# Patient Record
Sex: Female | Born: 1935 | Race: White | Hispanic: No | Smoking: Never smoker
Health system: Southern US, Community
[De-identification: ages and names within clinical notes are randomized; demographics above are authoritative.]

## PROBLEM LIST (undated history)

## (undated) DIAGNOSIS — R627 Adult failure to thrive: Secondary | ICD-10-CM

## (undated) DIAGNOSIS — I10 Essential (primary) hypertension: Secondary | ICD-10-CM

## (undated) DIAGNOSIS — R0602 Shortness of breath: Secondary | ICD-10-CM

## (undated) DIAGNOSIS — F32A Depression, unspecified: Secondary | ICD-10-CM

## (undated) DIAGNOSIS — W19XXXA Unspecified fall, initial encounter: Secondary | ICD-10-CM

## (undated) DIAGNOSIS — I639 Cerebral infarction, unspecified: Secondary | ICD-10-CM

## (undated) DIAGNOSIS — I4891 Unspecified atrial fibrillation: Secondary | ICD-10-CM

## (undated) DIAGNOSIS — J189 Pneumonia, unspecified organism: Secondary | ICD-10-CM

## (undated) DIAGNOSIS — I499 Cardiac arrhythmia, unspecified: Secondary | ICD-10-CM

## (undated) DIAGNOSIS — M1990 Unspecified osteoarthritis, unspecified site: Secondary | ICD-10-CM

## (undated) DIAGNOSIS — F0391 Unspecified dementia with behavioral disturbance: Secondary | ICD-10-CM

## (undated) DIAGNOSIS — K219 Gastro-esophageal reflux disease without esophagitis: Secondary | ICD-10-CM

## (undated) DIAGNOSIS — N179 Acute kidney failure, unspecified: Secondary | ICD-10-CM

## (undated) DIAGNOSIS — F329 Major depressive disorder, single episode, unspecified: Secondary | ICD-10-CM

## (undated) DIAGNOSIS — D649 Anemia, unspecified: Secondary | ICD-10-CM

## (undated) DIAGNOSIS — I951 Orthostatic hypotension: Secondary | ICD-10-CM

## (undated) HISTORY — PX: BACK SURGERY: SHX140

## (undated) HISTORY — PX: OTHER SURGICAL HISTORY: SHX169

---

## 2009-09-22 ENCOUNTER — Emergency Department (HOSPITAL_COMMUNITY): Admission: EM | Admit: 2009-09-22 | Discharge: 2009-09-22 | Payer: Self-pay | Admitting: Emergency Medicine

## 2009-10-01 ENCOUNTER — Ambulatory Visit (HOSPITAL_COMMUNITY): Admission: RE | Admit: 2009-10-01 | Discharge: 2009-10-01 | Payer: Self-pay | Admitting: Family Medicine

## 2009-11-04 ENCOUNTER — Inpatient Hospital Stay (HOSPITAL_COMMUNITY): Admission: EM | Admit: 2009-11-04 | Discharge: 2009-11-09 | Payer: Self-pay | Admitting: Emergency Medicine

## 2009-11-06 ENCOUNTER — Ambulatory Visit: Payer: Self-pay | Admitting: Gastroenterology

## 2009-11-08 ENCOUNTER — Ambulatory Visit: Payer: Self-pay | Admitting: Gastroenterology

## 2009-11-08 ENCOUNTER — Encounter (INDEPENDENT_AMBULATORY_CARE_PROVIDER_SITE_OTHER): Payer: Self-pay | Admitting: Family Medicine

## 2009-11-17 ENCOUNTER — Encounter (INDEPENDENT_AMBULATORY_CARE_PROVIDER_SITE_OTHER): Payer: Self-pay

## 2009-12-01 ENCOUNTER — Encounter (INDEPENDENT_AMBULATORY_CARE_PROVIDER_SITE_OTHER): Payer: Self-pay

## 2009-12-30 ENCOUNTER — Encounter: Payer: Self-pay | Admitting: Gastroenterology

## 2010-01-19 ENCOUNTER — Ambulatory Visit: Payer: Self-pay | Admitting: Gastroenterology

## 2010-01-19 DIAGNOSIS — D649 Anemia, unspecified: Secondary | ICD-10-CM | POA: Insufficient documentation

## 2010-01-19 DIAGNOSIS — K279 Peptic ulcer, site unspecified, unspecified as acute or chronic, without hemorrhage or perforation: Secondary | ICD-10-CM | POA: Insufficient documentation

## 2010-01-19 DIAGNOSIS — R197 Diarrhea, unspecified: Secondary | ICD-10-CM | POA: Insufficient documentation

## 2010-01-28 ENCOUNTER — Ambulatory Visit: Payer: Self-pay | Admitting: Gastroenterology

## 2010-01-28 ENCOUNTER — Ambulatory Visit (HOSPITAL_COMMUNITY): Admission: RE | Admit: 2010-01-28 | Discharge: 2010-01-28 | Payer: Self-pay | Admitting: Gastroenterology

## 2010-05-12 ENCOUNTER — Ambulatory Visit (HOSPITAL_COMMUNITY)
Admission: RE | Admit: 2010-05-12 | Discharge: 2010-05-12 | Payer: Self-pay | Source: Home / Self Care | Attending: Family Medicine | Admitting: Family Medicine

## 2010-06-14 NOTE — Letter (Signed)
Summary: Plan of Care, Need to Discuss  [facility]  [address]   [place], [place] [address]   Phone: [phone]  Fax: [phone]    December 01, 2009  Sheri Matthews [address] [place], [place]  [address] 11/16/1923   Dear Ms. Sheri Matthews,   We are writing this letter to inform you of treatment plans and/or discuss your plan of care.  We have tried several times to contact you; however, we have yet to reach you.  We ask that you please contact our office for follow-up on your gastrointestinal issues.  We can  be reached at [phone] to schedule an appointment, or to speak with someone regarding your health care needs.  Please do not neglect your health.   Sincerely,    Cloria Spring LPN  [facility] Ph: [phone]    Fax: [phone]

## 2010-06-14 NOTE — Letter (Signed)
Summary: Plan of Care, Need to Discuss  [facility]  [address]   [place], [place] [address]   Phone: [phone]  Fax: [phone]    November 17, 2009  Sheri Matthews [address] [place], [place]  [address] 12/25/1923   Dear Ms. Sheri Matthews,   We are writing this letter to inform you of treatment plans and/or discuss your plan of care.  We have tried several times to contact you; however, we have yet to reach you.  We ask that you please contact our office for follow-up on your gastrointestinal issues.  We can  be reached at [phone] to schedule an appointment, or to speak with someone regarding your health care needs.  Please do not neglect your health.   Sincerely,    Cloria Spring LPN  [facility] Ph: [phone]    Fax: [phone]

## 2010-06-14 NOTE — Letter (Signed)
Summary: CONSULTATION  CONSULTATION   Imported By: Rexene Alberts 12/30/2009 10:28:26  _____________________________________________________________________  External Attachment:    Type:   Image     Comment:   External Document

## 2010-06-14 NOTE — Letter (Signed)
Summary: EGD ORDER  EGD ORDER   Imported By: Ave Filter 01/19/2010 11:53:13  _____________________________________________________________________  External Attachment:    Type:   Image     Comment:   External Document

## 2010-06-14 NOTE — Assessment & Plan Note (Signed)
Summary: PUD, DIARRHEA   Visit Type:  Follow-up Visit Primary Care Provider:  Janna Arch, M.D.   Chief Complaint:  hosp follow up-doing good.  History of Present Illness: Last labs AUG. No BRBPR or melena. No nausea, vomiting, or pain. Eating okay. No problems swallowing.  Was down to 125 lbs but put some back on. 2 bouts of diarrhea and required anti-diarrheal meds.  Preventive Screening-Counseling & Management  Alcohol-Tobacco     Smoking Status: never  Current Medications (verified): 1)  Pantoprazole Sodium 40 Mg Tbec (Pantoprazole Sodium) .... Once Daily 2)  Lorazepam 1 Mg Tabs (Lorazepam) .... At Bedtime 3)  Donepezil Hcl 10 Mg Tabs (Donepezil Hcl) .... At Bedtime 4)  Simvastatin 40 Mg Tabs (Simvastatin) .... Once Daily 5)  Benazepril Hcl 20 Mg Tabs (Benazepril Hcl) .... Two Times A Day 6)  Amlodipine Besylate 5 Mg Tabs (Amlodipine Besylate) .... Once Daily 7)  Metoprolol Tartrate 50 Mg Tabs (Metoprolol Tartrate) .... Two Times A Day 8)  Fluoxetine Hcl 20 Mg Caps (Fluoxetine Hcl) .... Once Daily  Allergies (verified): No Known Drug Allergies  Past History:  Past Medical History: ANEMIA on ASA and Naproxen **JUNE 2011 TCS/EGD-PUD, HH, DISTAL PEPTIC STRICTURE, [place] TICS, IH, TORTUOUS COLON, NSAID COLOPATHY Hypertension HA TIA VERTIGO BELL'S PALSY  Past Surgical History: Cataract Extraction ONE OVARY REMOVED  Family History: No FH of Colon Cancer OR POLYPS  Social History: Patient has never smoked.  Alcohol Use - no LIVES WITH DAUGHTER Smoking Status:  never  Review of Systems       AUG 2011-WBC 7.6 HB 12.2 PLT 356 HFP NL  Vital Signs:  Patient profile:   77 year old female Height:      61 inches Weight:      132 pounds Temp:     98.2 degrees F oral Pulse rate:   76 / minute BP sitting:   112 / 68  (left arm) Cuff size:   regular  Vitals Entered By: Hendricks Limes LPN (January 19, 2010 11:21 AM)  Physical Exam  General:  Well developed, well  nourished, no acute distress. Head:  Normocephalic and atraumatic. Lungs:  Clear throughout to auscultation. Heart:  Regular rate and rhythm; no murmurs. Abdomen:  Soft, nontender and nondistended. Normal bowel sounds. Extremities:  Noedema noted.  Impression & Recommendations:  Problem # 1:  ANEMIA (ICD-285.9) 2o to PUD. EGD in SEP 2011. Pt may use ASA 81 mg daily if continues using pantoprazole. Upper endoscopy in SEP 2011. Follow up in 6 mos.  Problem # 2:  DIARRHEA (ICD-787.91) Assessment: Improved Intermittent likely 2o to lactose intoleramce or dietary choices. Minimize dairy.  CC: PCP  Patient Instructions: 1)  Pt may use ASA 81 mg daily if continues using pantoprazole. 2)  Upper endoscopy in SEP 2011. 3)  Minimize dairy. 4)  Follow up in 6 mos. 5)  The medication list was reviewed and reconciled.  All changed / newly prescribed medications were explained.  A complete medication list was provided to the patient / caregiver.  Appended Document: PUD, DIARRHEA 6 MONTH F/U OPV IS IN THE COMPUTER  Appended Document: Orders Update    Clinical Lists Changes  Problems: Added new problem of PUD (ICD-533.90) Orders: Added new Service order of Est. Patient Level III ([address]) - Signed

## 2010-06-30 ENCOUNTER — Other Ambulatory Visit (HOSPITAL_COMMUNITY): Payer: Self-pay | Admitting: Neurosurgery

## 2010-06-30 DIAGNOSIS — M546 Pain in thoracic spine: Secondary | ICD-10-CM

## 2010-07-01 ENCOUNTER — Ambulatory Visit (HOSPITAL_COMMUNITY)
Admission: RE | Admit: 2010-07-01 | Discharge: 2010-07-01 | Disposition: A | Discharge status: Home / Self Care | Payer: Medicare Other | Source: Ambulatory Visit | Attending: Neurosurgery | Admitting: Neurosurgery

## 2010-07-01 ENCOUNTER — Other Ambulatory Visit (HOSPITAL_COMMUNITY): Payer: Self-pay | Admitting: Neurosurgery

## 2010-07-01 DIAGNOSIS — M546 Pain in thoracic spine: Secondary | ICD-10-CM

## 2010-07-01 DIAGNOSIS — M47814 Spondylosis without myelopathy or radiculopathy, thoracic region: Secondary | ICD-10-CM | POA: Insufficient documentation

## 2010-07-05 ENCOUNTER — Ambulatory Visit (HOSPITAL_COMMUNITY)
Admission: RE | Admit: 2010-07-05 | Discharge: 2010-07-05 | Disposition: A | Discharge status: Home / Self Care | Payer: Medicare Other | Source: Ambulatory Visit | Attending: Neurosurgery | Admitting: Neurosurgery

## 2010-07-05 DIAGNOSIS — M818 Other osteoporosis without current pathological fracture: Secondary | ICD-10-CM | POA: Insufficient documentation

## 2010-07-21 ENCOUNTER — Encounter (INDEPENDENT_AMBULATORY_CARE_PROVIDER_SITE_OTHER): Payer: Self-pay | Admitting: *Deleted

## 2010-07-26 NOTE — Letter (Signed)
Summary: Recall Office Visit  [facility]  [address]   [place], [place] [address]   Phone: [phone]  Fax: [phone]      July 21, 2010   RENELDA KILIAN [address] [place], [place]  [address] [birth year]   Dear Ms. TIGGES,   According to our records, it is time for you to schedule a follow-up office visit with [place].   At your convenience, please call [phone] to schedule an office visit. If you have any questions, concerns, or feel that this letter is in error, we would appreciate your call.   Sincerely,    Diana Eves  [facility] Ph: [phone]   Fax: [phone]

## 2010-07-31 LAB — COMPREHENSIVE METABOLIC PANEL
ALT: 8 U/L (ref 0–35)
AST: 14 U/L (ref 0–37)
Alkaline Phosphatase: 58 U/L (ref 39–117)
CO2: 24 mEq/L (ref 19–32)
Calcium: 8.1 mg/dL — ABNORMAL LOW (ref 8.4–10.5)
Chloride: 110 mEq/L (ref 96–112)
GFR calc non Af Amer: 24 mL/min — ABNORMAL LOW (ref >60)
Glucose, Bld: 94 mg/dL (ref 70–99)
Potassium: 4.5 mEq/L (ref 3.5–5.1)
Sodium: 139 mEq/L (ref 135–145)
Total Bilirubin: 0.6 mg/dL (ref 0.3–1.2)

## 2010-07-31 LAB — TYPE AND SCREEN

## 2010-07-31 LAB — BASIC METABOLIC PANEL
BUN: 10 mg/dL (ref 6–23)
Calcium: 8.4 mg/dL (ref 8.4–10.5)
Chloride: 114 mEq/L — ABNORMAL HIGH (ref 96–112)
GFR calc non Af Amer: 46 mL/min — ABNORMAL LOW (ref >60)
GFR calc non Af Amer: >60 mL/min (ref >60)
Potassium: 3.7 mEq/L (ref 3.5–5.1)
Potassium: 3.9 mEq/L (ref 3.5–5.1)
Sodium: 143 mEq/L (ref 135–145)
Sodium: 143 mEq/L (ref 135–145)

## 2010-07-31 LAB — DIFFERENTIAL
Basophils Absolute: 0 10*3/uL (ref 0.0–0.1)
Basophils Absolute: 0 10*3/uL (ref 0.0–0.1)
Basophils Relative: 0 % (ref 0–1)
Eosinophils Absolute: 0.2 10*3/uL (ref 0.0–0.7)
Eosinophils Relative: 4 % (ref 0–5)
Eosinophils Relative: 5 % (ref 0–5)
Lymphocytes Relative: 22 % (ref 12–46)
Lymphocytes Relative: 31 % (ref 12–46)
Lymphs Abs: 2.2 10*3/uL (ref 0.7–4.0)
Monocytes Absolute: 0.6 10*3/uL (ref 0.1–1.0)
Monocytes Absolute: 0.7 10*3/uL (ref 0.1–1.0)
Monocytes Relative: 10 % (ref 3–12)
Monocytes Relative: 10 % (ref 3–12)
Neutro Abs: 3.7 10*3/uL (ref 1.7–7.7)
Neutro Abs: 4 10*3/uL (ref 1.7–7.7)
Neutrophils Relative %: 63 % (ref 43–77)
Neutrophils Relative %: 69 % (ref 43–77)

## 2010-07-31 LAB — URINALYSIS, ROUTINE W REFLEX MICROSCOPIC
Glucose, UA: NEGATIVE mg/dL
Ketones, ur: NEGATIVE mg/dL
Specific Gravity, Urine: 1.02 (ref 1.005–1.030)
pH: 5 (ref 5.0–8.0)

## 2010-07-31 LAB — FOLATE: Folate: 15.3 ng/mL

## 2010-07-31 LAB — RETICULOCYTES: Retic Count, Absolute: 43.2 10*3/uL (ref 19.0–186.0)

## 2010-07-31 LAB — IRON AND TIBC
Iron: 11 ug/dL — ABNORMAL LOW (ref 42–135)
Saturation Ratios: 4 % — ABNORMAL LOW (ref 20–55)
TIBC: 249 ug/dL — ABNORMAL LOW (ref 250–470)

## 2010-07-31 LAB — POCT CARDIAC MARKERS: Myoglobin, poc: 175 ng/mL (ref 12–200)

## 2010-07-31 LAB — CBC
HCT: 23.9 % — ABNORMAL LOW (ref 36.0–46.0)
HCT: 24.2 % — ABNORMAL LOW (ref 36.0–46.0)
HCT: 31.6 % — ABNORMAL LOW (ref 36.0–46.0)
Hemoglobin: 10.7 g/dL — ABNORMAL LOW (ref 12.0–15.0)
Hemoglobin: 8.1 g/dL — ABNORMAL LOW (ref 12.0–15.0)
Hemoglobin: 8.2 g/dL — ABNORMAL LOW (ref 12.0–15.0)
MCHC: 33.7 g/dL (ref 30.0–36.0)
MCV: 91.3 fL (ref 78.0–100.0)
RBC: 2.56 MIL/uL — ABNORMAL LOW (ref 3.87–5.11)
RDW: 13.1 % (ref 11.5–15.5)
RDW: 14.8 % (ref 11.5–15.5)
WBC: 5.9 10*3/uL (ref 4.0–10.5)
WBC: 7.3 10*3/uL (ref 4.0–10.5)

## 2010-07-31 LAB — ABO/RH: ABO/RH(D): O POS

## 2010-07-31 LAB — PREPARE RBC (CROSSMATCH)

## 2010-08-02 LAB — COMPREHENSIVE METABOLIC PANEL
Albumin: 3.6 g/dL (ref 3.5–5.2)
Alkaline Phosphatase: 74 U/L (ref 39–117)
BUN: 15 mg/dL (ref 6–23)
Creatinine, Ser: 1.1 mg/dL (ref 0.4–1.2)
Glucose, Bld: 94 mg/dL (ref 70–99)
Potassium: 3.9 mEq/L (ref 3.5–5.1)
Total Bilirubin: 0.7 mg/dL (ref 0.3–1.2)
Total Protein: 6.5 g/dL (ref 6.0–8.3)

## 2010-08-02 LAB — CBC
HCT: 34.3 % — ABNORMAL LOW (ref 36.0–46.0)
Hemoglobin: 11.7 g/dL — ABNORMAL LOW (ref 12.0–15.0)
MCHC: 34 g/dL (ref 30.0–36.0)
MCV: 91.5 fL (ref 78.0–100.0)
Platelets: 270 10*3/uL (ref 150–400)
RDW: 12.3 % (ref 11.5–15.5)

## 2010-09-15 ENCOUNTER — Emergency Department (HOSPITAL_COMMUNITY): Payer: Medicare Other

## 2010-09-15 ENCOUNTER — Encounter (HOSPITAL_COMMUNITY): Payer: Self-pay | Admitting: Radiology

## 2010-09-15 ENCOUNTER — Inpatient Hospital Stay (HOSPITAL_COMMUNITY)
Admission: EM | Admit: 2010-09-15 | Discharge: 2010-09-17 | DRG: 202 | Disposition: A | Discharge status: Home / Self Care | Payer: Medicare Other | Attending: Family Medicine | Admitting: Family Medicine

## 2010-09-15 DIAGNOSIS — F411 Generalized anxiety disorder: Secondary | ICD-10-CM | POA: Diagnosis present

## 2010-09-15 DIAGNOSIS — E785 Hyperlipidemia, unspecified: Secondary | ICD-10-CM | POA: Diagnosis present

## 2010-09-15 DIAGNOSIS — D638 Anemia in other chronic diseases classified elsewhere: Secondary | ICD-10-CM | POA: Diagnosis present

## 2010-09-15 DIAGNOSIS — J45901 Unspecified asthma with (acute) exacerbation: Principal | ICD-10-CM | POA: Diagnosis present

## 2010-09-15 DIAGNOSIS — R0989 Other specified symptoms and signs involving the circulatory and respiratory systems: Secondary | ICD-10-CM | POA: Diagnosis present

## 2010-09-15 DIAGNOSIS — F0390 Unspecified dementia without behavioral disturbance: Secondary | ICD-10-CM | POA: Diagnosis present

## 2010-09-15 DIAGNOSIS — F329 Major depressive disorder, single episode, unspecified: Secondary | ICD-10-CM | POA: Diagnosis present

## 2010-09-15 DIAGNOSIS — F3289 Other specified depressive episodes: Secondary | ICD-10-CM | POA: Diagnosis present

## 2010-09-15 DIAGNOSIS — N390 Urinary tract infection, site not specified: Secondary | ICD-10-CM | POA: Diagnosis present

## 2010-09-15 HISTORY — DX: Essential (primary) hypertension: I10

## 2010-09-15 LAB — DIFFERENTIAL
Basophils Absolute: 0 10*3/uL (ref 0.0–0.1)
Lymphocytes Relative: 23 % (ref 12–46)
Lymphs Abs: 1.9 10*3/uL (ref 0.7–4.0)
Neutro Abs: 4.8 10*3/uL (ref 1.7–7.7)

## 2010-09-15 LAB — URINALYSIS, ROUTINE W REFLEX MICROSCOPIC
Glucose, UA: NEGATIVE mg/dL
Hgb urine dipstick: NEGATIVE
Protein, ur: NEGATIVE mg/dL
Specific Gravity, Urine: 1.02 (ref 1.005–1.030)
Urobilinogen, UA: 0.2 mg/dL (ref 0.0–1.0)

## 2010-09-15 LAB — COMPREHENSIVE METABOLIC PANEL
CO2: 20 mEq/L (ref 19–32)
Calcium: 9 mg/dL (ref 8.4–10.5)
Creatinine, Ser: 1.58 mg/dL — ABNORMAL HIGH (ref 0.4–1.2)
GFR calc Af Amer: 38 mL/min — ABNORMAL LOW (ref >60)
GFR calc non Af Amer: 31 mL/min — ABNORMAL LOW (ref >60)
Glucose, Bld: 102 mg/dL — ABNORMAL HIGH (ref 70–99)
Total Protein: 5.9 g/dL — ABNORMAL LOW (ref 6.0–8.3)

## 2010-09-15 LAB — CBC
HCT: 29.4 % — ABNORMAL LOW (ref 36.0–46.0)
Hemoglobin: 9.5 g/dL — ABNORMAL LOW (ref 12.0–15.0)
MCV: 95.8 fL (ref 78.0–100.0)
RBC: 3.07 MIL/uL — ABNORMAL LOW (ref 3.87–5.11)
WBC: 8.4 10*3/uL (ref 4.0–10.5)

## 2010-09-15 LAB — URINE MICROSCOPIC-ADD ON

## 2010-09-15 LAB — PRO B NATRIURETIC PEPTIDE: Pro B Natriuretic peptide (BNP): 3571 pg/mL — ABNORMAL HIGH (ref 0–450)

## 2010-09-16 ENCOUNTER — Inpatient Hospital Stay (HOSPITAL_COMMUNITY): Payer: Medicare Other

## 2010-09-16 DIAGNOSIS — I369 Nonrheumatic tricuspid valve disorder, unspecified: Secondary | ICD-10-CM

## 2010-09-16 LAB — BASIC METABOLIC PANEL
CO2: 19 mEq/L (ref 19–32)
Chloride: 111 mEq/L (ref 96–112)
GFR calc non Af Amer: 43 mL/min — ABNORMAL LOW (ref >60)
Glucose, Bld: 151 mg/dL — ABNORMAL HIGH (ref 70–99)
Potassium: 4.6 mEq/L (ref 3.5–5.1)
Sodium: 141 mEq/L (ref 135–145)

## 2010-09-16 LAB — URINE CULTURE
Colony Count: NO GROWTH
Culture  Setup Time: 201205040221

## 2010-09-16 LAB — TSH: TSH: 1.126 u[IU]/mL (ref 0.350–4.500)

## 2010-09-16 LAB — IRON AND TIBC
Saturation Ratios: 6 % — ABNORMAL LOW (ref 20–55)
TIBC: 276 ug/dL (ref 250–470)
UIBC: 259 ug/dL

## 2010-09-17 LAB — CBC
Hemoglobin: 9 g/dL — ABNORMAL LOW (ref 12.0–15.0)
MCH: 29.9 pg (ref 26.0–34.0)
MCHC: 31.9 g/dL (ref 30.0–36.0)

## 2010-09-17 LAB — DIFFERENTIAL
Basophils Relative: 0 % (ref 0–1)
Eosinophils Absolute: 0 10*3/uL (ref 0.0–0.7)
Monocytes Absolute: 0.7 10*3/uL (ref 0.1–1.0)
Monocytes Relative: 5 % (ref 3–12)
Neutro Abs: 12.6 10*3/uL — ABNORMAL HIGH (ref 1.7–7.7)

## 2010-09-22 NOTE — Discharge Summary (Signed)
  NAME:  Sheri Matthews, Sheri Matthews NO.:  [id number]  MEDICAL RECORD NO.:  [id number]           PATIENT TYPE:  I  LOCATION:  A331                          FACILITY:  [facility]  PHYSICIAN:  Melvyn Novas, MDDATE OF BIRTH:  1923-07-25  DATE OF ADMISSION:  09/15/2010 DATE OF DISCHARGE:  05/05/2012LH                              DISCHARGE SUMMARY   The patient is an 86-year white female who lives in an assisted living. The patient had some increasing cough and some chronic vertigo sensation.  She was summoned to the ER and found to have an elevated BNP of 2000-3000.  There has been recent lab error in regards to this. Chest x-ray shows no evidence of fluid extravasation or volume overload. She was hemodynamically stable.  Blood pressure systolic between 161 and 120s well controlled.  The patient was subsequently admitted, treated for asthmatic bronchitis exacerbation presumably due to pollen and put on Solu-Medrol 125 IV q.8 h. for 48 hours.  She had DuoNeb nebulizers and continued to well.  She had a workup by 2-D echo revealing normal systolic function, mildly enlarged left atrial chamber dimension, no significant stenotic or regurgitant valvular lesions.  Carotid ultrasound was done as done previously in the office 8 or 10 months ago revealing a 50 to 70% right carotid artery stenosis.  Left side apparently has no hemodynamically significant lesions.  This was discussed in office, will be discussed as an outpatient.  While in hospital, she was also found to have a UTI treated with IV Cipro and then subsequently Cipro 500 p.o. b.i.d. for 3 additional days as an outpatient.  She likewise had an anemia with hemoglobin of 9 and stools for occult blood were essentially negative and anemia profile revealed low iron saturations and percent saturations and normal TIBC, normal folate and B12 consistent with anemia of chronic disease.  We will ascertain whether she has had a  recent colonoscopy.  The patient does not know the answer to this. We will check medical records in the office.  Basically, the patient is subsequently discharged today. Hemodynamically stable to be discharged on the following medicines: 1. Aspirin 81 mg p.o. daily. 2. Cipro 500 p.o. b.i.d. for 3 additional days. 3. Crestor 10 mg p.o. daily. 4. Prednisone 20 mg p.o. daily for 6 days and discontinue. 5. Amlodipine 5 mg p.o. daily. 6. Aricept 10 mg p.o. daily. 7. Benazepril 20 mg p.o. b.i.d. 8. Fluoxetine 20 mg p.o. daily. 9. Meclizine 25 mg p.o. at bedtime for vertigo. 10.Metoprolol 50 mg p.o. b.i.d. 11.Oxycodone 5/325 p.o. t.i.d. p.r.n. for arthritic pain and     headaches.  The patient will follow up in the office in 3 days' time.     Melvyn Novas, MD     RMD/MEDQ  D:  09/17/2010  T:  09/17/2010  Job:  [id number]  Electronically Signed by Oval Linsey MD on 09/22/2010 03:10:02 PM

## 2010-09-22 NOTE — H&P (Signed)
NAME:  Sheri Matthews, Sheri Matthews                ACCOUNT NO.:  [id number]  MEDICAL RECORD NO.:  [id number]           PATIENT TYPE:  I  LOCATION:  A331                          FACILITY:  [facility]  PHYSICIAN:  Melvyn Novas, MDDATE OF BIRTH:  09-28-1923  DATE OF ADMISSION:  09/15/2010 DATE OF DISCHARGE:  LH                             HISTORY & PHYSICAL   HISTORY OF PRESENT ILLNESS:  The patient is a 77 year old white female who has multiple somatic complaints, complaining of some increasing coughing, some mild wheezing after coughing for 24 hour period, was seen in ER, found to have a UTI, found to have a hemoglobin of 9.8 which has changed from hemoglobin in December 2011 of 12 and has chronic pedal edema for which she was prescribed compression hose which she never did obtain.  She denies any anginal chest pain, however, BNP is in excess of 4000 which may be lab error.  Chest x-ray is consistent with chronic bronchitic changes without any evidence of fluid or volume overload. Her LV function is unknown at present.  She denies angina, angina equivalent, orthopnea, or PND.  She states she is bothered by pollen allergies every spring.  The patient is admitted to obtain 2-D echocardiogram, to assess LV function, repeat BMP to verify validity, assess renal function, and to treat UTI and as well as to workup anemia with anemia profile and stools for occult blood.  She will be treated with IV Cipro.  PAST MEDICAL HISTORY:  Significant for hypertension, hyperlipidemia, mild dementia, anxiety, depression, insomnia, and peripheral arterial disease with carotid bruit on the right which will be worked up.  PAST SURGICAL HISTORY:  Unremarkable.  SOCIAL HISTORY:  She lives with daughter, smoked a pack a day for 30 years, quit 15 years ago.  Does ambulate.  Has chronic vertigo.  CURRENT MEDICINES: 1. Benazepril 20 mg p.o. b.i.d. 2. Norvasc 5 mg p.o. daily. 3. Protonix 40 mg p.o. daily. 4. Azor  10 mg p.o. at bedtime. 5. Zocor 40 mg p.o. daily. 6. Prozac 20 mg p.o. daily. 7. Lorazepam 1 mg p.o. at bedtime p.r.n. for sleep. 8. Meclizine 25 mg p.o. t.i.d. p.r.n. for vertigo.  LABORATORY DATA:  She has 11-20 white cells in the urine with rare bacteria.  Chest x-ray shows bronchitic changes with borderline heart size, creatinine is 1.58, and hemoglobin was 9.5 which is diminished from December of 11, BNP is 3571 significantly elevated.  IMPRESSION: 1. Urinary tract infection. 2. Some cough and bronchitic changes possibly asthmatic bronchitis. 3. Elevated BNP, unknown left ventricular function. 4. Chronic renal failure, creatinine of 1.58. 5. Hypertension. 6. Chronic pedal edema.  PLAN:  At present is to obtain 2-D echocardiogram, carotid ultrasound, continue all blood pressure medicines, TED hose for chronic pedal edema, possibly induced by Norvasc and continue all the current medicines.  Add Cipro 500 p.o. b.i.d., Lovenox, and I will make further recommendations as the database expands.     Melvyn Novas, MD     RMD/MEDQ  D:  09/15/2010  T:  09/16/2010  Job:  [id number]  Electronically Signed by Oval Linsey MD on 09/22/2010  03:09:59 PM

## 2011-03-16 ENCOUNTER — Emergency Department (HOSPITAL_COMMUNITY)
Admission: EM | Admit: 2011-03-16 | Discharge: 2011-03-16 | Disposition: A | Discharge status: Home / Self Care | Payer: Medicare Other | Attending: Emergency Medicine | Admitting: Emergency Medicine

## 2011-03-16 ENCOUNTER — Encounter (HOSPITAL_COMMUNITY): Payer: Self-pay | Admitting: Emergency Medicine

## 2011-03-16 DIAGNOSIS — I10 Essential (primary) hypertension: Secondary | ICD-10-CM | POA: Insufficient documentation

## 2011-03-16 DIAGNOSIS — M159 Polyosteoarthritis, unspecified: Secondary | ICD-10-CM | POA: Insufficient documentation

## 2011-03-16 DIAGNOSIS — M1990 Unspecified osteoarthritis, unspecified site: Secondary | ICD-10-CM

## 2011-03-16 HISTORY — DX: Unspecified osteoarthritis, unspecified site: M19.90

## 2011-03-16 MED ORDER — PREDNISONE 50 MG PO TABS: 50.0000 mg | ORAL_TABLET | Freq: Every day | ORAL | Status: AC

## 2011-03-16 MED ORDER — KETOROLAC TROMETHAMINE 60 MG/2ML IM SOLN
60.0000 mg | Freq: Once | INTRAMUSCULAR | Status: AC
  Administered 2011-03-16: 60 mg via INTRAMUSCULAR
  Filled 2011-03-16: qty 2

## 2011-03-16 MED ORDER — NAPROXEN 500 MG PO TABS: 500.0000 mg | ORAL_TABLET | Freq: Two times a day (BID) | ORAL | Status: DC

## 2011-03-16 MED ORDER — OXYCODONE-ACETAMINOPHEN 5-325 MG PO TABS: 2.0000 | ORAL_TABLET | ORAL | Status: AC | PRN

## 2011-03-16 NOTE — ED Notes (Signed)
Pt up walking around ed. Pt has steady gate. Pt states she needed to walk because she hurt more to sit.

## 2011-03-16 NOTE — ED Notes (Signed)
Pt states that she has generalized body aches.  Pt has chronic pain.

## 2011-03-16 NOTE — ED Notes (Signed)
Pt c/o pain to much of body d/t arthritic pain.

## 2011-03-16 NOTE — ED Provider Notes (Signed)
History   This chart was scribed for Donnetta Hutching, MD by Clarita Crane. The patient was seen in room APA03/APA03 and the patient's care was started at 12:29PM.   CSN: [id number] Arrival date & time: 03/16/2011 11:17 AM   First MD Initiated Contact with Patient 03/16/11 1222      Chief Complaint  Patient presents with  . Generalized Body Aches    Pt with chronic generalized pain.    (Consider location/radiation/quality/duration/timing/severity/associated sxs/prior treatment) HPI Sheri Matthews is a 77 y.o. female who presents to the Emergency Department complaining of constant moderate non-radiating pain to bilateral upper and lower extremities onset several days ago and persistent since with no associated symptoms. Patient notes experiencing similar symptoms previously which she attributes to worsening arthritis. Patient has a h/o hypertension and arthritis.   Past Medical History  Diagnosis Date  . Hypertension   . Arthritis     Past Surgical History  Procedure Date  . Back surgery   . Ovaries removed    No family history on file.  History  Substance Use Topics  . Smoking status: Never Smoker   . Smokeless tobacco: Not on file  . Alcohol Use: No    OB History    Grav Para Term Preterm Abortions TAB SAB Ect Mult Living                  Review of Systems 10 Systems reviewed and are negative for acute change except as noted in the HPI.  Allergies  Review of patient's allergies indicates no known allergies.  Home Medications  No current outpatient prescriptions on file.  BP 176/70  Temp(Src) 98.4 F (36.9 C) (Oral)  Resp 20  Ht 5\' 1"  (1.549 m)  Wt 133 lb (60.328 kg)  BMI 25.13 kg/m2  SpO2 98%  Physical Exam  Nursing note and vitals reviewed. Constitutional: She is oriented to person, place, and time. She appears well-developed and well-nourished. No distress.  HENT:  Head: Normocephalic and atraumatic.  Eyes: EOM are normal. Pupils are equal, round, and  reactive to light.  Neck: Neck supple. No tracheal deviation present.  Cardiovascular: Normal rate.   Pulmonary/Chest: Effort normal. No respiratory distress.  Abdominal: She exhibits no distension.  Musculoskeletal: Normal range of motion. She exhibits no edema.       Mild pain with ROM of bilateral upper and lower extremities.   Neurological: She is alert and oriented to person, place, and time. No sensory deficit.  Skin: Skin is warm and dry.  Psychiatric: She has a normal mood and affect. Her behavior is normal.    ED Course  Procedures (including critical care time)  DIAGNOSTIC STUDIES: Oxygen Saturation is 98% on room air, normal by my interpretation.    COORDINATION OF CARE: 12:32PM- Patient informed of intent to d/c following administering Toradol-IM and will prescribe Prednisone and additional anti-inflammatory medications   Labs Reviewed - No data to display No results found.   No diagnosis found.    MDM     Toradol IM- Percocet, Prednisone and Naprosyn.    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 c/o of generalized joint pain.  Symptoms are worse in cold weather. No diagnostic testing necessary. Rx Norco, Naprosyn, prednisone.      Donnetta Hutching, MD 03/16/11 1341

## 2011-03-28 ENCOUNTER — Emergency Department (HOSPITAL_COMMUNITY): Payer: Medicare Other

## 2011-03-28 ENCOUNTER — Inpatient Hospital Stay (HOSPITAL_COMMUNITY)
Admission: EM | Admit: 2011-03-28 | Discharge: 2011-04-11 | DRG: 308 | Disposition: A | Discharge status: Skilled Nursing Facility | Payer: Medicare Other | Attending: Family Medicine | Admitting: Family Medicine

## 2011-03-28 ENCOUNTER — Encounter (HOSPITAL_COMMUNITY): Payer: Self-pay | Admitting: Emergency Medicine

## 2011-03-28 ENCOUNTER — Other Ambulatory Visit: Payer: Self-pay

## 2011-03-28 DIAGNOSIS — D649 Anemia, unspecified: Secondary | ICD-10-CM | POA: Diagnosis not present

## 2011-03-28 DIAGNOSIS — I4891 Unspecified atrial fibrillation: Principal | ICD-10-CM | POA: Diagnosis present

## 2011-03-28 DIAGNOSIS — M810 Age-related osteoporosis without current pathological fracture: Secondary | ICD-10-CM | POA: Diagnosis present

## 2011-03-28 DIAGNOSIS — J449 Chronic obstructive pulmonary disease, unspecified: Secondary | ICD-10-CM | POA: Diagnosis present

## 2011-03-28 DIAGNOSIS — I10 Essential (primary) hypertension: Secondary | ICD-10-CM | POA: Diagnosis present

## 2011-03-28 DIAGNOSIS — E876 Hypokalemia: Secondary | ICD-10-CM | POA: Diagnosis not present

## 2011-03-28 DIAGNOSIS — J4489 Other specified chronic obstructive pulmonary disease: Secondary | ICD-10-CM | POA: Diagnosis present

## 2011-03-28 DIAGNOSIS — R55 Syncope and collapse: Secondary | ICD-10-CM

## 2011-03-28 DIAGNOSIS — M1990 Unspecified osteoarthritis, unspecified site: Secondary | ICD-10-CM | POA: Diagnosis present

## 2011-03-28 DIAGNOSIS — Z9181 History of falling: Secondary | ICD-10-CM

## 2011-03-28 DIAGNOSIS — J189 Pneumonia, unspecified organism: Secondary | ICD-10-CM | POA: Diagnosis not present

## 2011-03-28 DIAGNOSIS — E785 Hyperlipidemia, unspecified: Secondary | ICD-10-CM | POA: Diagnosis present

## 2011-03-28 HISTORY — DX: Major depressive disorder, single episode, unspecified: F32.9

## 2011-03-28 HISTORY — DX: Depression, unspecified: F32.A

## 2011-03-28 HISTORY — DX: Cerebral infarction, unspecified: I63.9

## 2011-03-28 HISTORY — DX: Shortness of breath: R06.02

## 2011-03-28 HISTORY — DX: Anemia, unspecified: D64.9

## 2011-03-28 HISTORY — DX: Pneumonia, unspecified organism: J18.9

## 2011-03-28 HISTORY — DX: Cardiac arrhythmia, unspecified: I49.9

## 2011-03-28 HISTORY — DX: Gastro-esophageal reflux disease without esophagitis: K21.9

## 2011-03-28 LAB — CARDIAC PANEL(CRET KIN+CKTOT+MB+TROPI)
Relative Index: INVALID (ref 0.0–2.5)
Total CK: 28 U/L (ref 7–177)
Troponin I: <0.3 ng/mL (ref <0.30)

## 2011-03-28 LAB — DIFFERENTIAL
Basophils Absolute: 0 10*3/uL (ref 0.0–0.1)
Basophils Relative: 0 % (ref 0–1)
Eosinophils Absolute: 0 10*3/uL (ref 0.0–0.7)
Eosinophils Relative: 0 % (ref 0–5)
Lymphocytes Relative: 9 % — ABNORMAL LOW (ref 12–46)

## 2011-03-28 LAB — FOLATE: Folate: >20 ng/mL

## 2011-03-28 LAB — URINALYSIS, ROUTINE W REFLEX MICROSCOPIC
Leukocytes, UA: NEGATIVE
Protein, ur: NEGATIVE mg/dL
Urobilinogen, UA: 0.2 mg/dL (ref 0.0–1.0)

## 2011-03-28 LAB — BASIC METABOLIC PANEL
CO2: 29 mEq/L (ref 19–32)
Calcium: 10 mg/dL (ref 8.4–10.5)
GFR calc Af Amer: 47 mL/min — ABNORMAL LOW (ref >90)
GFR calc non Af Amer: 40 mL/min — ABNORMAL LOW (ref >90)
Sodium: 140 mEq/L (ref 135–145)

## 2011-03-28 LAB — CBC
MCV: 90 fL (ref 78.0–100.0)
Platelets: 296 10*3/uL (ref 150–400)
RDW: 14.4 % (ref 11.5–15.5)
WBC: 15.8 10*3/uL — ABNORMAL HIGH (ref 4.0–10.5)

## 2011-03-28 LAB — MAGNESIUM: Magnesium: 2 mg/dL (ref 1.5–2.5)

## 2011-03-28 MED ORDER — LORAZEPAM 0.5 MG PO TABS: 0.5000 mg | ORAL_TABLET | Freq: Four times a day (QID) | ORAL | Status: DC | PRN

## 2011-03-28 MED ORDER — CALCIUM CARBONATE-VITAMIN D 500-200 MG-UNIT PO TABS
1.0000 | ORAL_TABLET | Freq: Every day | ORAL | Status: DC
  Administered 2011-03-28 – 2011-04-11 (×16): 1 via ORAL
  Filled 2011-03-28 (×16): qty 1

## 2011-03-28 MED ORDER — ENOXAPARIN SODIUM 60 MG/0.6ML ~~LOC~~ SOLN
1.0000 mg/kg | SUBCUTANEOUS | Status: DC
  Administered 2011-03-28 – 2011-03-30 (×3): 55 mg via SUBCUTANEOUS
  Filled 2011-03-28 (×3): qty 0.6

## 2011-03-28 MED ORDER — DILTIAZEM HCL ER COATED BEADS 180 MG PO CP24
180.0000 mg | ORAL_CAPSULE | Freq: Every day | ORAL | Status: DC
  Administered 2011-03-28 – 2011-04-11 (×15): 180 mg via ORAL
  Filled 2011-03-28 (×15): qty 1

## 2011-03-28 MED ORDER — TRAMADOL HCL 50 MG PO TABS
50.0000 mg | ORAL_TABLET | Freq: Four times a day (QID) | ORAL | Status: DC | PRN
  Administered 2011-03-28 – 2011-04-11 (×25): 50 mg via ORAL
  Filled 2011-03-28 (×26): qty 1

## 2011-03-28 MED ORDER — METOPROLOL TARTRATE 50 MG PO TABS
50.0000 mg | ORAL_TABLET | Freq: Two times a day (BID) | ORAL | Status: DC
  Administered 2011-03-28 – 2011-04-01 (×8): 50 mg via ORAL
  Filled 2011-03-28 (×7): qty 1

## 2011-03-28 MED ORDER — DILTIAZEM HCL ER COATED BEADS 180 MG PO CP24
180.0000 mg | ORAL_CAPSULE | ORAL | Status: AC
  Administered 2011-03-28: 180 mg via ORAL
  Filled 2011-03-28: qty 1

## 2011-03-28 MED ORDER — SODIUM CHLORIDE 0.9 % IV SOLN
Freq: Once | INTRAVENOUS | Status: AC
  Administered 2011-03-28: 15:00:00 via INTRAVENOUS

## 2011-03-28 MED ORDER — MORPHINE SULFATE 2 MG/ML IJ SOLN
2.0000 mg | Freq: Once | INTRAMUSCULAR | Status: AC
  Administered 2011-03-28: 2 mg via INTRAVENOUS
  Filled 2011-03-28: qty 1

## 2011-03-28 MED ORDER — SODIUM CHLORIDE 0.9 % IV SOLN
INTRAVENOUS | Status: DC
  Administered 2011-03-29 – 2011-04-10 (×14): via INTRAVENOUS

## 2011-03-28 MED ORDER — DILTIAZEM HCL ER COATED BEADS 180 MG PO CP24: 180.0000 mg | ORAL_CAPSULE | Freq: Every day | ORAL | Status: DC

## 2011-03-28 MED ORDER — PNEUMOCOCCAL VAC POLYVALENT 25 MCG/0.5ML IJ INJ
0.5000 mL | INJECTION | Freq: Once | INTRAMUSCULAR | Status: AC
  Administered 2011-03-29: 0.5 mL via INTRAMUSCULAR
  Filled 2011-03-28: qty 0.5

## 2011-03-28 MED ORDER — METOPROLOL TARTRATE 25 MG PO TABS: 25.0000 mg | ORAL_TABLET | Freq: Two times a day (BID) | ORAL | Status: DC

## 2011-03-28 MED ORDER — SODIUM CHLORIDE 0.9 % IV BOLUS (SEPSIS)
500.0000 mL | Freq: Once | INTRAVENOUS | Status: AC
  Administered 2011-03-28: 500 mL via INTRAVENOUS

## 2011-03-28 MED ORDER — ONDANSETRON HCL 4 MG/2ML IJ SOLN
4.0000 mg | Freq: Once | INTRAMUSCULAR | Status: AC
  Administered 2011-03-28: 4 mg via INTRAVENOUS
  Filled 2011-03-28: qty 2

## 2011-03-28 MED ORDER — DILTIAZEM HCL 25 MG/5ML IV SOLN
20.0000 mg | Freq: Once | INTRAVENOUS | Status: AC
  Administered 2011-03-28: 20 mg via INTRAVENOUS
  Filled 2011-03-28: qty 5

## 2011-03-28 MED ORDER — BENAZEPRIL HCL 10 MG PO TABS
20.0000 mg | ORAL_TABLET | Freq: Two times a day (BID) | ORAL | Status: DC
  Administered 2011-03-28: 20 mg via ORAL
  Filled 2011-03-28: qty 2

## 2011-03-28 MED ORDER — ROSUVASTATIN CALCIUM 20 MG PO TABS
10.0000 mg | ORAL_TABLET | Freq: Every day | ORAL | Status: DC
  Administered 2011-03-28: 18:00:00 via ORAL
  Administered 2011-03-29 – 2011-03-30 (×2): 10 mg via ORAL
  Administered 2011-03-31: 18:00:00 via ORAL
  Administered 2011-04-01 – 2011-04-10 (×10): 10 mg via ORAL
  Filled 2011-03-28 (×11): qty 1
  Filled 2011-03-28: qty 2
  Filled 2011-03-28: qty 1

## 2011-03-28 MED ORDER — OXYCODONE-ACETAMINOPHEN 5-325 MG PO TABS
2.0000 | ORAL_TABLET | Freq: Once | ORAL | Status: AC
  Administered 2011-03-28: 2 via ORAL
  Filled 2011-03-28: qty 2

## 2011-03-28 MED ORDER — LORAZEPAM 1 MG PO TABS
1.0000 mg | ORAL_TABLET | Freq: Every day | ORAL | Status: DC
  Administered 2011-03-28 – 2011-03-29 (×2): 1 mg via ORAL
  Filled 2011-03-28 (×2): qty 1

## 2011-03-28 MED ORDER — HYDROCODONE-ACETAMINOPHEN 5-325 MG PO TABS
1.0000 | ORAL_TABLET | Freq: Three times a day (TID) | ORAL | Status: DC | PRN
  Administered 2011-03-28 – 2011-04-03 (×15): 1 via ORAL
  Filled 2011-03-28 (×17): qty 1

## 2011-03-28 MED ORDER — BENAZEPRIL HCL 10 MG PO TABS: 20.0000 mg | ORAL_TABLET | Freq: Every day | ORAL | Status: DC

## 2011-03-28 MED ORDER — INFLUENZA VIRUS VACC SPLIT PF IM SUSP
0.5000 mL | INTRAMUSCULAR | Status: AC
  Administered 2011-03-29: 0.5 mL via INTRAMUSCULAR
  Filled 2011-03-28: qty 0.5

## 2011-03-28 NOTE — Progress Notes (Signed)
ANTICOAGULATION CONSULT NOTE - Initial Consult  Pharmacy Consult for Lovenox Indication: atrial fibrillation  No Known Allergies  Patient Measurements: Height: 5' (152.4 cm) Weight: 125 lb 12.8 oz (57.063 kg) IBW/kg (Calculated) : 45.5   Vital Signs: Temp: 97.8 F (36.6 C) (11/13 1657) Temp src: Oral (11/13 1657) BP: 77/44 mmHg (11/13 1657) Pulse Rate: 109  (11/13 1657)  Labs:  Basename 03/28/11 1332  HGB 12.1  HCT 38.0  PLT 296  APTT --  LABPROT --  INR --  HEPARINUNFRC --  CREATININE 1.20*  CKTOTAL --  CKMB --  TROPONINI --   Estimated Creatinine Clearance: 27.6 ml/min (by C-G formula based on Cr of 1.2).  Medical History: Past Medical History  Diagnosis Date  . Hypertension   . Arthritis   . Shortness of breath   . Stroke   . GERD (gastroesophageal reflux disease)   . Dysrhythmia   . Pneumonia   . Depression   . Anemia     Medications:  Scheduled:    . sodium chloride   Intravenous Once  . benazepril  20 mg Oral BID  . calcium-vitamin D  1 tablet Oral Daily  . diltiazem  180 mg Oral NOW  . diltiazem  180 mg Oral Daily  . diltiazem  20 mg Intravenous Once  . enoxaparin (LOVENOX) injection  1 mg/kg Subcutaneous Q24H  . LORazepam  1 mg Oral QHS  . metoprolol tartrate  50 mg Oral BID  . morphine  2 mg Intravenous Once  . ondansetron  4 mg Intravenous Once  . oxyCODONE-acetaminophen  2 tablet Oral Once  . rosuvastatin  10 mg Oral q1800  . sodium chloride  500 mL Intravenous Once  . DISCONTD: benazepril  20 mg Oral Daily  . DISCONTD: diltiazem  180 mg Oral Daily  . DISCONTD: diltiazem  180 mg Oral Daily  . DISCONTD: metoprolol tartrate  25 mg Oral BID    Assessment: Ok for protocol Renal function CrCl less than 30 ml/min  Goal of Therapy:  Full anticoagulation   Plan:  Lovenox 1 mg/kg SQ every 24 hours Monitor renal function  Monitor CBC   Quante Sheri Matthews 03/28/2011,5:20 PM

## 2011-03-28 NOTE — H&P (Signed)
[id number] 

## 2011-03-28 NOTE — ED Notes (Signed)
Patient brought in via EMS. Alert and oriented. Patient brought in for syncopal episode and fall. Per EMS patient brushing hair in bathroom when she had syncopal episode. Patient on spinal board with c-collar. Patient c/o back pain. Patient had irregular pulse per EMS. Placed on monitor. Patient's rhythm a-fib. Per patient no prior hx of Afib.

## 2011-03-28 NOTE — H&P (Signed)
NAME:  Sheri Matthews, Sheri Matthews NO.:  [id number]  MEDICAL RECORD NO.:  [id number]  LOCATION:  A301                          FACILITY:  [facility]  PHYSICIAN:  Melvyn Novas, MDDATE OF BIRTH:  Feb 28, 1924  DATE OF ADMISSION:  03/28/2011 DATE OF DISCHARGE:  LH                             HISTORY & PHYSICAL   The patient is an 77 year old white female who lives with her daughter, who has history of osteoporosis, hypertension, chronic vertigo, hypertension, hyperlipidemia, osteoporosis, degenerative joint disease, who also has been taking too many of her Percocet which were prescribed as 5 mg t.i.d. p.r.n.  The daughter states she has taken 16 in the last 3-4 days, and doubling up on her Ativan which she uses for sleep. Apparently, the patient was in bathroom, combing her hair and had a fall, and she had no antecedent palpitations, dizziness, or syncope. There was no seizure activity reported.  The daughter ran in right away. She had no antecedent angina.  The patient recalls the event and was brought to the hospital ER where upon it was found that she was in AFib with rapid ventricular response which is presumably new to this patient. She was treated with IV Cardizem and oral Cardizem for ventricular rate control and currently, her ventricular response is about 100.  She denies any angina, angina equivalents, nausea, vomiting, melena, hematemesis, or hematochezia.  PAST MEDICAL HISTORY:  Significant for the aforementioned hypertension, hyperlipidemia, anxiety, depression, vertigo, osteoporosis, degenerative joint disease.  PAST SURGICAL HISTORY:  Remarkable for appendectomy.  ALLERGIES:  She has no known allergies.  CURRENT MEDICINES: 1. Lopressor 50 p.o. b.i.d. 2. Benazepril 20 mg p.o. b.i.d. 3. Caltrate 600/400 p.o. daily. 4. Aricept 10 mg p.o. at bedtime. 5. Zocor 40 mg p.o. daily. 6. Norvasc 5 mg p.o. daily. 7. Ativan 1 mg p.o. at bedtime p.r.n. 8.  Meclizine 1 p.o. t.i.d. p.r.n. 9. Percocet 5/325 p.o. t.i.d. p.r.n. for pain. 10.Aspirin 81 mg p.o. daily.  PHYSICAL EXAMINATION:  VITAL SIGNS:  Blood pressure at present is 95/65, temperature 97.9, pulse is 95 and irregularly irregular, respiratory rate is 16, O2 sat is 90% on room air. HEENT:  Eyes, PERRLA.  Extraocular movements intact.  Sclerae clear. Conjunctivae pink.  Head shows normocephalic, atraumatic. NECK:  No carotid bruits.  No thyromegaly.  No thyroid bruits. LUNGS:  Clear to A and P.  No diminished breath sounds at bases.  No rales, wheeze, or rhonchi appreciable. HEART:  Irregularly irregular.  No S3 gallop.  No heaves, thrills, rubs were appreciable. ABDOMEN:  Soft, nontender.  Bowel sounds normoactive.  No guarding, no rebound, no hepatosplenomegaly. EXTREMITIES:  Trace to 1+ pedal edema.  Peripheral pulses 1+ and intact bilaterally. NEUROLOGIC:  Cranial nerves II-XII grossly intact.  The patient moves all 4 extremities.  Plantars are downgoing.  IMPRESSION: 1. Falls today. 2. Over utilization of opioids and benzodiazepines. 3. New-onset atrial fibrillation, presumably with rapid ventricle     response, currently rate controlled. 4. Degenerative joint disease. 5. Osteoporosis. 6. Hypertension. 7. Hyperlipidemia.  PLAN:  To admit, get 2D echocardiogram, thyroid function test, Lovenox for anticoagulation, and consider Coumadin therapy.  We will assess risk- benefit ratio  in light of her how many falls she has had in the recent weeks.  Continue diltiazem 180 p.o. daily and Lopressor 50 p.o. b.i.d., as well as Lotensin 20 mg p.o. daily.     Melvyn Novas, MD     RMD/MEDQ  D:  03/28/2011  T:  03/28/2011  Job:  [id number]

## 2011-03-28 NOTE — ED Notes (Signed)
Patient requesting something for pain. Dr Colon Branch made aware. Awaiting orders.

## 2011-03-28 NOTE — ED Notes (Signed)
Report given to Clydie Braun, RN on unit 300. Ready to receive patient.

## 2011-03-28 NOTE — ED Provider Notes (Signed)
History  Scribed for [employer]. Colon Branch, MD, the patient was seen in [facility]. The chart was scribed by Gilman Schmidt. The patients care was started at 11:15am.   CSN: [id number] Arrival date & time: 03/28/2011 10:56 AM   First MD Initiated Contact with Patient 03/28/11 1111      Chief Complaint  Patient presents with  . Loss of Consciousness  . Fall    HPI Sheri Matthews is a 77 y.o. female who presents to the Emergency Department complaining of syncopal episode and fall. Patient brought in via EMS. Alert and oriented. Per EMS patient brushing hair in bathroom when she had syncopal episode. Patient on spinal board with c-collar. Patient c/o back pain. Patient had irregular pulse per EMS. Placed on monitor. Patient's rhythm a-fib. Per patient no prior hx of Afib. Pt has had prior syncopal episodes. Per daughter, pt has also had intermittent cough.   PCP: Dr. Delbert Harness   Past Medical History  Diagnosis Date  . Hypertension   . Arthritis     Past Surgical History  Procedure Date  . Back surgery   . Ovaries removed    No family history on file.  History  Substance Use Topics  . Smoking status: Never Smoker   . Smokeless tobacco: Not on file  . Alcohol Use: No    OB History    Grav Para Term Preterm Abortions TAB SAB Ect Mult Living                  Review of Systems  HENT: Positive for neck pain.   Musculoskeletal: Positive for back pain.  Skin: Negative for wound.  Neurological: Positive for syncope and headaches.  All other systems reviewed and are negative.    Allergies  Review of patient's allergies indicates no known allergies.  Home Medications   Current Outpatient Rx  Name Route Sig Dispense Refill  . AMLODIPINE BESYLATE 5 MG PO TABS Oral Take 5 mg by mouth daily.      . ASPIRIN EC 81 MG PO TBEC Oral Take 81 mg by mouth daily.      Marland Kitchen BENAZEPRIL HCL 20 MG PO TABS Oral Take 20 mg by mouth 2 (two) times daily.      . DONEPEZIL HCL 5 MG PO TABS Oral  Take 5 mg by mouth at bedtime.     . FLUOXETINE HCL 20 MG PO CAPS Oral Take 20 mg by mouth daily.      Marland Kitchen LORAZEPAM 1 MG PO TABS Oral Take 1 mg by mouth at bedtime.     Marland Kitchen METOPROLOL TARTRATE 50 MG PO TABS Oral Take 50 mg by mouth 2 (two) times daily.      . OXYCODONE-ACETAMINOPHEN 5-325 MG PO TABS Oral Take 1 tablet by mouth every 4 (four) hours as needed. Pain      . PANTOPRAZOLE SODIUM 40 MG PO TBEC Oral Take 40 mg by mouth daily.      Marland Kitchen SIMVASTATIN 40 MG PO TABS Oral Take 40 mg by mouth at bedtime.      Marland Kitchen NAPROXEN 500 MG PO TABS Oral Take 1 tablet (500 mg total) by mouth 2 (two) times daily. 30 tablet 0    BP 97/58  Pulse 135  Temp(Src) 97.9 F (36.6 C) (Oral)  Resp 18  SpO2 99%  Physical Exam  Nursing note and vitals reviewed. Constitutional: She is oriented to person, place, and time. She appears well-developed and well-nourished.  Non-toxic appearance. She does not  have a sickly appearance.  HENT:  Head: Normocephalic and atraumatic.  Right Ear: External ear normal.  Left Ear: External ear normal.  Nose: Nose normal.  Mouth/Throat: Oropharynx is clear and moist.  Eyes: Conjunctivae, EOM and lids are normal. Pupils are equal, round, and reactive to light. No scleral icterus.  Neck: Trachea normal and normal range of motion. Neck supple. Carotid bruit is not present.  Cardiovascular: An irregular rhythm present. Exam reveals no gallop and no friction rub.   No murmur heard.      Rapid   Pulmonary/Chest:       Crackles at bases Coarse upper airway breath sounds cleared with cough  Abdominal: Soft. Normal appearance. There is no tenderness. There is no rebound, no guarding and no CVA tenderness.  Musculoskeletal: Normal range of motion. She exhibits no edema.       Full ROM in shoulder, elbow, wrist, hips, knees and ankles  Neurological: She is alert and oriented to person, place, and time. She has normal strength.       Mild right facial droop  Skin: Skin is warm, dry and  intact. No rash noted.    ED Course  Procedures  DIAGNOSTIC STUDIES: Oxygen Saturation is 97% on room air, normal by my interpretation.    COORDINATION OF CARE: 11:15am:  - Patient evaluated by ED physician, Percocet, Cardizem, Zofran, DG Chest, CT Cervical Spine, CT Head, labs ordered 1:31pm: Recheck by EDP. C-collar taken off. 2:16pm: Consult with  PCP Dr. Janna Arch. [place] will do admission orders.    LABS: Results for orders placed during the hospital encounter of 03/28/11  CBC      Component Value Range   WBC 15.8 (*) 4.0 - 10.5 (K/uL)   RBC 4.22  3.87 - 5.11 (MIL/uL)   Hemoglobin 12.1  12.0 - 15.0 (g/dL)   HCT 40.9  81.1 - 91.4 (%)   MCV 90.0  78.0 - 100.0 (fL)   MCH 28.7  26.0 - 34.0 (pg)   MCHC 31.8  30.0 - 36.0 (g/dL)   RDW 78.2  95.6 - 21.3 (%)   Platelets 296  150 - 400 (K/uL)  DIFFERENTIAL      Component Value Range   Neutrophils Relative 82 (*) 43 - 77 (%)   Neutro Abs 12.9 (*) 1.7 - 7.7 (K/uL)   Lymphocytes Relative 9 (*) 12 - 46 (%)   Lymphs Abs 1.5  0.7 - 4.0 (K/uL)   Monocytes Relative 9  3 - 12 (%)   Monocytes Absolute 1.3 (*) 0.1 - 1.0 (K/uL)   Eosinophils Relative 0  0 - 5 (%)   Eosinophils Absolute 0.0  0.0 - 0.7 (K/uL)   Basophils Relative 0  0 - 1 (%)   Basophils Absolute 0.0  0.0 - 0.1 (K/uL)  BASIC METABOLIC PANEL      Component Value Range   Sodium 140  135 - 145 (mEq/L)   Potassium 4.6  3.5 - 5.1 (mEq/L)   Chloride 102  96 - 112 (mEq/L)   CO2 29  19 - 32 (mEq/L)   Glucose, Bld 110 (*) 70 - 99 (mg/dL)   BUN 16  6 - 23 (mg/dL)   Creatinine, Ser 0.86 (*) 0.50 - 1.10 (mg/dL)   Calcium 57.8  8.4 - 10.5 (mg/dL)   GFR calc non Af Amer 40 (*) >90 (mL/min)   GFR calc Af Amer 47 (*) >90 (mL/min)      RADIOLOGY: CT Head. Reviewed by me. IMPRESSION: Stable atrophy and  small vessel disease. No acute intracranial abnormality. Original Report Authenticated By: Juline Patch, M.D.  CT Cervical Spine. Reviewed by me. IMPRESSION: Straightened  alignment with degenerative disc disease. No acute fracture. Original Report Authenticated By: Juline Patch, M.D.  DG Chest 1 View. Reviewed by me. IMPRESSION: No evidence of acute cardiopulmonary disease. Chronic interstitial markings. Original Report Authenticated By: Charline Bills, M.D.   Date: 03/30/2011  1056  Rate: 124  Rhythm: atrial fibrillation  QRS Axis: normal  Intervals: atrial fib  ST/T Wave abnormalities: nonspecific ST/T changes  Conduction Disutrbances:atrial fib  Narrative Interpretation:   Old EKG Reviewed: changes notednew atrial fibrillation since EKG of 09/15/2010    MDM  Patient presents with syncopal episode and fall, in new onset atrial fibrillation with a rapid response. Has had several falls and syncopal episodes over the past few months which may have been due to converting to atrial fibrillation/NSR.  There were no injuries related to the fall associated with the syncopal episode. Cardizem x 1 given with decreased ventricular response. Spoke with Dr. Janna Arch, PCP for admission to the hospital.  CRITICAL CARE Performed by: Annamarie Dawley   Total critical care time: 40 Critical care time was exclusive of separately billable procedures and treating other patients.  Critical care was necessary to treat or prevent imminent or life-threatening deterioration.  Critical care was time spent personally by me on the following activities: development of treatment plan with patient and/or surrogate as well as nursing, discussions with consultants, evaluation of patient's response to treatment, examination of patient, obtaining history from patient or surrogate, ordering and performing treatments and interventions, ordering and review of laboratory studies, ordering and review of radiographic studies, pulse oximetry and re-evaluation of patient's condition.  I personally performed the services described in this documentation, which was scribed in my presence. The  recorded information has been reviewed and considered.      Nicoletta Dress. Colon Branch, MD 03/30/11 1119

## 2011-03-28 NOTE — ED Notes (Signed)
Patient requesting pain medication for shoulders and head. Dr. Colon Branch made aware. Orders received.

## 2011-03-29 DIAGNOSIS — I517 Cardiomegaly: Secondary | ICD-10-CM

## 2011-03-29 LAB — CARDIAC PANEL(CRET KIN+CKTOT+MB+TROPI)
Relative Index: INVALID (ref 0.0–2.5)
Relative Index: INVALID (ref 0.0–2.5)
Total CK: 27 U/L (ref 7–177)
Troponin I: <0.3 ng/mL (ref <0.30)

## 2011-03-29 LAB — CBC
MCHC: 31.1 g/dL (ref 30.0–36.0)
RDW: 14.9 % (ref 11.5–15.5)

## 2011-03-29 LAB — BASIC METABOLIC PANEL
BUN: 19 mg/dL (ref 6–23)
Calcium: 9.1 mg/dL (ref 8.4–10.5)
GFR calc non Af Amer: 28 mL/min — ABNORMAL LOW (ref >90)
Glucose, Bld: 100 mg/dL — ABNORMAL HIGH (ref 70–99)
Sodium: 139 mEq/L (ref 135–145)

## 2011-03-29 LAB — HEPATIC FUNCTION PANEL
Bilirubin, Direct: 0.1 mg/dL (ref 0.0–0.3)
Total Bilirubin: 0.4 mg/dL (ref 0.3–1.2)

## 2011-03-29 LAB — RPR: RPR Ser Ql: NONREACTIVE

## 2011-03-29 LAB — T4, FREE: Free T4: 1.56 ng/dL (ref 0.80–1.80)

## 2011-03-29 LAB — TSH: TSH: 0.472 u[IU]/mL (ref 0.350–4.500)

## 2011-03-29 MED ORDER — BENAZEPRIL HCL 10 MG PO TABS
5.0000 mg | ORAL_TABLET | Freq: Every day | ORAL | Status: DC
  Administered 2011-03-29 – 2011-03-30 (×2): 5 mg via ORAL
  Administered 2011-03-31: 10:00:00 via ORAL
  Administered 2011-04-01 – 2011-04-02 (×2): 5 mg via ORAL
  Administered 2011-04-03: 10:00:00 via ORAL
  Administered 2011-04-04 – 2011-04-11 (×8): 5 mg via ORAL
  Filled 2011-03-29 (×2): qty 1
  Filled 2011-03-29: qty 2
  Filled 2011-03-29 (×5): qty 1
  Filled 2011-03-29: qty 2
  Filled 2011-03-29 (×2): qty 1
  Filled 2011-03-29: qty 2
  Filled 2011-03-29: qty 1
  Filled 2011-03-29: qty 2

## 2011-03-29 MED ORDER — GUAIFENESIN 100 MG/5ML PO SOLN
5.0000 mL | ORAL | Status: DC | PRN
  Administered 2011-03-29 – 2011-04-03 (×10): 100 mg via ORAL
  Filled 2011-03-29 (×10): qty 5

## 2011-03-29 NOTE — Progress Notes (Signed)
Sheri Matthews, Sheri Matthews                ACCOUNT NO.:  [id number]  MEDICAL RECORD NO.:  [id number]  LOCATION:  A301                          FACILITY:  [facility]  PHYSICIAN:  Melvyn Novas, MDDATE OF BIRTH:  [birth year]  DATE OF PROCEDURE: DATE OF DISCHARGE:                                PROGRESS NOTE   Blood pressure was low this morning 90/48, pulse is 50 and regular, respiratory rate is 18, O2 sat is 91%.  Hemoglobin 12.1.  Chest x-ray shows no evidence of cardiopulmonary disease.  His creatinine climbed from 1.2 to 1.63 since yesterday.  She currently is AFib with ventricular response well controlled and blood pressure is somewhat low.  The plan currently is to obtain 2D echocardiogram today, awaiting thyroid function profile.  We will decrease benazepril from 20 b.i.d. to 5 mg p.o. daily.  Monitor renal function.  We will also decrease Lopressor 50 b.i.d. to 25 b.i.d. and optimize hemodynamics.  Cardiac enzymes were negative.     Melvyn Novas, MD     RMD/MEDQ  D:  03/29/2011  T:  03/29/2011  Job:  [id number]

## 2011-03-29 NOTE — Progress Notes (Signed)
Utilization review completed.  

## 2011-03-29 NOTE — Progress Notes (Signed)
[id number] 

## 2011-03-29 NOTE — Progress Notes (Signed)
ANTICOAGULATION CONSULT NOTE - Initial Consult  Pharmacy Consult for Lovenox Indication: atrial fibrillation  No Known Allergies  Patient Measurements: Height: 5' (152.4 cm) Weight: 125 lb 12.8 oz (57.063 kg) IBW/kg (Calculated) : 45.5   Vital Signs: Temp: 98.3 F (36.8 C) (11/14 0500) Temp src: Oral (11/14 0500) BP: 90/48 mmHg (11/14 0500) Pulse Rate: 48  (11/14 0500)  Labs:  Basename 03/29/11 0840 03/29/11 0438 03/28/11 2052 03/28/11 1332  HGB 10.4* -- -- 12.1  HCT 33.4* -- -- 38.0  PLT 280 -- -- 296  APTT -- -- -- --  LABPROT -- 14.8 -- --  INR -- 1.14 -- --  HEPARINUNFRC -- -- -- --  CREATININE -- 1.63* -- 1.20*  CKTOTAL -- 26 28 --  CKMB -- 2.6 2.8 --  TROPONINI -- <0.30 <0.30 --   Estimated Creatinine Clearance: 20.3 ml/min (by C-G formula based on Cr of 1.63).  Medical History: Past Medical History  Diagnosis Date  . Hypertension   . Arthritis   . Shortness of breath   . Stroke   . GERD (gastroesophageal reflux disease)   . Dysrhythmia   . Pneumonia   . Depression   . Anemia     Medications:  Scheduled:     . sodium chloride   Intravenous Once  . benazepril  5 mg Oral Daily  . calcium-vitamin D  1 tablet Oral Daily  . diltiazem  180 mg Oral NOW  . diltiazem  180 mg Oral Daily  . diltiazem  20 mg Intravenous Once  . enoxaparin (LOVENOX) injection  1 mg/kg Subcutaneous Q24H  . influenza  inactive virus vaccine  0.5 mL Intramuscular Tomorrow-1000  . LORazepam  1 mg Oral QHS  . metoprolol tartrate  50 mg Oral BID  . morphine  2 mg Intravenous Once  . ondansetron  4 mg Intravenous Once  . oxyCODONE-acetaminophen  2 tablet Oral Once  . pneumococcal 23 valent vaccine  0.5 mL Intramuscular Once  . rosuvastatin  10 mg Oral q1800  . sodium chloride  500 mL Intravenous Once  . DISCONTD: benazepril  20 mg Oral Daily  . DISCONTD: benazepril  20 mg Oral BID  . DISCONTD: diltiazem  180 mg Oral Daily  . DISCONTD: diltiazem  180 mg Oral Daily  .  DISCONTD: metoprolol tartrate  25 mg Oral BID    Assessment:  Follow up platelet count within normal limits.  Goal of Therapy:  Full anticoagulation   Plan:  Lovenox 1 mg/kg SQ every 24 hours Monitor renal function  Monitor CBC   Izeah Vossler J 03/29/2011,9:45 AM

## 2011-03-29 NOTE — Progress Notes (Signed)
*  PRELIMINARY RESULTS* Echocardiogram 2D Echocardiogram has been performed.  Sheri Matthews 03/29/2011, 2:34 PM

## 2011-03-30 LAB — CBC
Platelets: 232 10*3/uL (ref 150–400)
RBC: 3.38 MIL/uL — ABNORMAL LOW (ref 3.87–5.11)
WBC: 13.1 10*3/uL — ABNORMAL HIGH (ref 4.0–10.5)

## 2011-03-30 LAB — HEPATIC FUNCTION PANEL
ALT: 12 U/L (ref 0–35)
Albumin: 3 g/dL — ABNORMAL LOW (ref 3.5–5.2)
Alkaline Phosphatase: 92 U/L (ref 39–117)
Total Protein: 6 g/dL (ref 6.0–8.3)

## 2011-03-30 LAB — BASIC METABOLIC PANEL
CO2: 23 mEq/L (ref 19–32)
Chloride: 109 mEq/L (ref 96–112)
Sodium: 140 mEq/L (ref 135–145)

## 2011-03-30 LAB — PROTIME-INR: Prothrombin Time: 14.4 seconds (ref 11.6–15.2)

## 2011-03-30 MED ORDER — LORAZEPAM 0.5 MG PO TABS
0.5000 mg | ORAL_TABLET | Freq: Four times a day (QID) | ORAL | Status: DC | PRN
  Administered 2011-04-02 – 2011-04-06 (×3): 0.5 mg via ORAL
  Filled 2011-03-30 (×8): qty 1

## 2011-03-30 MED ORDER — LORAZEPAM 0.5 MG PO TABS
0.5000 mg | ORAL_TABLET | Freq: Two times a day (BID) | ORAL | Status: DC
  Administered 2011-03-30 – 2011-04-10 (×24): 0.5 mg via ORAL
  Filled 2011-03-30 (×17): qty 1

## 2011-03-30 MED ORDER — TUBERCULIN PPD 5 UNIT/0.1ML ID SOLN
5.0000 [IU] | Freq: Once | INTRADERMAL | Status: AC
  Administered 2011-03-30: 5 [IU] via INTRADERMAL
  Filled 2011-03-30: qty 0.1

## 2011-03-30 NOTE — Progress Notes (Signed)
[id number] 

## 2011-03-30 NOTE — Progress Notes (Signed)
CSW notified RN of need for TB skin test for pt to go to ALF.  RN to request order from MD.  [name]

## 2011-03-30 NOTE — Progress Notes (Signed)
Spoke to pts daughter glenda concerning d/c needs.Per glenda pt is increasing confused and has been staying with her other daughter. Family is now requesint placement referral to [facility].

## 2011-03-30 NOTE — Progress Notes (Signed)
NAME:  Sheri Matthews, Sheri Matthews NO.:  [id number]  MEDICAL RECORD NO.:  [id number]  LOCATION:  A301                          FACILITY:  [facility]  PHYSICIAN:  Melvyn Novas, MDDATE OF BIRTH:  1924/03/01  DATE OF PROCEDURE: DATE OF DISCHARGE:                                PROGRESS NOTE   The patient is little agitated, little disoriented this morning, refused blood work.  I went ahead and explained the need for blood work who now agrees to have it done.  It could be a little episode of withdrawal, she mentions her pain medicines and I switched her from Percocet to tramadol and from over utilizing Ativan to 1 mg p.o. at bedtime.  We will now reinstitute Ativan 0.5 q.6 h. p.r.n. for calming effect.  She continues to be in atrial fibrillation.  We did not see results of 2D echo.  Her renal function was within normal limits.  Cardiac enzymes are negative. Hemoglobin 10.4.  A 12-lead reveals atrial fibrillation with ventricular response of 50-70, much better rate control.  We will start Coumadin 5 mg p.o. today as per pharmacy protocol and see if she is able to tolerate this and is compliant.  Compliance has been an issue.  Neck shows no JVD.  Lungs are clear to A and P. No rales, wheezes, or rhonchi.  The patient is sitting in chair.  Heart irregularly irregular. No S3 gallop.     Melvyn Novas, MD     RMD/MEDQ  D:  03/30/2011  T:  03/30/2011  Job:  [id number]

## 2011-03-30 NOTE — Progress Notes (Signed)
Gave TB skin test on 03/30/11 in right forearm. Circled with pen. Tolerated well.

## 2011-03-30 NOTE — Clinical Documentation Improvement (Signed)
CHANGE MENTAL STATUS DOCUMENTATION CLARIFICATION   Dear Dr. Janna Arch Marton [place]  In an effort to better capture your patient's severity of illness, reflect appropriate length of stay and utilization of resources, a review of the patient medical record has revealed the following indicators.    Based on your clinical judgment, please clarify and document in a progress note and/or discharge summary the clinical condition associated with the following supporting information:  In responding to this query please exercise your independent judgment.  The fact that a query is asked, does not imply that any particular answer is desired or expected.  Possible Clinical Conditions?  _______Encephalopathy (describe type if known)                       Metabolic                       Toxic _______Drug induced confusion/delirium _______Acute confusion _______Acute delirium _______Acute exacerbation of known dementia (indicate type) _______New diagnosis of Dementia, Alzheimer's, cerebral atherosclerosis _______Poisoning / Overdose _______Other Condition__________________ _______Cannot Clinically Determine   Clinical Information:  Risk Factors: taking too many of her Percocet which were prescribed as 5 mg t.i.d. p.r.n. The daughter states she has taken 16 in the last 3-4 days, and doubling up on her Ativan which she uses for sleep. Possible withdrawal symptoms Noncompliance with medications  Signs & Symptoms: Agitated Confused Disoriented  Treatment: Changed Percocet to Tramadol Reinstituted Ativan 0.5mg  every 6 hours prn for calming effect.    Reviewed: No response from MD   Thank You,  Sincerely, Harless Litten RN, MSN Clinical Documentation Specialist: Office# [phone]  Health Information Management [place]  TO RESPOND TO THE THIS QUERY, FOLLOW THE INSTRUCTIONS BELOW:  1. If needed, update documentation for the patient's encounter via the notes activity.   2. Access this query again and click edit on the [employer].  3. After updating, or not, click F2 to complete all highlighted (required) fields concerning your review. Select "additional documentation in the medical record" OR "no additional documentation provided". 4. Click Sign note button.  5. The deficiency will fall out of your [facility]

## 2011-03-31 LAB — BASIC METABOLIC PANEL
CO2: 23 mEq/L (ref 19–32)
Calcium: 9.1 mg/dL (ref 8.4–10.5)
Chloride: 108 mEq/L (ref 96–112)
GFR calc Af Amer: 60 mL/min — ABNORMAL LOW (ref >90)
Sodium: 140 mEq/L (ref 135–145)

## 2011-03-31 LAB — HEPATIC FUNCTION PANEL
Albumin: 2.7 g/dL — ABNORMAL LOW (ref 3.5–5.2)
Alkaline Phosphatase: 88 U/L (ref 39–117)
Total Protein: 5.7 g/dL — ABNORMAL LOW (ref 6.0–8.3)

## 2011-03-31 LAB — CBC
HCT: 28.5 % — ABNORMAL LOW (ref 36.0–46.0)
Hemoglobin: 9 g/dL — ABNORMAL LOW (ref 12.0–15.0)
MCV: 90.5 fL (ref 78.0–100.0)
RDW: 14.6 % (ref 11.5–15.5)
WBC: 11.8 10*3/uL — ABNORMAL HIGH (ref 4.0–10.5)

## 2011-03-31 LAB — PROTIME-INR: INR: 1.22 (ref 0.00–1.49)

## 2011-03-31 MED ORDER — SODIUM CHLORIDE 0.9 % IJ SOLN
INTRAMUSCULAR | Status: AC
  Administered 2011-03-31: 18:00:00
  Filled 2011-03-31: qty 3

## 2011-03-31 MED ORDER — ENOXAPARIN SODIUM 80 MG/0.8ML ~~LOC~~ SOLN
80.0000 mg | SUBCUTANEOUS | Status: DC
  Administered 2011-03-31 – 2011-04-04 (×5): 80 mg via SUBCUTANEOUS
  Filled 2011-03-31 (×6): qty 0.8

## 2011-03-31 NOTE — Progress Notes (Signed)
NAME:  Sheri Matthews, Sheri Matthews NO.:  [id number]  MEDICAL RECORD NO.:  [id number]  LOCATION:  A301                          FACILITY:  [facility]  PHYSICIAN:  Melvyn Novas, MDDATE OF BIRTH:  [birth year]  DATE OF PROCEDURE: DATE OF DISCHARGE:                                PROGRESS NOTE   The patient has chronic AFib, currently on Lovenox transitioning to Coumadin.  The patient has been somewhat noncompliant, refused blood work yesterday.  She also has concomitant mild anemia, hemoglobin down to 9.0 with WBC 11.8.  Electrolytes and renal function within normal limits.  Creatinine was 1.63.  The patient was stressed the need to take blood thinners to prevent embolism.  She seems to understand.  We will do dementia panel today.  Neck shows no JVD.  Lungs show clear to A and P.  No rales, wheezes, or rhonchi appreciable.  Heart is irregularly regular.  No S3.  Rate is controlled.  Plan right now is to get dementia panel, BMET, and CBC.  Stools for occult blood x3.  Continue Coumadin as per pharmacy.     Melvyn Novas, MD     RMD/MEDQ  D:  03/31/2011  T:  03/31/2011  Job:  [id number]

## 2011-03-31 NOTE — Progress Notes (Signed)
[id number] 

## 2011-03-31 NOTE — Progress Notes (Signed)
ANTICOAGULATION CONSULT NOTE - follow up  Pharmacy Consult for Lovenox Indication: atrial fibrillation  No Known Allergies  Patient Measurements: Height: 5' (152.4 cm) Weight: 125 lb 12.8 oz (57.063 kg) IBW/kg (Calculated) : 45.5   Vital Signs: Temp: 99.8 F (37.7 C) (11/16 0630) Temp src: Oral (11/16 0630) BP: 134/73 mmHg (11/16 0630) Pulse Rate: 110  (11/16 0630)  Labs:  Basename 03/31/11 0441 03/30/11 0711 03/29/11 1249 03/29/11 0840 03/29/11 0438 03/28/11 2052  HGB 9.0* 9.6* -- -- -- --  HCT 28.5* 30.8* -- 33.4* -- --  PLT 220 232 -- 280 -- --  APTT -- -- -- -- -- --  LABPROT 15.7* 14.4 -- -- 14.8 --  INR 1.22 1.10 -- -- 1.14 --  HEPARINUNFRC -- -- -- -- -- --  CREATININE 0.97 1.21* -- -- 1.63* --  CKTOTAL -- -- 27 -- 26 28  CKMB -- -- 2.2 -- 2.6 2.8  TROPONINI -- -- <0.30 -- <0.30 <0.30   Estimated Creatinine Clearance: 33.5 ml/min (by C-G formula based on Cr of 0.97).  Medical History: Past Medical History  Diagnosis Date  . Hypertension   . Arthritis   . Shortness of breath   . Stroke   . GERD (gastroesophageal reflux disease)   . Dysrhythmia   . Pneumonia   . Depression   . Anemia    Medications:  Scheduled:     . benazepril  5 mg Oral Daily  . calcium-vitamin D  1 tablet Oral Daily  . diltiazem  180 mg Oral Daily  . enoxaparin (LOVENOX) injection  80 mg Subcutaneous Q24H  . LORazepam  0.5 mg Oral BID  . metoprolol tartrate  50 mg Oral BID  . rosuvastatin  10 mg Oral q1800  . tuberculin  5 Units Intradermal Once  . DISCONTD: enoxaparin (LOVENOX) injection  1 mg/kg Subcutaneous Q24H   Assessment: platelet count within normal limits. SCr much improved, renal fxn improved  Goal of Therapy:  Full anticoagulation   Plan:  Lovenox 1.5 mg/kg SQ every 24 hours (80mg ) Monitor renal function  Monitor CBC   Karena Kinker A 03/31/2011,8:21 AM

## 2011-04-01 ENCOUNTER — Inpatient Hospital Stay (HOSPITAL_COMMUNITY): Payer: Medicare Other

## 2011-04-01 LAB — BASIC METABOLIC PANEL
BUN: 7 mg/dL (ref 6–23)
Chloride: 107 mEq/L (ref 96–112)
GFR calc Af Amer: 65 mL/min — ABNORMAL LOW (ref >90)
Glucose, Bld: 101 mg/dL — ABNORMAL HIGH (ref 70–99)
Potassium: 3.4 mEq/L — ABNORMAL LOW (ref 3.5–5.1)

## 2011-04-01 LAB — CBC
HCT: 32 % — ABNORMAL LOW (ref 36.0–46.0)
Hemoglobin: 10.4 g/dL — ABNORMAL LOW (ref 12.0–15.0)
RBC: 3.55 MIL/uL — ABNORMAL LOW (ref 3.87–5.11)
WBC: 19.7 10*3/uL — ABNORMAL HIGH (ref 4.0–10.5)

## 2011-04-01 LAB — PROTIME-INR: INR: 1.21 (ref 0.00–1.49)

## 2011-04-01 MED ORDER — AZITHROMYCIN 250 MG PO TABS
500.0000 mg | ORAL_TABLET | Freq: Every day | ORAL | Status: AC
  Administered 2011-04-01: 500 mg via ORAL
  Filled 2011-04-01: qty 1

## 2011-04-01 MED ORDER — POTASSIUM CHLORIDE CRYS ER 20 MEQ PO TBCR
20.0000 meq | EXTENDED_RELEASE_TABLET | Freq: Every day | ORAL | Status: AC
  Administered 2011-04-01 – 2011-04-02 (×2): 20 meq via ORAL
  Filled 2011-04-01 (×2): qty 1

## 2011-04-01 MED ORDER — AZITHROMYCIN 250 MG PO TABS
250.0000 mg | ORAL_TABLET | Freq: Every day | ORAL | Status: AC
  Administered 2011-04-02 – 2011-04-05 (×4): 250 mg via ORAL
  Filled 2011-04-01 (×5): qty 1

## 2011-04-01 MED ORDER — METOPROLOL TARTRATE 50 MG PO TABS
75.0000 mg | ORAL_TABLET | Freq: Once | ORAL | Status: AC
  Administered 2011-04-01: 75 mg via ORAL
  Filled 2011-04-01: qty 1

## 2011-04-01 MED ORDER — METOPROLOL TARTRATE 25 MG PO TABS
25.0000 mg | ORAL_TABLET | Freq: Two times a day (BID) | ORAL | Status: DC
  Administered 2011-04-01: 25 mg via ORAL
  Filled 2011-04-01: qty 1

## 2011-04-01 MED ORDER — LEVALBUTEROL HCL 0.63 MG/3ML IN NEBU
0.6300 mg | INHALATION_SOLUTION | Freq: Three times a day (TID) | RESPIRATORY_TRACT | Status: DC
  Administered 2011-04-01 – 2011-04-11 (×20): 0.63 mg via RESPIRATORY_TRACT
  Filled 2011-04-01 (×20): qty 3

## 2011-04-01 NOTE — Progress Notes (Signed)
[id number] 

## 2011-04-01 NOTE — Progress Notes (Signed)
Sheri Matthews, Sheri Matthews NO.:  [id number]  MEDICAL RECORD NO.:  [id number]  LOCATION:  A301                          FACILITY:  [facility]  PHYSICIAN:  Melvyn Novas, MDDATE OF BIRTH:  [birth year]  DATE OF PROCEDURE: DATE OF DISCHARGE:                                PROGRESS NOTE   PROBLEM LIST: 1. Recent onset atrial fibrillation. 2. Moderate ventricular response. 3. Some dizziness secondary to this, which is now resolved. 4. Some mild anemia, hemoglobin 10.4. 5. New onset bronchial congestion, perhaps atelectasis from lying in     bed. 6. Hypertension. 7. Hyperlipidemia.  OBJECTIVE:  VITAL SIGNS:  Blood pressure today is 134/77, temperature 99.2, pulse is 90 and regular, respiratory rate is 20, O2 sat is 88% on room air. NECK:  No JVD. LUNGS:  Scattered coarse rhonchi in the left lung field, greater than right.  No rales appreciable. HEART:  Irregularly irregular.  No S3 auscultated.  LABORATORY DATA:  Hemoglobin 10.4.  WBC is 19.7.  Creatinine is 0.91, potassium 3.4.  Our plan at present is to obtain chest x-ray PA and lateral, add potassium chloride 20 mEq p.o. daily.  We will add Lopressor 25 p.o. b.i.d., diltiazem 180 p.o. daily, add Zithromax 500 today and 250 p.o. daily empirically, and encouraged incentive spirometry.  Continue Coumadin as per pharmacy consult and increase ambulation.     Melvyn Novas, MD     RMD/MEDQ  D:  04/01/2011  T:  04/01/2011  Job:  [id number]

## 2011-04-01 NOTE — Progress Notes (Signed)
Patient's TB test is negative

## 2011-04-02 ENCOUNTER — Inpatient Hospital Stay (HOSPITAL_COMMUNITY): Payer: Medicare Other

## 2011-04-02 LAB — BASIC METABOLIC PANEL
BUN: 8 mg/dL (ref 6–23)
GFR calc non Af Amer: 58 mL/min — ABNORMAL LOW (ref >90)
Glucose, Bld: 116 mg/dL — ABNORMAL HIGH (ref 70–99)
Potassium: 3.3 mEq/L — ABNORMAL LOW (ref 3.5–5.1)

## 2011-04-02 LAB — CBC
HCT: 28.4 % — ABNORMAL LOW (ref 36.0–46.0)
Hemoglobin: 9.1 g/dL — ABNORMAL LOW (ref 12.0–15.0)
MCH: 28.9 pg (ref 26.0–34.0)
MCHC: 32 g/dL (ref 30.0–36.0)

## 2011-04-02 LAB — MAGNESIUM: Magnesium: 1.5 mg/dL (ref 1.5–2.5)

## 2011-04-02 MED ORDER — TUBERCULIN PPD 5 UNIT/0.1ML ID SOLN
5.0000 [IU] | Freq: Once | INTRADERMAL | Status: DC
  Filled 2011-04-02: qty 0.1

## 2011-04-02 MED ORDER — WARFARIN SODIUM 5 MG PO TABS
5.0000 mg | ORAL_TABLET | Freq: Once | ORAL | Status: AC
  Administered 2011-04-02: 5 mg via ORAL
  Filled 2011-04-02: qty 1

## 2011-04-02 MED ORDER — METOPROLOL TARTRATE 50 MG PO TABS
100.0000 mg | ORAL_TABLET | Freq: Two times a day (BID) | ORAL | Status: DC
  Administered 2011-04-02 – 2011-04-11 (×19): 100 mg via ORAL
  Filled 2011-04-02 (×18): qty 2

## 2011-04-02 MED ORDER — HYDROCOD POLST-CHLORPHEN POLST 10-8 MG/5ML PO LQCR
5.0000 mL | Freq: Two times a day (BID) | ORAL | Status: DC
  Administered 2011-04-02 – 2011-04-11 (×19): 5 mL via ORAL
  Filled 2011-04-02 (×19): qty 5

## 2011-04-02 MED ORDER — SODIUM CHLORIDE 0.9 % IJ SOLN
INTRAMUSCULAR | Status: AC
  Administered 2011-04-02: 13:00:00
  Filled 2011-04-02: qty 3

## 2011-04-02 MED ORDER — SODIUM CHLORIDE 0.9 % IJ SOLN
INTRAMUSCULAR | Status: AC
  Filled 2011-04-02: qty 3

## 2011-04-02 NOTE — Progress Notes (Signed)
Subjective: She says she still having problems with shortness of breath and cough. She is not coughing up much. Her heart rate has still been up somewhat. She's now complaining of left leg pain which is a new problem.  Objective: Vital signs in last 24 hours: Temp:  [97.5 F (36.4 C)-100 F (37.8 C)] 97.5 F (36.4 C) (11/18 0553) Pulse Rate:  [77-126] 118  (11/18 0553) Resp:  [18-24] 24  (11/18 0553) BP: (114-145)/(71-89) 145/89 mmHg (11/18 0553) SpO2:  [89 %-100 %] 92 % (11/18 0921) Weight change:  Last BM Date: 04/01/11  Intake/Output from previous day: 11/17 0701 - 11/18 0700 In: 430 [P.O.:430] Out: 700 [Urine:700]  PHYSICAL EXAM General appearance: alert, cooperative and mild distress Resp: rhonchi bilaterally Cardio: irregularly irregular rhythm GI: soft, non-tender; bowel sounds normal; no masses,  no organomegaly Extremities: extremities normal, atraumatic, no cyanosis or edema  Lab Results:    Basic Metabolic Panel:  Basename 04/02/11 0645 04/01/11 0640  NA 137 140  K 3.3* 3.4*  CL 103 107  CO2 27 24  GLUCOSE 116* 101*  BUN 8 7  CREATININE 0.88 0.91  CALCIUM 9.0 8.9  MG 1.5 --  PHOS -- --   Liver Function Tests:  Basename 03/31/11 0441  AST 12  ALT 12  ALKPHOS 88  BILITOT 0.3  PROT 5.7*  ALBUMIN 2.7*   No results found for this basename: LIPASE:2,AMYLASE:2 in the last 72 hours No results found for this basename: AMMONIA:2 in the last 72 hours CBC:  Basename 04/02/11 0645 04/01/11 0640  WBC 14.0* 19.7*  NEUTROABS -- --  HGB 9.1* 10.4*  HCT 28.4* 32.0*  MCV 90.2 90.1  PLT 257 269   Cardiac Enzymes: No results found for this basename: CKTOTAL:3,CKMB:3,CKMBINDEX:3,TROPONINI:3 in the last 72 hours BNP: No results found for this basename: POCBNP:3 in the last 72 hours D-Dimer: No results found for this basename: DDIMER:2 in the last 72 hours CBG: No results found for this basename: GLUCAP:6 in the last 72 hours Hemoglobin A1C: No results  found for this basename: HGBA1C in the last 72 hours Fasting Lipid Panel: No results found for this basename: CHOL,HDL,LDLCALC,TRIG,CHOLHDL,LDLDIRECT in the last 72 hours Thyroid Function Tests:  Basename 03/31/11 0441  TSH 0.300*  T4TOTAL --  FREET4 --  T3FREE --  THYROIDAB --   Anemia Panel:  Basename 03/31/11 0441  VITAMINB12 203*  FOLATE >20.0  FERRITIN --  TIBC --  IRON --  RETICCTPCT --   Coagulation:  Basename 04/02/11 0645 04/01/11 0640  LABPROT 15.7* 15.6*  INR 1.22 1.21   Urine Drug Screen:  Alcohol Level: No results found for this basename: ETH:2 in the last 72 hours Urinalysis:  Misc. Labs:  ABGS No results found for this basename: PHART,PCO2,PO2ART,TCO2,HCO3 in the last 72 hours CULTURES No results found for this or any previous visit (from the past 240 hour(s)). Studies/Results: Dg Chest 2 View  04/01/2011  *RADIOLOGY REPORT*  Clinical Data: Bronchitis  CHEST - 2 VIEW  Comparison: 03/28/2011  Findings: Interval   development of patchy airspace opacities in the superior and posterior basal segments left lower lobe.  Mild cardiomegaly stable.  Blunting of the left lateral costophrenic angle suggesting small effusion.  Atheromatous aortic arch.  Right lung is clear.  IMPRESSION:  1.  New left lower lobe airspace opacities suggesting pneumonia. 2.  Probable small left pleural effusion.  Original Report Authenticated By: Osa Craver, M.D.    Medications:  Scheduled:   . azithromycin  500 mg Oral Daily   Followed by  . azithromycin  250 mg Oral Daily  . benazepril  5 mg Oral Daily  . calcium-vitamin D  1 tablet Oral Daily  . chlorpheniramine-HYDROcodone  5 mL Oral Q12H  . diltiazem  180 mg Oral Daily  . enoxaparin (LOVENOX) injection  80 mg Subcutaneous Q24H  . levalbuterol  0.63 mg Nebulization Q8H  . LORazepam  0.5 mg Oral BID  . metoprolol tartrate  100 mg Oral BID  . metoprolol tartrate  75 mg Oral Once  . potassium chloride  20 mEq  Oral Daily  . rosuvastatin  10 mg Oral q1800  . tuberculin  5 Units Intradermal Once  . warfarin  5 mg Oral ONCE-1800  . DISCONTD: metoprolol tartrate  50 mg Oral BID  . DISCONTD: metoprolol tartrate  25 mg Oral BID   Continuous:   . sodium chloride 75 mL/hr at 04/01/11 1610   RUE:[id number], HYDROcodone-acetaminophen, LORazepam, traMADol  Assesment: She is admitted with atrial fibrillation with rapid ventricular response and pneumonia. She has a new complaint which is of leg pain. The examination of her leg does not reveal a cause of pain. Active Problems:  * No active hospital problems. *     Plan: She will have x-rays made of her leg. She will have an increase in her metoprolol to see if we can get control of her heart rate. I gave her something else for cough because she's complaining that she gets coughing episodes.    LOS: 5 days   Sheri Matthews L 04/02/2011, 10:24 AM

## 2011-04-02 NOTE — Progress Notes (Signed)
ANTICOAGULATION CONSULT NOTE - follow up  Pharmacy Consult for Lovenox and Coumadin Indication: atrial fibrillation  No Known Allergies  Patient Measurements: Height: 5' (152.4 cm) Weight: 125 lb 12.8 oz (57.063 kg) IBW/kg (Calculated) : 45.5   Vital Signs: Temp: 97.5 F (36.4 C) (11/18 0553) Temp src: Oral (11/18 0553) BP: 145/89 mmHg (11/18 0553) Pulse Rate: 118  (11/18 0553)  Labs:  Basename 04/02/11 0645 04/01/11 0640 03/31/11 0441  HGB 9.1* 10.4* --  HCT 28.4* 32.0* 28.5*  PLT 257 269 220  APTT -- -- --  LABPROT 15.7* 15.6* 15.7*  INR 1.22 1.21 1.22  HEPARINUNFRC -- -- --  CREATININE 0.88 0.91 0.97  CKTOTAL -- -- --  CKMB -- -- --  TROPONINI -- -- --   Estimated Creatinine Clearance: 37 ml/min (by C-G formula based on Cr of 0.88).  Medical History: Past Medical History  Diagnosis Date  . Hypertension   . Arthritis   . Shortness of breath   . Stroke   . GERD (gastroesophageal reflux disease)   . Dysrhythmia   . Pneumonia   . Depression   . Anemia    Medications:  Scheduled:     . azithromycin  500 mg Oral Daily   Followed by  . azithromycin  250 mg Oral Daily  . benazepril  5 mg Oral Daily  . calcium-vitamin D  1 tablet Oral Daily  . diltiazem  180 mg Oral Daily  . enoxaparin (LOVENOX) injection  80 mg Subcutaneous Q24H  . levalbuterol  0.63 mg Nebulization Q8H  . LORazepam  0.5 mg Oral BID  . metoprolol tartrate  50 mg Oral BID  . metoprolol tartrate  25 mg Oral BID  . metoprolol tartrate  75 mg Oral Once  . potassium chloride  20 mEq Oral Daily  . rosuvastatin  10 mg Oral q1800  . warfarin  5 mg Oral ONCE-1800   Assessment: platelet count within normal limits. SCr much improved, renal fxn improved  Goal of Therapy:  Full anticoagulation INR 2-3   Plan:  Lovenox 1.5 mg/kg SQ every 24 hours (80mg ) Coumadin 5mg  today INR daily Monitor renal function  Monitor CBC   Sheri Matthews A 04/02/2011,9:11 AM

## 2011-04-03 LAB — CBC
HCT: 27.2 % — ABNORMAL LOW (ref 36.0–46.0)
Hemoglobin: 8.6 g/dL — ABNORMAL LOW (ref 12.0–15.0)
MCHC: 31.6 g/dL (ref 30.0–36.0)
RBC: 3 MIL/uL — ABNORMAL LOW (ref 3.87–5.11)

## 2011-04-03 LAB — URINALYSIS, ROUTINE W REFLEX MICROSCOPIC
Hgb urine dipstick: NEGATIVE
Protein, ur: NEGATIVE mg/dL
Urobilinogen, UA: 0.2 mg/dL (ref 0.0–1.0)

## 2011-04-03 LAB — URINE MICROSCOPIC-ADD ON

## 2011-04-03 LAB — PROTIME-INR: INR: 1.31 (ref 0.00–1.49)

## 2011-04-03 MED ORDER — IPRATROPIUM BROMIDE 0.02 % IN SOLN
0.5000 mg | RESPIRATORY_TRACT | Status: DC
  Administered 2011-04-03 – 2011-04-04 (×5): 0.5 mg via RESPIRATORY_TRACT
  Filled 2011-04-03 (×7): qty 2.5

## 2011-04-03 MED ORDER — VANCOMYCIN HCL IN DEXTROSE 1-5 GM/200ML-% IV SOLN
1000.0000 mg | INTRAVENOUS | Status: DC
  Administered 2011-04-03 – 2011-04-09 (×7): 1000 mg via INTRAVENOUS
  Filled 2011-04-03 (×9): qty 200

## 2011-04-03 MED ORDER — SODIUM CHLORIDE 0.9 % IJ SOLN
INTRAMUSCULAR | Status: AC
  Filled 2011-04-03: qty 3

## 2011-04-03 MED ORDER — HYDROCODONE-ACETAMINOPHEN 5-325 MG PO TABS
1.0000 | ORAL_TABLET | Freq: Three times a day (TID) | ORAL | Status: DC | PRN
Start: 2011-04-03 — End: 2011-04-11
  Administered 2011-04-05 – 2011-04-10 (×12): 1 via ORAL
  Filled 2011-04-03 (×12): qty 1

## 2011-04-03 MED ORDER — PIPERACILLIN-TAZOBACTAM 3.375 G IVPB
3.3750 g | Freq: Three times a day (TID) | INTRAVENOUS | Status: DC
  Administered 2011-04-03 – 2011-04-05 (×5): 3.375 g via INTRAVENOUS
  Filled 2011-04-03 (×6): qty 50

## 2011-04-03 MED ORDER — IPRATROPIUM-ALBUTEROL 0.5-2.5 (3) MG/3ML IN SOLN: 3.0000 mL | RESPIRATORY_TRACT | Status: DC

## 2011-04-03 MED ORDER — ALBUTEROL SULFATE (5 MG/ML) 0.5% IN NEBU
2.5000 mg | INHALATION_SOLUTION | RESPIRATORY_TRACT | Status: DC
  Administered 2011-04-03 – 2011-04-04 (×5): 2.5 mg via RESPIRATORY_TRACT
  Filled 2011-04-03 (×7): qty 0.5

## 2011-04-03 MED ORDER — ACETAMINOPHEN 325 MG PO TABS
650.0000 mg | ORAL_TABLET | ORAL | Status: DC | PRN
  Administered 2011-04-03 – 2011-04-11 (×4): 650 mg via ORAL
  Filled 2011-04-03 (×3): qty 2

## 2011-04-03 MED ORDER — WARFARIN SODIUM 5 MG PO TABS
5.0000 mg | ORAL_TABLET | Freq: Once | ORAL | Status: AC
  Administered 2011-04-03: 5 mg via ORAL
  Filled 2011-04-03: qty 1

## 2011-04-03 NOTE — Plan of Care (Signed)
Problem: Phase III Progression Outcomes Goal: Pain controlled on oral analgesia Outcome: Not Applicable Date Met:  04/03/11 Patient has chronic back pain

## 2011-04-03 NOTE — Progress Notes (Signed)
[id number] 

## 2011-04-03 NOTE — Consult Note (Signed)
ANTIBIOTIC CONSULT NOTE - INITIAL  Pharmacy Consult for vancomycin and zosyn Indication: rule out pneumonia  No Known Allergies  Patient Measurements: Height: 5' (152.4 cm) Weight: 125 lb 12.8 oz (57.063 kg) IBW/kg (Calculated) : 45.5   Vital Signs: Temp: 98.9 F (37.2 C) (11/19 0600) Temp src: Oral (11/19 0600) BP: 120/69 mmHg (11/19 0600) Pulse Rate: 119  (11/19 0600) Intake/Output from previous day: 11/18 0701 - 11/19 0700 In: 960 [P.O.:960] Out: 804 [Urine:803; Stool:1] Intake/Output from this shift: Total I/O In: -  Out: 200 [Urine:200]  Labs:  [facility] 04/03/11 0452 04/02/11 0645 04/01/11 0640  WBC 12.6* 14.0* 19.7*  HGB 8.6* 9.1* 10.4*  PLT 269 257 269  LABCREA -- -- --  CREATININE -- 0.88 0.91   Estimated Creatinine Clearance: 37 ml/min (by C-G formula based on Cr of 0.88). No results found for this basename: VANCOTROUGH:2,VANCOPEAK:2,VANCORANDOM:2,GENTTROUGH:2,GENTPEAK:2,GENTRANDOM:2,TOBRATROUGH:2,TOBRAPEAK:2,TOBRARND:2,AMIKACINPEAK:2,AMIKACINTROU:2,AMIKACIN:2, in the last 72 hours   Microbiology: No results found for this or any previous visit (from the past 720 hour(s)).  Medical History: Past Medical History  Diagnosis Date  . Hypertension   . Arthritis   . Shortness of breath   . Stroke   . GERD (gastroesophageal reflux disease)   . Dysrhythmia   . Pneumonia   . Depression   . Anemia    Medications:  Scheduled:    . azithromycin  250 mg Oral Daily  . benazepril  5 mg Oral Daily  . calcium-vitamin D  1 tablet Oral Daily  . chlorpheniramine-HYDROcodone  5 mL Oral Q12H  . diltiazem  180 mg Oral Daily  . enoxaparin (LOVENOX) injection  80 mg Subcutaneous Q24H  . ipratropium-albuterol  3 mL Nebulization Q4H  . levalbuterol  0.63 mg Nebulization Q8H  . LORazepam  0.5 mg Oral BID  . metoprolol tartrate  100 mg Oral BID  . piperacillin-tazobactam (ZOSYN)  IV  3.375 g Intravenous Q8H  . rosuvastatin  10 mg Oral q1800  . sodium chloride      .  sodium chloride      . tuberculin  5 Units Intradermal Once  . vancomycin  1,000 mg Intravenous Q24H  . warfarin  5 mg Oral ONCE-1800  . warfarin  5 mg Oral ONCE-1800   Assessment: Decreased renal fxn (clcr ~37 ml/min)  Goal of Therapy:  Vancomycin trough level 15-20 mcg/ml  Plan: Zosyn 3.375gm iv q8hrs (when ClCr > 20) Vancomycin 1gm iv q24hrs Check trough at steady state Labs per protocol  Valrie Hart A 04/03/2011,3:30 PM

## 2011-04-03 NOTE — Progress Notes (Signed)
ANTICOAGULATION CONSULT NOTE - follow up  Pharmacy Consult for Lovenox and Coumadin Indication: atrial fibrillation  No Known Allergies  Patient Measurements: Height: 5' (152.4 cm) Weight: 125 lb 12.8 oz (57.063 kg) IBW/kg (Calculated) : 45.5   Vital Signs: Temp: 98.9 F (37.2 C) (11/19 0600) Temp src: Oral (11/19 0600) BP: 120/69 mmHg (11/19 0600) Pulse Rate: 119  (11/19 0600)  Labs:  Basename 04/03/11 0452 04/02/11 0645 04/01/11 0640  HGB 8.6* 9.1* --  HCT 27.2* 28.4* 32.0*  PLT 269 257 269  APTT -- -- --  LABPROT 16.5* 15.7* 15.6*  INR 1.31 1.22 1.21  HEPARINUNFRC -- -- --  CREATININE -- 0.88 0.91  CKTOTAL -- -- --  CKMB -- -- --  TROPONINI -- -- --   Estimated Creatinine Clearance: 37 ml/min (by C-G formula based on Cr of 0.88).  Medical History: Past Medical History  Diagnosis Date  . Hypertension   . Arthritis   . Shortness of breath   . Stroke   . GERD (gastroesophageal reflux disease)   . Dysrhythmia   . Pneumonia   . Depression   . Anemia    Medications:  Scheduled:     . azithromycin  250 mg Oral Daily  . benazepril  5 mg Oral Daily  . calcium-vitamin D  1 tablet Oral Daily  . chlorpheniramine-HYDROcodone  5 mL Oral Q12H  . diltiazem  180 mg Oral Daily  . enoxaparin (LOVENOX) injection  80 mg Subcutaneous Q24H  . levalbuterol  0.63 mg Nebulization Q8H  . LORazepam  0.5 mg Oral BID  . metoprolol tartrate  100 mg Oral BID  . rosuvastatin  10 mg Oral q1800  . sodium chloride      . sodium chloride      . sodium chloride      . tuberculin  5 Units Intradermal Once  . warfarin  5 mg Oral ONCE-1800  . warfarin  5 mg Oral ONCE-1800   Assessment: platelet count within normal limits. clcr > 30  Goal of Therapy:  Full anticoagulation with Lovenox until INR therapeutic INR 2-3   Plan:  Lovenox 1.5 mg/kg SQ every 24 hours (80mg ) Coumadin 5mg  today INR daily Monitor renal function  Monitor CBC   Turhan Chill A 04/03/2011,10:35  AM

## 2011-04-03 NOTE — Progress Notes (Signed)
NAME:  LEXIANA, SPINDEL NO.:  [id number]  MEDICAL RECORD NO.:  [id number]  LOCATION:  A301                          FACILITY:  [facility]  PHYSICIAN:  Melvyn Novas, MDDATE OF BIRTH:  January 24, 1924  DATE OF PROCEDURE: DATE OF DISCHARGE:                                PROGRESS NOTE   Problems are acute atrial fibrillation, normal systolic function. Ejection fraction 75%.  Moderately enlarged left atrium.  New left lower lobe infiltrate.  Coughing, placed on Zithromax empirically.  Chest x- ray reveals patchy infiltrate in left lower lobe, which is new since admission.  Lungs show scattered rhonchi, both bases, left greater than right, prolonged expiratory phase.  No rales audible.  No wheeze. Heart:  Irregularly irregular.  No S3.  Lopressor increased to 100 p.o. b.i.d. along with Cardizem IV for rate control.  Temperature 98.9, blood pressure 120/69, pulse is 104, and irregularly irregular, respiratory rate is 20, O2 sat is 92%.  The plan right now is to add vancomycin and Zosyn IV per pharmacy protocol for health care acquired pneumonia.  Add DuoNeb nebulizer q.4 h. p.r.n.  Monitor CBC and BMET in a.m. and make further recommendations as the database expands.  Continue Coumadin as per pharmacy protocol.     Melvyn Novas, MD     RMD/MEDQ  D:  04/03/2011  T:  04/03/2011  Job:  [id number]

## 2011-04-03 NOTE — Progress Notes (Signed)
CSW was consulted on this Pt for possible placement.  Pt's family has consulted with care management and have determined they wish to take Pt home with home health care and an aid.  CSW will sign off at this time.

## 2011-04-04 LAB — CBC
MCHC: 31.5 g/dL (ref 30.0–36.0)
Platelets: 303 10*3/uL (ref 150–400)
RDW: 14.5 % (ref 11.5–15.5)
WBC: 10 10*3/uL (ref 4.0–10.5)

## 2011-04-04 LAB — PROTIME-INR
INR: 2.54 — ABNORMAL HIGH (ref 0.00–1.49)
Prothrombin Time: 27.8 seconds — ABNORMAL HIGH (ref 11.6–15.2)

## 2011-04-04 LAB — DIFFERENTIAL
Basophils Absolute: 0 10*3/uL (ref 0.0–0.1)
Basophils Relative: 0 % (ref 0–1)
Lymphocytes Relative: 18 % (ref 12–46)
Neutro Abs: 6.6 10*3/uL (ref 1.7–7.7)

## 2011-04-04 LAB — BASIC METABOLIC PANEL
CO2: 25 mEq/L (ref 19–32)
Calcium: 9 mg/dL (ref 8.4–10.5)
Chloride: 104 mEq/L (ref 96–112)
GFR calc Af Amer: 53 mL/min — ABNORMAL LOW (ref >90)
Sodium: 139 mEq/L (ref 135–145)

## 2011-04-04 MED ORDER — ALBUTEROL SULFATE (5 MG/ML) 0.5% IN NEBU
2.5000 mg | INHALATION_SOLUTION | RESPIRATORY_TRACT | Status: DC
  Administered 2011-04-05 (×3): 2.5 mg via RESPIRATORY_TRACT
  Filled 2011-04-04 (×4): qty 0.5

## 2011-04-04 MED ORDER — IPRATROPIUM BROMIDE 0.02 % IN SOLN
0.5000 mg | RESPIRATORY_TRACT | Status: DC
  Administered 2011-04-05 (×3): 0.5 mg via RESPIRATORY_TRACT
  Filled 2011-04-04 (×4): qty 2.5

## 2011-04-04 MED ORDER — BOOST / RESOURCE BREEZE PO LIQD
1.0000 | Freq: Three times a day (TID) | ORAL | Status: DC
  Administered 2011-04-04 – 2011-04-08 (×10): 1 via ORAL
  Filled 2011-04-04 (×19): qty 1

## 2011-04-04 NOTE — Progress Notes (Signed)
Physical Therapy Evaluation Patient Details Name: Sheri Matthews MRN: [id number] DOB: Feb 25, 1924 Today's Date: 04/04/2011  Problem List:  Patient Active Problem List  Diagnoses  . ANEMIA  . PUD  . DIARRHEA    Past Medical History:  Past Medical History  Diagnosis Date  . Hypertension   . Arthritis   . Shortness of breath   . Stroke   . GERD (gastroesophageal reflux disease)   . Dysrhythmia   . Pneumonia   . Depression   . Anemia    Past Surgical History:  Past Surgical History  Procedure Date  . Back surgery   . Ovaries removed    PT Assessment/Plan/Recommendation PT Assessment Clinical Impression Statement: pt is severely deconditioned and appears to have acute joint pain/rigidity in all extremeties...she refused to try to get OOB this AM...is only able to do pivot transfer to chair with nursing service...unable to walk due to weakness, pain...she seems to have no pain at rest...recommend SNF at d/c PT Recommendation/Assessment: Patient will need skilled PT in the acute care venue PT Problem List: Decreased strength;Decreased range of motion;Decreased activity tolerance;Decreased mobility;Decreased knowledge of use of DME;Decreased safety awareness;Decreased knowledge of precautions;Pain PT Therapy Diagnosis : Difficulty walking;Acute pain;Generalized weakness PT Plan PT Frequency: Min 3X/week PT Treatment/Interventions: DME instruction;Gait training;Functional mobility training;Therapeutic exercise;Patient/family education PT Recommendation Follow Up Recommendations: Skilled nursing facility Equipment Recommended: Defer to next venue PT Goals  Acute Rehab PT Goals PT Goal Formulation: With patient Time For Goal Achievement: 2 weeks Pt will go Supine/Side to Sit: with mod assist Pt will go Sit to Supine/Side: with mod assist Pt will Transfer Sit to Stand/Stand to Sit: with mod assist Pt will Transfer Bed to Chair/Chair to Bed: with mod assist Pt will Ambulate: 1  - 15 feet;with max assist  PT Evaluation Precautions/Restrictions  Precautions Precautions: Fall Required Braces or Orthoses: No Restrictions Weight Bearing Restrictions: No Prior Functioning  Home Living Lives With: Alone Receives Help From: Family Type of Home: Mobile home Home Layout: One level Home Access: Stairs to enter Entrance Stairs-Rails: Right Entrance Stairs-Number of Steps: 4 Home Adaptive Equipment: Walker - rolling Prior Function Level of Independence: Independent with basic ADLs;Independent with gait;Independent with transfers;Independent with homemaking with ambulation (recent falls at home) Driving: Yes Vocation: Retired Financial risk analyst Arousal/Alertness: Awake/alert Overall Cognitive Status: Appears within functional limits for tasks assessed Orientation Level: Oriented X4 Sensation/Coordination Sensation Light Touch: Appears Intact Stereognosis: Not tested Hot/Cold: Not tested Proprioception: Not tested Coordination Gross Motor Movements are Fluid and Coordinated: Not tested Fine Motor Movements are Fluid and Coordinated: Not tested Extremity Assessment RUE Assessment RUE Assessment: Exceptions to [facility] RUE PROM (degrees) RUE Overall PROM Comments: severe limitation of ROM in all joints...unable to get hand to mouth LUE Assessment LUE Assessment: Exceptions to [facility] LUE PROM (degrees) LUE Overall PROM Comments: severe joint ROM limitation..unable to get hand to mouth RLE Assessment RLE Assessment: Exceptions to [facility] RLE PROM (degrees) RLE Overall PROM Comments: only 30 deg knee flex LLE Assessment LLE Assessment: Exceptions to WFL LLE PROM (degrees) LLE Overall PROM Comments: 30 deg hip/ knee flex...pain with all movement...pt states that she is unable to bear weight of L foot due to pain in ankle Mobility (including Balance) Bed Mobility Bed Mobility: No (refuses) Transfers Transfers: No (refuses) Ambulation/Gait Ambulation/Gait:  No Stairs: No Wheelchair Mobility Wheelchair Mobility: No    Exercise    End of Session PT - End of Session Equipment Utilized During Treatment: Gait belt Activity Tolerance: Patient limited by  fatigue Patient left: in bed;with call bell in reach;with bed alarm set General Behavior During Session: Flat affect Cognition: [facility] for tasks performed  Konrad Penta 04/04/2011, 10:49 AM

## 2011-04-04 NOTE — Progress Notes (Signed)
NAME:  Sheri Matthews, HASELEY NO.:  [id number]  MEDICAL RECORD NO.:  [id number]  LOCATION:  A301                          FACILITY:  [facility]  PHYSICIAN:  Melvyn Novas, MDDATE OF BIRTH:  May 25, 1923  DATE OF PROCEDURE: DATE OF DISCHARGE:                                PROGRESS NOTE   The patient has chronic atrial fibrillation, currently on Lovenox and Coumadin with anemia, ventricular response control to 78 90. Temperature 99.7, blood pressure 110/56, respiratory rate is 20, pulse is 82 and regular.  Hemoglobin 8.7 stable, potassium 3.3 mildly low, INR 2.5.  PLAN:  Right now is to discontinue Lovenox and continue Coumadin.  Check CBC and BMET in a.m.  Continue vancomycin and Zosyn, DuoNeb nebulizer, incentive spirometry, aggressive physical therapy.  The patient will go to nursing center for vigorous rehab.  We will consider transfusion if stools for occult blood is negative thus far.  Lungs are clear.  No rales, wheeze, or rhonchi appreciable.  Heart regular rhythm.  No S3, S4.  Neck shows no JVD.     Melvyn Novas, MD     RMD/MEDQ  D:  04/04/2011  T:  04/04/2011  Job:  [id number]

## 2011-04-04 NOTE — Progress Notes (Signed)
PT now recommending SNF for pt.  CSW spoke with pt's daughter who requests CSW to initiate bed search.  CSW will follow up with bed offers when available.   Sheri Matthews

## 2011-04-04 NOTE — Progress Notes (Signed)
INITIAL ADULT NUTRITION ASSESSMENT Date: 04/04/2011   Time: 1:53 PM Reason for Assessment: Increased LOS (7 days)  ASSESSMENT: Female 77 y.o.  Dx: <principal problem not specified>  Hx:  Past Medical History  Diagnosis Date  . Hypertension   . Arthritis   . Shortness of breath   . Stroke   . GERD (gastroesophageal reflux disease)   . Dysrhythmia   . Pneumonia   . Depression   . Anemia     Related Meds:  Scheduled Meds:   . albuterol  2.5 mg Nebulization Q4H   And  . ipratropium  0.5 mg Nebulization Q4H  . azithromycin  250 mg Oral Daily  . benazepril  5 mg Oral Daily  . calcium-vitamin D  1 tablet Oral Daily  . chlorpheniramine-HYDROcodone  5 mL Oral Q12H  . diltiazem  180 mg Oral Daily  . enoxaparin (LOVENOX) injection  80 mg Subcutaneous Q24H  . levalbuterol  0.63 mg Nebulization Q8H  . LORazepam  0.5 mg Oral BID  . metoprolol tartrate  100 mg Oral BID  . piperacillin-tazobactam (ZOSYN)  IV  3.375 g Intravenous Q8H  . rosuvastatin  10 mg Oral q1800  . sodium chloride      . tuberculin  5 Units Intradermal Once  . vancomycin  1,000 mg Intravenous Q24H  . warfarin  5 mg Oral ONCE-1800  . DISCONTD: ipratropium-albuterol  3 mL Nebulization Q4H   Continuous Infusions:   . sodium chloride 75 mL/hr at 04/04/11 0530   PRN Meds:.acetaminophen, guaiFENesin, HYDROcodone-acetaminophen, LORazepam, traMADol, DISCONTD: HYDROcodone-acetaminophen   Ht: 5' (152.4 cm)  Wt: 125 lb 12.8 oz (57.063 kg)  Ideal Wt: 45.5 kg  % Ideal Wt: 125%  Usual Wt: 132# % Usual Wt: 95%  Body mass index is 24.57 kg/(m^2).  Food/Nutrition Related Hx: Pt reports that she is feeling better. Pt daughters report that pt is planning to go to SNF upon discharge for therapy. Pt daughters report that pt weight usually ranges between 130-140#; her most recent weight was 132# 1 month ago. Noted 7# or 5.3% weight loss x 1 month, although weight change may be variable due to use of different scales. Pt  with poor appetite; PO: 0-75%, with decline. Pt reports she no longer has the taste for food, has poor appetite, and little desire to eat. Pt has not attempted her lunch try and her daughter report that she did not eat much of her dinner last night, even though it was her favorite food, chicken. Pt daughters deny any chewing or swallowing issues; she has no diet restrictions at home. Pt daughters reports that pt has drank Ensure before, but has not drank much in the hospital. They are interested in trying [employer].   Labs:  CBC    Component Value Date/Time   WBC 10.0 04/04/2011 0458   RBC 3.07* 04/04/2011 0458   HGB 8.7* 04/04/2011 0458   HCT 27.6* 04/04/2011 0458   PLT 303 04/04/2011 0458   MCV 89.9 04/04/2011 0458   MCH 28.3 04/04/2011 0458   MCHC 31.5 04/04/2011 0458   RDW 14.5 04/04/2011 0458   LYMPHSABS 1.8 04/04/2011 0458   MONOABS 1.4* 04/04/2011 0458   EOSABS 0.2 04/04/2011 0458   BASOSABS 0.0 04/04/2011 0458     CMP     Component Value Date/Time   NA 139 04/04/2011 0458   K 3.3* 04/04/2011 0458   CL 104 04/04/2011 0458   CO2 25 04/04/2011 0458   GLUCOSE 99 04/04/2011 0458  BUN 9 04/04/2011 0458   CREATININE 1.07 04/04/2011 0458   CALCIUM 9.0 04/04/2011 0458   PROT 5.7* 03/31/2011 0441   ALBUMIN 2.7* 03/31/2011 0441   AST 12 03/31/2011 0441   ALT 12 03/31/2011 0441   ALKPHOS 88 03/31/2011 0441   BILITOT 0.3 03/31/2011 0441   GFRNONAA 46* 04/04/2011 0458   GFRAA 53* 04/04/2011 0458      Intake:   Intake/Output Summary (Last 24 hours) at 04/04/11 1427 Last data filed at 04/04/11 4098  Gross per 24 hour  Intake 5451.25 ml  Output    450 ml  Net 5001.25 ml     Diet Order: Sodium Restricted  Supplements/Tube Feeding: none at this time  IVF:    sodium chloride Last Rate: 75 mL/hr at 04/04/11 0530    Estimated Nutritional Needs:   Kcal:1189-1297 kcals daily Protein:46-57 grams protein daily Fluid:1.2-1.3 L fluid daily  NUTRITION  DIAGNOSIS: -Inadequate oral intake (NI-2.1).  Status: Ongoing  RELATED TO: changes in appetite, poor po intake, weight loss  AS EVIDENCE BY: PO: 0-75%, with decline, 5.3% weight loss x 1 month.  MONITORING/EVALUATION(Goals): 1) Pt will meet >75% of estimated energy needs 2) Pt will maintain current weight of 125#  EDUCATION NEEDS: -Education needs addressed  INTERVENTION: Educated pt and pt daughters on importance of adequate PO intake to promote healing.  Add Resource Breeze TID. Consider liberalize diet to regular, in light of poor PO intake.   Dietitian #: [phone]  DOCUMENTATION CODES Per approved criteria  -Severe malnutrition in the context of acute illness or injury    Orlene Plum 04/04/2011, 1:53 PM

## 2011-04-04 NOTE — Progress Notes (Signed)
Contacted by PT Debarah Crape who is now recommending snf for short term rehab. Spoke to pts daughter Rivka Barbara who states family in agreement. Referral to sw.

## 2011-04-04 NOTE — Progress Notes (Signed)
[id number] 

## 2011-04-05 LAB — CBC
Hemoglobin: 8.2 g/dL — ABNORMAL LOW (ref 12.0–15.0)
MCH: 27.8 pg (ref 26.0–34.0)
MCHC: 31.4 g/dL (ref 30.0–36.0)
Platelets: 305 10*3/uL (ref 150–400)
RBC: 2.95 MIL/uL — ABNORMAL LOW (ref 3.87–5.11)

## 2011-04-05 LAB — BASIC METABOLIC PANEL
Calcium: 8.6 mg/dL (ref 8.4–10.5)
GFR calc non Af Amer: 25 mL/min — ABNORMAL LOW (ref >90)
Glucose, Bld: 88 mg/dL (ref 70–99)
Potassium: 3 mEq/L — ABNORMAL LOW (ref 3.5–5.1)
Sodium: 139 mEq/L (ref 135–145)

## 2011-04-05 LAB — VANCOMYCIN, TROUGH: Vancomycin Tr: 17.5 ug/mL (ref 10.0–20.0)

## 2011-04-05 LAB — PROTIME-INR
INR: 2.83 — ABNORMAL HIGH (ref 0.00–1.49)
Prothrombin Time: 30.2 seconds — ABNORMAL HIGH (ref 11.6–15.2)

## 2011-04-05 MED ORDER — IPRATROPIUM BROMIDE 0.02 % IN SOLN: 0.5000 mg | RESPIRATORY_TRACT | Status: DC | PRN

## 2011-04-05 MED ORDER — ALBUTEROL SULFATE (5 MG/ML) 0.5% IN NEBU: 2.5000 mg | INHALATION_SOLUTION | RESPIRATORY_TRACT | Status: DC | PRN

## 2011-04-05 MED ORDER — POTASSIUM CHLORIDE CRYS ER 20 MEQ PO TBCR
20.0000 meq | EXTENDED_RELEASE_TABLET | Freq: Two times a day (BID) | ORAL | Status: AC
  Administered 2011-04-05 – 2011-04-06 (×4): 20 meq via ORAL
  Filled 2011-04-05 (×4): qty 1

## 2011-04-05 MED ORDER — PIPERACILLIN-TAZOBACTAM IN DEX 2-0.25 GM/50ML IV SOLN
2.2500 g | Freq: Three times a day (TID) | INTRAVENOUS | Status: DC
  Administered 2011-04-05 – 2011-04-11 (×17): 2.25 g via INTRAVENOUS
  Filled 2011-04-05 (×23): qty 50

## 2011-04-05 MED ORDER — WARFARIN SODIUM 2.5 MG PO TABS
2.5000 mg | ORAL_TABLET | Freq: Once | ORAL | Status: AC
  Administered 2011-04-05: 2.5 mg via ORAL
  Filled 2011-04-05: qty 1

## 2011-04-05 NOTE — Progress Notes (Signed)
UR Chart Review Completed  

## 2011-04-05 NOTE — Progress Notes (Signed)
NAMEMARKESIA, CRILLY NO.:  [id number]  MEDICAL RECORD NO.:  [id number]  LOCATION:  A301                          FACILITY:  [facility]  PHYSICIAN:  Melvyn Novas, MDDATE OF BIRTH:  Sep 12, 1923  DATE OF PROCEDURE: DATE OF DISCHARGE:                                PROGRESS NOTE   PROBLEMS: 1. Left lower lobe infiltrate. 2. Atrial fibrillation which is new in onset with a rapid ventricular     response, now controlled with moderate ventricular response. 3. Ejection fraction of 75% with moderately dilated LA. 4. Anemia, partially dilutional.  Stools for occult blood are negative     thus far.  However, she feels weak and is not participating in     physical therapy as much.  Lungs show scattered rhonchi, left greater than right.  No rales audible.  The patient is alert and oriented.  Heart irregularly regular. No S3 auscultated, ventricular response approximately 80.  Blood pressure 115/63, temperature 98.4, pulse is 70 and regular.  Respiratory rate is 20.  Hemoglobin 8.2, WBC 10.8, creatinine jumped from 1.07-1.76.  INR is 2.83, on Coumadin.  The plan right now is to transfuse 2 units of packed cells, increase O2 carrying capacity and strengthening the dizziness the patient may feels. Continue vanc and Zosyn for left lower lobe infiltrate.  Continue DuoNeb nebulizer, incentive spirometry, aggressive physical therapy.  The patient understands that important to participate in therapy and add potassium chloride 40 mEq p.o. daily.  Check potassium in a.m. as it is 3.0.     Melvyn Novas, MD     RMD/MEDQ  D:  04/05/2011  T:  04/05/2011  Job:  [id number]

## 2011-04-05 NOTE — Progress Notes (Addendum)
ANTICOAGULATION CONSULT NOTE - follow up  Pharmacy Consult for Coumadin Indication: atrial fibrillation  No Known Allergies  Patient Measurements: Height: 5' (152.4 cm) Weight: 125 lb 12.8 oz (57.063 kg) IBW/kg (Calculated) : 45.5   Vital Signs: Temp: 98.4 F (36.9 C) (11/21 0524) Temp src: Oral (11/21 0524) BP: 115/63 mmHg (11/21 0524) Pulse Rate: 63  (11/21 0524)  Labs:  Basename 04/05/11 0442 04/04/11 1106 04/04/11 0458 04/03/11 0452  HGB 8.2* -- 8.7* --  HCT 26.1* -- 27.6* 27.2*  PLT 305 -- 303 269  APTT -- -- -- --  LABPROT 30.2* 27.8* -- 16.5*  INR 2.83* 2.54* -- 1.31  HEPARINUNFRC -- -- -- --  CREATININE 1.76* -- 1.07 --  CKTOTAL -- -- -- --  CKMB -- -- -- --  TROPONINI -- -- -- --   Estimated Creatinine Clearance: 18.5 ml/min (by C-G formula based on Cr of 1.76).  Medical History: Past Medical History  Diagnosis Date  . Hypertension   . Arthritis   . Shortness of breath   . Stroke   . GERD (gastroesophageal reflux disease)   . Dysrhythmia   . Pneumonia   . Depression   . Anemia    Medications:  Scheduled:     . albuterol  2.5 mg Nebulization Q4H WA  . azithromycin  250 mg Oral Daily  . benazepril  5 mg Oral Daily  . calcium-vitamin D  1 tablet Oral Daily  . chlorpheniramine-HYDROcodone  5 mL Oral Q12H  . diltiazem  180 mg Oral Daily  . feeding supplement  1 Container Oral TID BM  . ipratropium  0.5 mg Nebulization Q4H WA  . levalbuterol  0.63 mg Nebulization Q8H  . LORazepam  0.5 mg Oral BID  . metoprolol tartrate  100 mg Oral BID  . piperacillin-tazobactam (ZOSYN)  IV  3.375 g Intravenous Q8H  . rosuvastatin  10 mg Oral q1800  . tuberculin  5 Units Intradermal Once  . vancomycin  1,000 mg Intravenous Q24H  . DISCONTD: albuterol  2.5 mg Nebulization Q4H  . DISCONTD: enoxaparin (LOVENOX) injection  80 mg Subcutaneous Q24H  . DISCONTD: ipratropium  0.5 mg Nebulization Q4H   Assessment: Platelet count within normal limits Stopped Warfarin  patient now with therapeutic INR.   Goal of Therapy:  INR 2-3   Plan:  Coumadin 2.5mg  today INR daily Monitor CBC   Mady Gemma 04/05/2011,8:04 AM   Adjusted Zosyn dose for decreased renal function. Mady Gemma 04/05/2011 10:42 AM

## 2011-04-05 NOTE — Progress Notes (Signed)
CSW presented bed offers to pt's daughter who chooses [facility].  Family has completed paperwork in anticipation of possible Friday d/c to SNF.  CSW to continue to follow.  [name]

## 2011-04-05 NOTE — Progress Notes (Signed)
Physical Therapy Treatment Patient Details Name: Sheri Matthews MRN: [id number] DOB: 1923-10-06 Today's Date: 04/05/2011  TIME:1200-1212/ GT  PT Assessment/Plan  PT - Assessment/Plan Comments on Treatment Session: After several minutes of encouragement and involvement of daughters' threatening a nursing home pt agreed to work with therapy participating in gait training only. Pt was min/mod A with RW due to antalgic gait secondary to very painful LEs, pt amb 4' and refused any other therapy  PT Goals  Acute Rehab PT Goals PT Goal: Ambulate - Progress: Progressing toward goal  PT Treatment Precautions/Restrictions  Precautions Precautions: Fall Required Braces or Orthoses: No Restrictions Weight Bearing Restrictions: No Mobility (including Balance) Ambulation/Gait Ambulation/Gait: Yes Ambulation/Gait Assistance: 4: Min assist Ambulation/Gait Assistance Details (indicate cue type and reason): [id number] cues to use UE to relieve weight from LEs which were extremely painful Ambulation Distance (Feet): 4 Feet Assistive device: Rolling walker Gait Pattern: Trunk flexed;Antalgic Gait velocity: extremely slow Stairs: No Wheelchair Mobility Wheelchair Mobility: No    Exercise    End of Session PT - End of Session Equipment Utilized During Treatment: Gait belt Activity Tolerance: Patient limited by pain Patient left: in bed;with call bell in reach;with bed alarm set;with family/visitor present Nurse Communication: Mobility status for transfers General Behavior During Session: [facility] for tasks performed Cognition: [facility] for tasks performed  Saman Umstead ATKINSO 04/05/2011, 12:27 PM

## 2011-04-05 NOTE — Progress Notes (Signed)
[id number] 

## 2011-04-06 ENCOUNTER — Inpatient Hospital Stay (HOSPITAL_COMMUNITY): Payer: Medicare Other

## 2011-04-06 LAB — BASIC METABOLIC PANEL
BUN: 11 mg/dL (ref 6–23)
CO2: 24 mEq/L (ref 19–32)
Calcium: 8.9 mg/dL (ref 8.4–10.5)
Chloride: 105 mEq/L (ref 96–112)
Creatinine, Ser: 2.11 mg/dL — ABNORMAL HIGH (ref 0.50–1.10)
Glucose, Bld: 93 mg/dL (ref 70–99)

## 2011-04-06 LAB — CBC
HCT: 32.1 % — ABNORMAL LOW (ref 36.0–46.0)
Hemoglobin: 10.7 g/dL — ABNORMAL LOW (ref 12.0–15.0)
MCH: 28.9 pg (ref 26.0–34.0)
MCV: 86.8 fL (ref 78.0–100.0)
Platelets: 329 10*3/uL (ref 150–400)
RBC: 3.7 MIL/uL — ABNORMAL LOW (ref 3.87–5.11)
WBC: 11.9 10*3/uL — ABNORMAL HIGH (ref 4.0–10.5)

## 2011-04-06 LAB — PROTIME-INR: Prothrombin Time: 36.2 seconds — ABNORMAL HIGH (ref 11.6–15.2)

## 2011-04-06 NOTE — Progress Notes (Signed)
[id number] 

## 2011-04-06 NOTE — Progress Notes (Signed)
ANTICOAGULATION CONSULT NOTE - follow up  Pharmacy Consult for Coumadin Indication: atrial fibrillation  No Known Allergies  Patient Measurements: Height: 5' (152.4 cm) Weight: 125 lb 12.8 oz (57.063 kg) IBW/kg (Calculated) : 45.5   Vital Signs: Temp: 98.5 F (36.9 C) (11/22 0523) Temp src: Oral (11/22 0523) BP: 127/67 mmHg (11/22 0523) Pulse Rate: 64  (11/22 0523)  Labs:  Basename 04/06/11 0542 04/05/11 0442 04/04/11 1106 04/04/11 0458  HGB 10.7* 8.2* -- --  HCT 32.1* 26.1* -- 27.6*  PLT 329 305 -- 303  APTT -- -- -- --  LABPROT 36.2* 30.2* 27.8* --  INR 3.57* 2.83* 2.54* --  HEPARINUNFRC -- -- -- --  CREATININE 2.11* 1.76* -- 1.07  CKTOTAL -- -- -- --  CKMB -- -- -- --  TROPONINI -- -- -- --   Estimated Creatinine Clearance: 15.4 ml/min (by C-G formula based on Cr of 2.11).  Medical History: Past Medical History  Diagnosis Date  . Hypertension   . Arthritis   . Shortness of breath   . Stroke   . GERD (gastroesophageal reflux disease)   . Dysrhythmia   . Pneumonia   . Depression   . Anemia    Medications:  Scheduled:     . azithromycin  250 mg Oral Daily  . benazepril  5 mg Oral Daily  . calcium-vitamin D  1 tablet Oral Daily  . chlorpheniramine-HYDROcodone  5 mL Oral Q12H  . diltiazem  180 mg Oral Daily  . feeding supplement  1 Container Oral TID BM  . levalbuterol  0.63 mg Nebulization Q8H  . LORazepam  0.5 mg Oral BID  . metoprolol tartrate  100 mg Oral BID  . piperacillin-tazobactam (ZOSYN)  IV  2.25 g Intravenous Q8H  . potassium chloride  20 mEq Oral BID  . rosuvastatin  10 mg Oral q1800  . tuberculin  5 Units Intradermal Once  . vancomycin  1,000 mg Intravenous Q24H  . warfarin  2.5 mg Oral ONCE-1800  . DISCONTD: albuterol  2.5 mg Nebulization Q4H WA  . DISCONTD: ipratropium  0.5 mg Nebulization Q4H WA  . DISCONTD: piperacillin-tazobactam (ZOSYN)  IV  3.375 g Intravenous Q8H   Assessment: INR 3.57 today, jump from 2.83  Goal of  Therapy:  INR 2-3   Plan:  No Coumadin today INR daily Monitor CBC   Lucianna Ostlund Bennett 04/06/2011,8:46 AM   Adjusted Zosyn dose for decreased renal function. Josephine Igo 04/06/2011 8:46 AM

## 2011-04-06 NOTE — Progress Notes (Signed)
Two units of blood given per MD order.  Patient tolerated both units well with no reaction noted with patient.

## 2011-04-07 LAB — TYPE AND SCREEN
Antibody Screen: NEGATIVE
Unit division: 0

## 2011-04-07 LAB — CBC
HCT: 33 % — ABNORMAL LOW (ref 36.0–46.0)
Hemoglobin: 10.8 g/dL — ABNORMAL LOW (ref 12.0–15.0)
MCH: 28.6 pg (ref 26.0–34.0)
RBC: 3.78 MIL/uL — ABNORMAL LOW (ref 3.87–5.11)

## 2011-04-07 LAB — PROTIME-INR
INR: 3.9 — ABNORMAL HIGH (ref 0.00–1.49)
Prothrombin Time: 38.8 seconds — ABNORMAL HIGH (ref 11.6–15.2)

## 2011-04-07 LAB — BASIC METABOLIC PANEL
CO2: 23 mEq/L (ref 19–32)
Chloride: 107 mEq/L (ref 96–112)
Potassium: 3.5 mEq/L (ref 3.5–5.1)
Sodium: 141 mEq/L (ref 135–145)

## 2011-04-07 LAB — DIFFERENTIAL
Basophils Absolute: 0 10*3/uL (ref 0.0–0.1)
Lymphocytes Relative: 12 % (ref 12–46)
Lymphs Abs: 1.4 10*3/uL (ref 0.7–4.0)
Neutro Abs: 8.1 10*3/uL — ABNORMAL HIGH (ref 1.7–7.7)

## 2011-04-07 MED ORDER — SODIUM CHLORIDE 0.9 % IJ SOLN
INTRAMUSCULAR | Status: AC
  Administered 2011-04-07: 10 mL
  Filled 2011-04-07: qty 3

## 2011-04-07 MED ORDER — PREDNISONE 10 MG PO TABS
10.0000 mg | ORAL_TABLET | Freq: Every day | ORAL | Status: DC
  Administered 2011-04-07 – 2011-04-11 (×5): 10 mg via ORAL
  Filled 2011-04-07 (×5): qty 1

## 2011-04-07 MED ORDER — PREDNISOLONE 5 MG PO TABS
10.0000 mg | ORAL_TABLET | Freq: Every day | ORAL | Status: DC
  Filled 2011-04-07: qty 2

## 2011-04-07 NOTE — Progress Notes (Signed)
NAME:  Sheri Matthews, Sheri Matthews NO.:  [id number]  MEDICAL RECORD NO.:  [id number]  LOCATION:  A301                          FACILITY:  [facility]  PHYSICIAN:  Melvyn Novas, MDDATE OF BIRTH:  April 30, 1924  DATE OF PROCEDURE: DATE OF DISCHARGE:                                PROGRESS NOTE   The patient has recent onset atrial fibrillation, now moderately controlled ventricular response and anemia, was given 2 units of packed cells yesterday, hemoglobin went from 8.2 to 10.7.  Etiology of anemia is undiagnosed at present.  Stools for occult blood have been negative, could be partially dilutional.  We will continue to monitor stools.  Has worsening renal failure.  Creatinine jumped to 2.11, currently on anticoagulation with Coumadin, overly anticoagulated.  INR today is 3.57 on the control pharmacy.  Blood pressure 127/67, temperature 98.5, pulse 64 and irregular, respiratory rate 16, O2 saturation 92%.  Plan right now is to get chest x-ray, PA and lateral today to assess some small infiltrate present 6 days ago before antibiotics.  Continue DuoNeb nebulizer.  Lungs are clear.  No rales, wheeze, or rhonchi appreciable.  Heart irregular rhythm.  No S3 or gallop.  Neck shows no JVD.  The patient alert and oriented.  I had a long talk today with the patient about increasing activity.  We will hold Coumadin as per pharmacy protocol.  Check PT/INR daily and consider transfer to rehabilitative skilled nursing facility soon.     Melvyn Novas, MD     RMD/MEDQ  D:  04/06/2011  T:  04/06/2011  Job:  [id number]

## 2011-04-07 NOTE — Progress Notes (Signed)
[id number] 

## 2011-04-07 NOTE — Consult Note (Signed)
ANTIBIOTIC CONSULT NOTE - INITIAL  Pharmacy Consult for vancomycin and zosyn Indication: rule out pneumonia  No Known Allergies  Patient Measurements: Height: 5' (152.4 cm) Weight: 125 lb 12.8 oz (57.063 kg) IBW/kg (Calculated) : 45.5   Vital Signs: Temp: 98.4 F (36.9 C) (11/23 0651) Temp src: Oral (11/23 0651) BP: 145/61 mmHg (11/23 0651) Pulse Rate: 69  (11/23 0651) Intake/Output from previous day: 11/22 0701 - 11/23 0700 In: -  Out: 1 [Urine:1] Intake/Output from this shift:    Labs:  Basename 04/07/11 0500 04/07/11 0444 04/06/11 0542 04/05/11 0442  WBC -- 11.7* 11.9* 10.8*  HGB -- 10.8* 10.7* 8.2*  PLT -- 396 329 305  LABCREA -- -- -- --  CREATININE 2.08* -- 2.11* 1.76*   Estimated Creatinine Clearance: 15.6 ml/min (by C-G formula based on Cr of 2.08).  Basename 04/05/11 1450  VANCOTROUGH 17.5  VANCOPEAK --  VANCORANDOM --  GENTTROUGH --  GENTPEAK --  GENTRANDOM --  TOBRATROUGH --  TOBRAPEAK --  TOBRARND --  AMIKACINPEAK --  AMIKACINTROU --  AMIKACIN --     Microbiology: No results found for this or any previous visit (from the past 720 hour(s)).  Medical History: Past Medical History  Diagnosis Date  . Hypertension   . Arthritis   . Shortness of breath   . Stroke   . GERD (gastroesophageal reflux disease)   . Dysrhythmia   . Pneumonia   . Depression   . Anemia    Medications:  Scheduled:     . benazepril  5 mg Oral Daily  . calcium-vitamin D  1 tablet Oral Daily  . chlorpheniramine-HYDROcodone  5 mL Oral Q12H  . diltiazem  180 mg Oral Daily  . feeding supplement  1 Container Oral TID BM  . levalbuterol  0.63 mg Nebulization Q8H  . LORazepam  0.5 mg Oral BID  . metoprolol tartrate  100 mg Oral BID  . piperacillin-tazobactam (ZOSYN)  IV  2.25 g Intravenous Q8H  . potassium chloride  20 mEq Oral BID  . predniSONE  10 mg Oral QAC breakfast  . rosuvastatin  10 mg Oral q1800  . sodium chloride      . tuberculin  5 Units Intradermal  Once  . vancomycin  1,000 mg Intravenous Q24H  . DISCONTD: prednisoLONE  10 mg Oral Daily   Assessment: Decreased renal fxn  Vancomycin trough 17.5  Goal of Therapy:  Vancomycin trough level 15-20 mcg/ml  Plan: Continue Zosyn 2.25 gm iv q8hrs  Vancomycin 1gm iv q24hrs Continue to monitor renal function  Sheri Matthews, Sheri Matthews 04/07/2011,9:48 AM

## 2011-04-07 NOTE — Progress Notes (Signed)
Sheri Matthews, Sheri Matthews NO.:  [id number]  MEDICAL RECORD NO.:  [id number]  LOCATION:  A301                          FACILITY:  [facility]  PHYSICIAN:  Melvyn Novas, MDDATE OF BIRTH:  1924/03/13  DATE OF PROCEDURE: DATE OF DISCHARGE:                                PROGRESS NOTE   PROBLEMS: 1. Recent onset atrial fibrillation. 2. Recently begun anticoagulation with Coumadin, currently over     anticoagulated with an INR of 3.7 today. 3. Hypertension. 4. Anemia status post transfusion with 2 units of packed cells, so far     stools were heme negative, etiology undetermined, could be     partially dilutional. 5. Left lower lobe infiltrate, hospital acquired, currently on     vancomycin and Zosyn. 6. Chronic obstructive pulmonary disease. 7. Status post fall with generalized weakness, and the patient not     actively participating in physical therapy. 8. Progressive renal impairment with creatinine jumped to 2.11     yesterday.  PHYSICAL EXAMINATION:  GENERAL:  The patient is alert, oriented. LUNGS:  Showed new wheezing today, not present yesterday. Bilateral coarse rhonchi.  Chest x-ray performed yesterday reveals mildly improved left lower lobe infiltrate, on vancomycin and Zosyn.  HEART: Irregularly irregular. No S3. HEENT:  Conjunctivae are pink.  The patient was scheduled for discharge to the [facility]; however, I will cancel that.  We will continue vanc and Zosyn for 3 or 4 more days. Continue DuoNeb nebulizer.  We will add prednisone taper over 6 days for bronchospastic component.  Continue incentive spirometry and aggressive physical therapy, and we will probably transfer to [facility] on Tuesday.     Melvyn Novas, MD     RMD/MEDQ  D:  04/07/2011  T:  04/07/2011  Job:  [id number]

## 2011-04-07 NOTE — Progress Notes (Signed)
CARE MANAGEMENT NOTE 04/07/2011  Patient:  Sheri Matthews, Sheri Matthews   Account Number:  [id number]  Date Initiated:  03/30/2011  Documentation initiated by:  Andi Devon Assessment:   77 yr old female admitted with fall confusion lives with her daughter . family requesting placement     Action/Plan:   PTA, Pt lived at home. to return home with family   Anticipated DC Date:  04/07/2011   Anticipated DC Plan:  HOME W HOME HEALTH SERVICES  In-house referral  Clinical Social Worker      DC Planning Services  CM consult      Choice offered to / List presented to:             Status of service:  In process, will continue to follow Medicare Important Message given?   (If response is "NO", the following Medicare IM given date fields will be blank) Date Medicare IM given:   Date Additional Medicare IM given:    Discharge Disposition:    Per UR Regulation:    Comments:  04/07/11 1130 Ameena Vesey RN BSN Plans to dc but needs additional antibiotics. Plan to DC to [facility] on Tues.  04/04/2011 Mendel Corning rn bsn Contacted by PT Debarah Crape who is now recommending snf for short term rehab. Spoke to pts daughter Rivka Barbara who states family in agreement. Referral to sw. pt with temp 102 last evening on iv antxs. 04/03/11 1645 Shalay Carder RN BSN Family and patient have decided that patient will DC home w/ family. CM to follow with [facility] needs.  03/30/2011 Mendel Corning rn bsn spoke to pts daughter glenda concerning d/c plans per glenda family is requesting placement. referral to sw

## 2011-04-07 NOTE — Progress Notes (Signed)
ANTICOAGULATION CONSULT NOTE - follow up  Pharmacy Consult for Coumadin Indication: atrial fibrillation  No Known Allergies  Patient Measurements: Height: 5' (152.4 cm) Weight: 125 lb 12.8 oz (57.063 kg) IBW/kg (Calculated) : 45.5   Vital Signs: Temp: 98.4 F (36.9 C) (11/23 0651) Temp src: Oral (11/23 0651) BP: 145/61 mmHg (11/23 0651) Pulse Rate: 69  (11/23 0651)  Labs:  Basename 04/07/11 0444 04/06/11 0542 04/05/11 0442  HGB 10.8* 10.7* --  HCT 33.0* 32.1* 26.1*  PLT 396 329 305  APTT -- -- --  LABPROT 38.8* 36.2* 30.2*  INR 3.90* 3.57* 2.83*  HEPARINUNFRC -- -- --  CREATININE -- 2.11* 1.76*  CKTOTAL -- -- --  CKMB -- -- --  TROPONINI -- -- --   Estimated Creatinine Clearance: 15.4 ml/min (by C-G formula based on Cr of 2.11).  Medical History: Past Medical History  Diagnosis Date  . Hypertension   . Arthritis   . Shortness of breath   . Stroke   . GERD (gastroesophageal reflux disease)   . Dysrhythmia   . Pneumonia   . Depression   . Anemia    Medications:  Scheduled:     . benazepril  5 mg Oral Daily  . calcium-vitamin D  1 tablet Oral Daily  . chlorpheniramine-HYDROcodone  5 mL Oral Q12H  . diltiazem  180 mg Oral Daily  . feeding supplement  1 Container Oral TID BM  . levalbuterol  0.63 mg Nebulization Q8H  . LORazepam  0.5 mg Oral BID  . metoprolol tartrate  100 mg Oral BID  . piperacillin-tazobactam (ZOSYN)  IV  2.25 g Intravenous Q8H  . potassium chloride  20 mEq Oral BID  . prednisoLONE  10 mg Oral Daily  . rosuvastatin  10 mg Oral q1800  . tuberculin  5 Units Intradermal Once  . vancomycin  1,000 mg Intravenous Q24H   Assessment: INR still elevated 3.9  Goal of Therapy:  INR 2-3   Plan:  No Coumadin today (2nd day of no Coumadin) INR daily Monitor CBC   Allyna Pittsley Bennett 04/07/2011,8:17 AM   Adjusted Zosyn dose for decreased renal function. Josephine Igo 04/07/2011 8:17 AM

## 2011-04-08 LAB — CBC
HCT: 34.9 % — ABNORMAL LOW (ref 36.0–46.0)
Hemoglobin: 11.4 g/dL — ABNORMAL LOW (ref 12.0–15.0)
MCV: 87.3 fL (ref 78.0–100.0)
Platelets: 476 10*3/uL — ABNORMAL HIGH (ref 150–400)
RBC: 4 MIL/uL (ref 3.87–5.11)
WBC: 9.8 10*3/uL (ref 4.0–10.5)

## 2011-04-08 NOTE — Progress Notes (Signed)
ANTICOAGULATION CONSULT NOTE - follow up  Pharmacy Consult for Coumadin Indication: atrial fibrillation  No Known Allergies  Patient Measurements: Height: 5' (152.4 cm) Weight: 125 lb 12.8 oz (57.063 kg) IBW/kg (Calculated) : 45.5   Vital Signs: Temp: 98 F (36.7 C) (11/24 0547) Temp src: Oral (11/24 0547) BP: 168/72 mmHg (11/24 0547) Pulse Rate: 60  (11/24 0547)  Labs:  Basename 04/08/11 0454 04/07/11 0500 04/07/11 0444 04/06/11 0542  HGB 11.4* -- 10.8* --  HCT 34.9* -- 33.0* 32.1*  PLT 476* -- 396 329  APTT -- -- -- --  LABPROT 45.5* -- 38.8* 36.2*  INR 4.78* -- 3.90* 3.57*  HEPARINUNFRC -- -- -- --  CREATININE -- 2.08* -- 2.11*  CKTOTAL -- -- -- --  CKMB -- -- -- --  TROPONINI -- -- -- --   Estimated Creatinine Clearance: 15.6 ml/min (by C-G formula based on Cr of 2.08).  Medical History: Past Medical History  Diagnosis Date  . Hypertension   . Arthritis   . Shortness of breath   . Stroke   . GERD (gastroesophageal reflux disease)   . Dysrhythmia   . Pneumonia   . Depression   . Anemia    Medications:  Scheduled:     . benazepril  5 mg Oral Daily  . calcium-vitamin D  1 tablet Oral Daily  . chlorpheniramine-HYDROcodone  5 mL Oral Q12H  . diltiazem  180 mg Oral Daily  . feeding supplement  1 Container Oral TID BM  . levalbuterol  0.63 mg Nebulization Q8H  . LORazepam  0.5 mg Oral BID  . metoprolol tartrate  100 mg Oral BID  . piperacillin-tazobactam (ZOSYN)  IV  2.25 g Intravenous Q8H  . predniSONE  10 mg Oral QAC breakfast  . rosuvastatin  10 mg Oral q1800  . sodium chloride      . sodium chloride      . sodium chloride      . tuberculin  5 Units Intradermal Once  . vancomycin  1,000 mg Intravenous Q24H  . DISCONTD: prednisoLONE  10 mg Oral Daily   Assessment: INR still elevated 4.78, still trending upward No Coumadin for last two days, so expect INR to level off and start decreasing  Goal of Therapy:  INR 2-3   Plan:  No Coumadin  today (3rd day of no Coumadin) INR daily Monitor CBC   Adylin Hankey Bennett 04/08/2011,7:46 AM   Adjusted Zosyn dose for decreased renal function. Josephine Igo 04/08/2011 7:46 AM

## 2011-04-08 NOTE — Progress Notes (Signed)
04/08/11 0809 Patient has elevated INR of 4.78 this am, Dr Renard Matter notified. Stated continue to hold coumadin and continue daily PT/INR checks. Nursing to monitor.

## 2011-04-08 NOTE — Progress Notes (Signed)
Sheri Matthews, PACIFICO                ACCOUNT NO.:  [id number]  MEDICAL RECORD NO.:  [id number]  LOCATION:  A301                          FACILITY:  [facility]  PHYSICIAN:  Bronda Alfred G. Renard Matter, MD   DATE OF BIRTH:  24-Jul-1923  DATE OF PROCEDURE: DATE OF DISCHARGE:                                PROGRESS NOTE   This patient has the following problems:  Atrial fibrillation, hypertension, anemia of undetermined etiology, left lower lobe infiltrate hospital acquired, COPD, progressive renal impairment.  Pertinent labs in the last 72 hours, INR 4.78.  CBC:  WBC 9800 with hemoglobin 11.4 and hematocrit 34.9.  Chemistries within normal range.  OBJECTIVE:  VITAL SIGNS:  Blood pressure 168/72, respirations 20, pulse 60, and temp 98.  Creatinine 2.08. LUNGS:  Clear to P and A. HEART:  Irregular rhythm. ABDOMEN:  No palpable organs or masses.  To hold Coumadin.  Continue to monitor prothrombin times.     [name], MD     AGM/MEDQ  D:  04/08/2011  T:  04/08/2011  Job:  [id number]

## 2011-04-09 LAB — DIFFERENTIAL
Basophils Relative: 0 % (ref 0–1)
Monocytes Relative: 11 % (ref 3–12)
Neutro Abs: 7.8 10*3/uL — ABNORMAL HIGH (ref 1.7–7.7)
Neutrophils Relative %: 73 % (ref 43–77)

## 2011-04-09 LAB — CBC
HCT: 32.9 % — ABNORMAL LOW (ref 36.0–46.0)
Hemoglobin: 10.9 g/dL — ABNORMAL LOW (ref 12.0–15.0)
MCV: 87.5 fL (ref 78.0–100.0)
Platelets: 486 10*3/uL — ABNORMAL HIGH (ref 150–400)
RBC: 3.76 MIL/uL — ABNORMAL LOW (ref 3.87–5.11)
WBC: 10.5 10*3/uL (ref 4.0–10.5)

## 2011-04-09 LAB — BASIC METABOLIC PANEL
BUN: 11 mg/dL (ref 6–23)
CO2: 24 mEq/L (ref 19–32)
Chloride: 107 mEq/L (ref 96–112)
Chloride: 105 mEq/L (ref 96–112)
Creatinine, Ser: 1.66 mg/dL — ABNORMAL HIGH (ref 0.50–1.10)
GFR calc Af Amer: 31 mL/min — ABNORMAL LOW (ref >90)
Potassium: 3.2 mEq/L — ABNORMAL LOW (ref 3.5–5.1)
Sodium: 137 mEq/L (ref 135–145)

## 2011-04-09 MED ORDER — SODIUM CHLORIDE 0.9 % IJ SOLN
INTRAMUSCULAR | Status: AC
  Administered 2011-04-09: 10 mL
  Filled 2011-04-09: qty 3

## 2011-04-09 MED ORDER — POTASSIUM CHLORIDE 10 MEQ/100ML IV SOLN
10.0000 meq | INTRAVENOUS | Status: AC
  Administered 2011-04-09 (×3): 10 meq via INTRAVENOUS
  Filled 2011-04-09 (×3): qty 100

## 2011-04-09 NOTE — Progress Notes (Signed)
Sheri Matthews, Sheri Matthews                ACCOUNT NO.:  [id number]  MEDICAL RECORD NO.:  [id number]  LOCATION:  A301                          FACILITY:  [facility]  PHYSICIAN:  Myrtice Lowdermilk G. Renard Matter, MD   DATE OF BIRTH:  1924/05/09  DATE OF PROCEDURE: DATE OF DISCHARGE:                                PROGRESS NOTE   This patient had a relatively calm night, still coughing some on occasions.  She does have atrial fibrillation, hypertension, anemia of undetermined etiology and left lower lobe infiltrate, hospital acquired COPD, progressive renal impairment.  OBJECTIVE:  VITAL SIGNS:  Blood pressure 155/64, respirations 20, pulse 56 and temp 97.3.  Recent potassium was 3.2. LUNGS:  Clear to P and A. HEART:  Irregular rhythm. ABDOMEN:  No palpable organs or masses.  ASSESSMENT:  The patient was admitted with above-stated problems.  She does have low serum potassium today.  PLAN:  To replete potassium with runs of KCl.  Continue her other medications as prescribed.     Shinita Mac G. Renard Matter, MD     AGM/MEDQ  D:  04/09/2011  T:  04/09/2011  Job:  [id number]

## 2011-04-09 NOTE — Progress Notes (Signed)
04/09/11 1035 patient has BP of 173/77 and HR in 50s this am. Ordered to receive metoprolol 100 mg po and diltiazem 180 mg po this am with morning meds. Notified Dr Renard Matter of BP and HR, stated okay to give meds as ordered. Nursing to monitor.

## 2011-04-09 NOTE — Progress Notes (Signed)
ANTICOAGULATION CONSULT NOTE - follow up  Pharmacy Consult for Coumadin Indication: atrial fibrillation  No Known Allergies  Patient Measurements: Height: 5' (152.4 cm) Weight: 125 lb 12.8 oz (57.063 kg) IBW/kg (Calculated) : 45.5   Vital Signs: Temp: 97.3 F (36.3 C) (11/25 0553) Temp src: Oral (11/25 0553) BP: 155/64 mmHg (11/25 0553) Pulse Rate: 56  (11/25 0553)  Labs:  Basename 04/09/11 0608 04/09/11 0602 04/08/11 0454 04/07/11 0500 04/07/11 0444  HGB -- 10.9* 11.4* -- --  HCT -- 32.9* 34.9* -- 33.0*  PLT -- 486* 476* -- 396  APTT -- -- -- -- --  LABPROT -- 38.4* 45.5* -- 38.8*  INR -- 3.85* 4.78* -- 3.90*  HEPARINUNFRC -- -- -- -- --  CREATININE 1.69* -- -- 2.08* --  CKTOTAL -- -- -- -- --  CKMB -- -- -- -- --  TROPONINI -- -- -- -- --   Estimated Creatinine Clearance: 19.2 ml/min (by C-G formula based on Cr of 1.69).  Medical History: Past Medical History  Diagnosis Date  . Hypertension   . Arthritis   . Shortness of breath   . Stroke   . GERD (gastroesophageal reflux disease)   . Dysrhythmia   . Pneumonia   . Depression   . Anemia    Medications:  Scheduled:     . benazepril  5 mg Oral Daily  . calcium-vitamin D  1 tablet Oral Daily  . chlorpheniramine-HYDROcodone  5 mL Oral Q12H  . diltiazem  180 mg Oral Daily  . feeding supplement  1 Container Oral TID BM  . levalbuterol  0.63 mg Nebulization Q8H  . LORazepam  0.5 mg Oral BID  . metoprolol tartrate  100 mg Oral BID  . piperacillin-tazobactam (ZOSYN)  IV  2.25 g Intravenous Q8H  . potassium chloride  10 mEq Intravenous Q1 Hr x 3  . predniSONE  10 mg Oral QAC breakfast  . rosuvastatin  10 mg Oral q1800  . tuberculin  5 Units Intradermal Once  . vancomycin  1,000 mg Intravenous Q24H   Assessment: INR still elevated 3.85, now trending downward No Coumadin for last three days, so expected INR to level off and start decreasing  Goal of Therapy:  INR 2-3   Plan:  No Coumadin today (4th day  of no Coumadin) INR daily Monitor CBC   Anhthu Perdew Bennett 04/09/2011,9:30 AM   Adjusted Zosyn dose for decreased renal function. Josephine Igo 04/09/2011 9:30 AM

## 2011-04-09 NOTE — Progress Notes (Signed)
04/09/11 0739 patient has K+ level of 3.2 this am, notified Dr Renard Matter. Orders received and placed for KCL 10 mEq IV x 3 runs and repeat BMET afterward. Nursing to monitor.

## 2011-04-10 LAB — CBC
HCT: 33.8 % — ABNORMAL LOW (ref 36.0–46.0)
Hemoglobin: 10.9 g/dL — ABNORMAL LOW (ref 12.0–15.0)
MCHC: 32.2 g/dL (ref 30.0–36.0)
MCV: 87.8 fL (ref 78.0–100.0)
Platelets: 530 10*3/uL — ABNORMAL HIGH (ref 150–400)
RBC: 3.85 MIL/uL — ABNORMAL LOW (ref 3.87–5.11)
RDW: 14.5 % (ref 11.5–15.5)
WBC: 10.5 10*3/uL (ref 4.0–10.5)

## 2011-04-10 LAB — PROTIME-INR
INR: 3.58 — ABNORMAL HIGH (ref 0.00–1.49)
Prothrombin Time: 36.3 seconds — ABNORMAL HIGH (ref 11.6–15.2)

## 2011-04-10 MED ORDER — METOPROLOL TARTRATE 50 MG PO TABS
ORAL_TABLET | ORAL | Status: AC
  Filled 2011-04-10: qty 2

## 2011-04-10 MED ORDER — VANCOMYCIN HCL IN DEXTROSE 1-5 GM/200ML-% IV SOLN
1000.0000 mg | INTRAVENOUS | Status: DC
  Filled 2011-04-10: qty 200

## 2011-04-10 MED ORDER — ROSUVASTATIN CALCIUM 5 MG PO TABS
ORAL_TABLET | ORAL | Status: AC
  Administered 2011-04-10: 19:00:00
  Filled 2011-04-10: qty 2

## 2011-04-10 MED ORDER — HYDROCODONE-ACETAMINOPHEN 5-325 MG PO TABS
ORAL_TABLET | ORAL | Status: AC
  Administered 2011-04-10: 19:00:00
  Filled 2011-04-10: qty 1

## 2011-04-10 MED ORDER — CLONIDINE HCL 0.1 MG PO TABS: 0.1000 mg | ORAL_TABLET | Freq: Four times a day (QID) | ORAL | Status: DC | PRN

## 2011-04-10 MED ORDER — LEVALBUTEROL HCL 0.63 MG/3ML IN NEBU
INHALATION_SOLUTION | RESPIRATORY_TRACT | Status: AC
  Filled 2011-04-10: qty 3

## 2011-04-10 MED ORDER — LORAZEPAM 0.5 MG PO TABS
ORAL_TABLET | ORAL | Status: AC
  Filled 2011-04-10: qty 1

## 2011-04-10 MED ORDER — CLONIDINE HCL ER 0.1 MG PO TB12: 0.1000 mg | ORAL_TABLET | Freq: Four times a day (QID) | ORAL | Status: DC | PRN

## 2011-04-10 MED ORDER — HYDROCOD POLST-CHLORPHEN POLST 10-8 MG/5ML PO LQCR
ORAL | Status: AC
  Filled 2011-04-10: qty 5

## 2011-04-10 MED ORDER — ENSURE CLINICAL ST REVIGOR PO LIQD
237.0000 mL | Freq: Three times a day (TID) | ORAL | Status: DC
  Administered 2011-04-10 – 2011-04-11 (×3): 237 mL via ORAL

## 2011-04-10 MED ORDER — TRAMADOL HCL 50 MG PO TABS
ORAL_TABLET | ORAL | Status: AC
  Filled 2011-04-10: qty 1

## 2011-04-10 NOTE — Progress Notes (Signed)
[facility], [facility]                ACCOUNT NO.:  [id number]  MEDICAL RECORD NO.:  [id number]  LOCATION:  A301                          FACILITY:  [facility]  PHYSICIAN:  Mila Homer. Sudie Bailey, M.D.DATE OF BIRTH:  08-01-1923  DATE OF PROCEDURE: DATE OF DISCHARGE:                                PROGRESS NOTE   SUBJECTIVE:  She feels about the same today.  OBJECTIVE:  GENERAL:  She is supine in bed, no acute distress, well- developed, well-nourished elderly woman who looks somewhat frail. VITAL SIGNS:  Temp is 98.2, pulse 54, respiratory rate 20, blood pressure 187/77.  BP earlier was 166/76. LUNGS:  Fairly clear throughout.  Occasional rhonchi posteriorly. HEART:  Regular rhythm and rate of about 60. EXTREMITIES:  There is no edema of the ankles. ABDOMEN:  Soft without organomegaly or mass or tenderness today.  ASSESSMENT: 1. Pneumonia. 2. Chronic atrial fibrillation. 3. Benign essential hypertension. 4. Chronic obstructive pulmonary disease. 5. Renal impairment. 6. Warfarin anticoagulation. 7. Hypokalemia, resolved. I reviewed her recent labs.  Her potassium is now up to 4.0 but was 3.2 yesterday.  This was accomplished with runs of potassium.  Hemoglobin stable at 10.9.  INR is still elevated at 3.58.  PLAN:  Continue with the Zosyn and vancomycin IV.  She will be seen by her LMD, Dr. Janna Arch, tomorrow on rounds.     Mila Homer. Sudie Bailey, M.D.     SDK/MEDQ  D:  04/10/2011  T:  04/10/2011  Job:  [id number]

## 2011-04-10 NOTE — Progress Notes (Signed)
Physical Therapy Treatment Patient Details Name: TIERANY APPLEBY MRN: [id number] DOB: 03-Mar-1924 Today's Date: 04/10/2011  TIME: 908-926/ TE-70mins GT  PT Assessment/Plan  PT - Assessment/Plan Comments on Treatment Session: Pt refused PT at first stating, "I get up and use the bathroom that is my exercise, I don't need PT" after educating patient on importance of participating with PT she agreed to do bed exercises and ambulate to the bathroom; pt did not want to use AD and was supervision amb to bathroom with gait belt and using  IV;pt refused to sit up in recliner until Dr. Janna Arch did his rounds PT Goals  Acute Rehab PT Goals PT Goal: Supine/Side to Sit - Progress: Met PT Goal: Sit to Supine/Side - Progress: Met PT Transfer Goal: Sit to Stand/Stand to Sit - Progress: Met PT Transfer Goal: Bed to Chair/Chair to Bed - Progress: Met PT Goal: Ambulate - Progress: Progressing toward goal  PT Treatment Precautions/Restrictions  Precautions Precautions: Fall Required Braces or Orthoses: No Restrictions Weight Bearing Restrictions: No Mobility (including Balance) Bed Mobility Bed Mobility: Yes Supine to Sit: 6: Modified independent (Device/Increase time) Transfers Transfers: Yes Sit to Stand: 6: Modified independent (Device/Increase time) Stand to Sit: 6: Modified independent (Device/Increase time) Ambulation/Gait Ambulation/Gait: Yes Ambulation/Gait Assistance: 5: Supervision Ambulation/Gait Assistance Details (indicate cue type and reason): No assistive device used Ambulation Distance (Feet): 16 Feet Assistive device: None Gait Pattern: Trunk flexed;Antalgic Gait velocity: very slow    Exercise  General Exercises - Lower Extremity Ankle Circles/Pumps: 10 reps;Both Quad Sets: Both;10 reps Gluteal Sets: Both;10 reps Heel Slides: Both;10 reps Hip ABduction/ADduction: Both;10 reps Straight Leg Raises: Both;10 reps End of Session PT - End of Session Equipment  Utilized During Treatment: Gait belt Activity Tolerance: Patient limited by pain;Patient limited by fatigue Patient left: in bed;with call bell in reach;with bed alarm set General Behavior During Session: [facility] for tasks performed Cognition: [facility] for tasks performed  Joby Richart ATKINSO 04/10/2011, 9:40 AM

## 2011-04-10 NOTE — Progress Notes (Signed)
CSW is following this Pt who comes from home, but will need SNF at D/C.  Anticipated D/C is for 11/27.  Plan continues to be for Patient to D/C to the [facility].  CSW will continue to follow and assist with D/C as needed.

## 2011-04-10 NOTE — Consult Note (Signed)
ANTIBIOTIC CONSULT NOTE - INITIAL  Pharmacy Consult for vancomycin and zosyn Indication: rule out pneumonia  No Known Allergies  Patient Measurements: Height: 5' (152.4 cm) Weight: 125 lb 12.8 oz (57.063 kg) IBW/kg (Calculated) : 45.5   Vital Signs: Temp: 98.1 F (36.7 C) (11/26 1400) Temp src: Oral (11/26 1400) BP: 179/76 mmHg (11/26 1400) Pulse Rate: 57  (11/26 1400) Intake/Output from previous day: 11/25 0701 - 11/26 0700 In: 2923.8 [P.O.:480; I.V.:1793.8; IV Piggyback:650] Out: 1301 [Urine:1301] Intake/Output from this shift: Total I/O In: 808.8 [P.O.:440; I.V.:368.8] Out: -   Labs:  Basename 04/10/11 0402 04/09/11 1212 04/09/11 0608 04/09/11 0602 04/08/11 0454  WBC 10.5 -- -- 10.5 9.8  HGB 10.9* -- -- 10.9* 11.4*  PLT 530* -- -- 486* 476*  LABCREA -- -- -- -- --  CREATININE -- 1.66* 1.69* -- --   Estimated Creatinine Clearance: 19.6 ml/min (by C-G formula based on Cr of 1.66).  Basename 04/10/11 1612  VANCOTROUGH 33.5*  VANCOPEAK --  Drue Dun --  GENTTROUGH --  GENTPEAK --  GENTRANDOM --  TOBRATROUGH --  TOBRAPEAK --  TOBRARND --  AMIKACINPEAK --  AMIKACINTROU --  AMIKACIN --     Microbiology: No results found for this or any previous visit (from the past 720 hour(s)).  Medical History: Past Medical History  Diagnosis Date  . Hypertension   . Arthritis   . Shortness of breath   . Stroke   . GERD (gastroesophageal reflux disease)   . Dysrhythmia   . Pneumonia   . Depression   . Anemia    Medications:  Scheduled:     . benazepril  5 mg Oral Daily  . calcium-vitamin D  1 tablet Oral Daily  . chlorpheniramine-HYDROcodone  5 mL Oral Q12H  . diltiazem  180 mg Oral Daily  . feeding supplement  237 mL Oral TID WC  . levalbuterol  0.63 mg Nebulization Q8H  . LORazepam  0.5 mg Oral BID  . metoprolol tartrate  100 mg Oral BID  . piperacillin-tazobactam (ZOSYN)  IV  2.25 g Intravenous Q8H  . predniSONE  10 mg Oral QAC breakfast  .  rosuvastatin      . rosuvastatin  10 mg Oral q1800  . tuberculin  5 Units Intradermal Once  . vancomycin  1,000 mg Intravenous Q48H  . DISCONTD: vancomycin  1,000 mg Intravenous Q24H   Assessment: Decreased renal fxn  Vancomycin trough high.  Goal of Therapy:  Vancomycin trough level 15-20 mcg/ml  Plan: Continue Zosyn 2.25 gm iv q8hrs  Vancomycin 1gm iv every 48 hours. Continue to monitor renal function  Tomi Bamberger J 04/10/2011,6:37 PM

## 2011-04-10 NOTE — Progress Notes (Signed)
ANTICOAGULATION CONSULT NOTE -  Pharmacy Consult for Coumadin Indication: atrial fibrillation  No Known Allergies  Patient Measurements: Height: 5' (152.4 cm) Weight: 125 lb 12.8 oz (57.063 kg) IBW/kg (Calculated) : 45.5   Vital Signs: Temp: 98.2 F (36.8 C) (11/26 0600) Temp src: Oral (11/26 0600) BP: 187/77 mmHg (11/26 0600) Pulse Rate: 54  (11/26 0600)  Labs:  Basename 04/10/11 0402 04/09/11 1212 04/09/11 0608 04/09/11 0602 04/08/11 0454  HGB 10.9* -- -- 10.9* --  HCT 33.8* -- -- 32.9* 34.9*  PLT 530* -- -- 486* 476*  APTT -- -- -- -- --  LABPROT 36.3* -- -- 38.4* 45.5*  INR 3.58* -- -- 3.85* 4.78*  HEPARINUNFRC -- -- -- -- --  CREATININE -- 1.66* 1.69* -- --  CKTOTAL -- -- -- -- --  CKMB -- -- -- -- --  TROPONINI -- -- -- -- --   Estimated Creatinine Clearance: 19.6 ml/min (by C-G formula based on Cr of 1.66).  Medical History: Past Medical History  Diagnosis Date  . Hypertension   . Arthritis   . Shortness of breath   . Stroke   . GERD (gastroesophageal reflux disease)   . Dysrhythmia   . Pneumonia   . Depression   . Anemia    Medications:  Scheduled:     . benazepril  5 mg Oral Daily  . calcium-vitamin D  1 tablet Oral Daily  . chlorpheniramine-HYDROcodone  5 mL Oral Q12H  . diltiazem  180 mg Oral Daily  . levalbuterol  0.63 mg Nebulization Q8H  . LORazepam  0.5 mg Oral BID  . metoprolol tartrate  100 mg Oral BID  . piperacillin-tazobactam (ZOSYN)  IV  2.25 g Intravenous Q8H  . potassium chloride  10 mEq Intravenous Q1 Hr x 3  . predniSONE  10 mg Oral QAC breakfast  . rosuvastatin  10 mg Oral q1800  . sodium chloride      . tuberculin  5 Units Intradermal Once  . vancomycin  1,000 mg Intravenous Q24H  . DISCONTD: feeding supplement  1 Container Oral TID BM   Assessment: INR still elevated 3.58, now trending downward No Coumadin for last four days, so expected INR to level off and continue to decrease.  Goal of Therapy:  INR 2-3   Plan:   No Coumadin today (5th day of no Coumadin) INR daily Monitor CBC   Minha Fulco J 04/10/2011,8:38 AM   Adjusted Zosyn dose for decreased renal function. Caryl Asp 04/10/2011 8:38 AM

## 2011-04-10 NOTE — Plan of Care (Signed)
Problem: Inadequate Intake (NI-2.1) Goal: Food and/or nutrient delivery Individualized approach for food/nutrient provision.  Outcome: Not Progressing Inadequate oral intake cont. Pt reports poor po and appetite <50% of meals consumed. Talked with daughter Lupita Leash by telephone who confirms poor intake (taking bites only of meals) which started prior to pt fall. Pt says she understands she needs the nutrition but just is not able to make herself eat. She had been receiving [employer] with poor acceptance. Will change to [employer] with all meals. D/C to [facility] possible tomorrow. Rec cont with oral suppl as part of d/c orders given pt malnourished state.   Dietitian-#[phone]

## 2011-04-11 ENCOUNTER — Inpatient Hospital Stay
Admission: EM | Admit: 2011-04-11 | Discharge: 2011-04-17 | Disposition: A | Discharge status: Home / Self Care | Payer: Medicare Other | Source: Ambulatory Visit | Attending: Internal Medicine | Admitting: Internal Medicine

## 2011-04-11 LAB — BASIC METABOLIC PANEL
BUN: 12 mg/dL (ref 6–23)
Creatinine, Ser: 1.67 mg/dL — ABNORMAL HIGH (ref 0.50–1.10)
GFR calc non Af Amer: 27 mL/min — ABNORMAL LOW (ref >90)
Glucose, Bld: 81 mg/dL (ref 70–99)
Potassium: 3.1 mEq/L — ABNORMAL LOW (ref 3.5–5.1)

## 2011-04-11 LAB — DIFFERENTIAL
Basophils Absolute: 0 10*3/uL (ref 0.0–0.1)
Eosinophils Absolute: 0.1 10*3/uL (ref 0.0–0.7)
Eosinophils Relative: 1 % (ref 0–5)
Lymphocytes Relative: 15 % (ref 12–46)
Monocytes Absolute: 1.2 10*3/uL — ABNORMAL HIGH (ref 0.1–1.0)

## 2011-04-11 LAB — CBC
MCH: 28.6 pg (ref 26.0–34.0)
MCHC: 32.6 g/dL (ref 30.0–36.0)
MCV: 87.7 fL (ref 78.0–100.0)
Platelets: 545 10*3/uL — ABNORMAL HIGH (ref 150–400)
RDW: 14.4 % (ref 11.5–15.5)

## 2011-04-11 LAB — PROTIME-INR: Prothrombin Time: 32.1 seconds — ABNORMAL HIGH (ref 11.6–15.2)

## 2011-04-11 MED ORDER — CALCIUM CARBONATE-VITAMIN D 500-200 MG-UNIT PO TABS: 1.0000 | ORAL_TABLET | Freq: Every day | ORAL | Status: DC

## 2011-04-11 MED ORDER — DILTIAZEM HCL ER COATED BEADS 180 MG PO CP24: 180.0000 mg | ORAL_CAPSULE | Freq: Every day | ORAL | Status: DC

## 2011-04-11 MED ORDER — ROSUVASTATIN CALCIUM 10 MG PO TABS: 10.0000 mg | ORAL_TABLET | Freq: Every day | ORAL | Status: DC

## 2011-04-11 MED ORDER — WARFARIN SODIUM 2.5 MG PO TABS: 2.5000 mg | ORAL_TABLET | Freq: Once | ORAL | Status: DC

## 2011-04-11 MED ORDER — ACETAMINOPHEN 325 MG PO TABS
ORAL_TABLET | ORAL | Status: AC
  Filled 2011-04-11: qty 2

## 2011-04-11 MED ORDER — POTASSIUM CHLORIDE ER 10 MEQ PO TBCR: 20.0000 meq | EXTENDED_RELEASE_TABLET | Freq: Two times a day (BID) | ORAL | Status: DC

## 2011-04-11 MED ORDER — HYDROCODONE-ACETAMINOPHEN 5-325 MG PO TABS: 1.0000 | ORAL_TABLET | Freq: Four times a day (QID) | ORAL | Status: AC | PRN

## 2011-04-11 MED ORDER — WARFARIN SODIUM 1 MG PO TABS: 1.0000 mg | ORAL_TABLET | Freq: Once | ORAL | Status: DC

## 2011-04-11 MED ORDER — ALBUTEROL SULFATE (5 MG/ML) 0.5% IN NEBU: 2.5000 mg | INHALATION_SOLUTION | RESPIRATORY_TRACT | Status: DC | PRN

## 2011-04-11 MED ORDER — LEVALBUTEROL HCL 0.63 MG/3ML IN NEBU
INHALATION_SOLUTION | RESPIRATORY_TRACT | Status: AC
  Filled 2011-04-11: qty 3

## 2011-04-11 MED ORDER — BENAZEPRIL HCL 5 MG PO TABS: 5.0000 mg | ORAL_TABLET | Freq: Every day | ORAL | Status: DC

## 2011-04-11 NOTE — Progress Notes (Signed)
ANTICOAGULATION CONSULT NOTE -  Pharmacy Consult for Coumadin Indication: atrial fibrillation  No Known Allergies  Patient Measurements: Height: 5' (152.4 cm) Weight: 125 lb 12.8 oz (57.063 kg) IBW/kg (Calculated) : 45.5   Vital Signs: Temp: 98 F (36.7 C) (11/27 0550) Temp src: Oral (11/27 0550) BP: 162/74 mmHg (11/27 0604) Pulse Rate: 49  (11/27 0550)  Labs:  Basename 04/11/11 0443 04/10/11 0402 04/09/11 1212 04/09/11 0608 04/09/11 0602  HGB 11.4* 10.9* -- -- --  HCT 35.0* 33.8* -- -- 32.9*  PLT 545* 530* -- -- 486*  APTT -- -- -- -- --  LABPROT 32.1* 36.3* -- -- 38.4*  INR 3.06* 3.58* -- -- 3.85*  HEPARINUNFRC -- -- -- -- --  CREATININE 1.67* -- 1.66* 1.69* --  CKTOTAL -- -- -- -- --  CKMB -- -- -- -- --  TROPONINI -- -- -- -- --   Estimated Creatinine Clearance: 19.5 ml/min (by C-G formula based on Cr of 1.67).  Medical History: Past Medical History  Diagnosis Date  . Hypertension   . Arthritis   . Shortness of breath   . Stroke   . GERD (gastroesophageal reflux disease)   . Dysrhythmia   . Pneumonia   . Depression   . Anemia    Medications:  Scheduled:     . acetaminophen      . benazepril  5 mg Oral Daily  . calcium-vitamin D  1 tablet Oral Daily  . chlorpheniramine-HYDROcodone  5 mL Oral Q12H  . chlorpheniramine-HYDROcodone      . diltiazem  180 mg Oral Daily  . feeding supplement  237 mL Oral TID WC  . HYDROcodone-acetaminophen      . levalbuterol      . levalbuterol  0.63 mg Nebulization Q8H  . LORazepam      . LORazepam  0.5 mg Oral BID  . metoprolol      . metoprolol tartrate  100 mg Oral BID  . piperacillin-tazobactam (ZOSYN)  IV  2.25 g Intravenous Q8H  . predniSONE  10 mg Oral QAC breakfast  . rosuvastatin      . rosuvastatin  10 mg Oral q1800  . traMADol      . tuberculin  5 Units Intradermal Once  . vancomycin  1,000 mg Intravenous Q48H  . DISCONTD: vancomycin  1,000 mg Intravenous Q24H   Assessment: INR still elevated  (barely above target) but trending downward No Coumadin for last 5 days  Goal of Therapy:  INR 2-3   Plan:  Coumadin 1mg  today INR daily Monitor CBC   Niurka Benecke A 04/11/2011,8:29 AM   Adjusted Zosyn dose for decreased renal function. Valrie Hart A 04/11/2011 8:29 AM

## 2011-04-11 NOTE — Progress Notes (Addendum)
Pt d/c today by MD to [facility]. Pt, family, and facility aware and agreeable.  D/C summary provided by Medical Records and faxed to facility. Pt will transfer with RN.  Admission paperwork already completed at [facility].   [name]

## 2011-04-11 NOTE — Discharge Summary (Signed)
[id number] 

## 2011-04-11 NOTE — Discharge Summary (Signed)
NAME:  Sheri Matthews, Sheri Matthews NO.:  [id number]  MEDICAL RECORD NO.:  [id number]  LOCATION:  A301                          FACILITY:  [facility]  PHYSICIAN:  Melvyn Novas, MDDATE OF BIRTH:  [birth year]  DATE OF ADMISSION:  03/28/2011 DATE OF DISCHARGE:  LH                         DISCHARGE SUMMARY-REFERRING   HISTORY AND HOSPITAL COURSE:  The patient has a history of hypertension, hyperlipidemia, questionable mild dementia who felt weak and fell on the day of admission, was seen and was found to be in atrial fibrillation, presumably new in onset with rapid ventricular response.  She was placed on diltiazem orally and intravenously initially and then with the addition of Lopressor had adequate control of ventricular response.  Her 2-D echo in the hospital showed normal systolic LV function and in fact was somewhat hyperdynamic with ejection fraction of 70-75% with a moderately dilated LA, who subsequently anticoagulated on Lovenox, Foley as well as Coumadin switched over.  She had a mild anemia and stools were negative throughout her hospital stay.  She was hemodynamically stable.  She subsequently developed a hospital-acquired left lower lobe infiltrate which was treated for another additional 9 days with aggressive DuoNeb nebulizer therapy and vancomycin and Zosyn with clinical resolution.  At the time of discharge, she had no cough, sputum, or hemoptysis.  No fever, rigors, or chills.  Her white count had defervesced.  The patient's labs were within normal limits.  Renal function was 1.3 creatinine.  Potassium was mildly low and supplemented with 20 mEq KCl p.o. daily.  DISCHARGE MEDICATIONS: 1. Proventil 5 mg/mL 0.5% nebulizer q.4h while awake. 2. Lotensin 5 mg p.o. daily. 3. Os-Cal D 500/200 p.o. daily. 4. Diltiazem 180 mg p.o. daily. 5. Hydrocodone 5/325 p.o. q.6h p.r.n. 6. Crestor 10 mg p.o. daily. 7. Coumadin 2.5 mg p.o. daily. 8. Amlodipine 5 mg p.o.  daily. 9. Aricept 5 mg p.o. at bedtime. 10.Lorazepam 1 mg p.o. at bedtime. 11.Lopressor 50 mg p.o. b.i.d. 12.Naproxen 500 p.o. b.i.d. p.r.n. 13.Protonix 40 mg p.o. daily. 14.Potassium chloride/K-Dur 20 mEq p.o. daily.  She will have Pro-time and INR done daily and have her Coumadin titrated between 2 and 3 in the nursing facility where she will go for physical rehab and monitoring of her pulmonary status.     Melvyn Novas, MD     RMD/MEDQ  D:  04/11/2011  T:  04/11/2011  Job:  [id number]

## 2011-04-11 NOTE — Progress Notes (Signed)
Physical Therapy Treatment Patient Details Name: Sheri Matthews MRN: [id number] DOB: 1924/01/20 Today's Date: 04/11/2011  PT Assessment/Plan  Therapy attempted x2 this morning;pt refused to having an upset stomach, will attempt at a later date when appropriate PT Goals     PT Treatment Precautions/Restrictions  Precautions Precautions: Fall Required Braces or Orthoses: No Restrictions Weight Bearing Restrictions: No Mobility (including Balance)      Exercise    End of Session    [name] 04/11/2011, 9:43 AM

## 2011-04-11 NOTE — Progress Notes (Signed)
Patient d/c to [facility] for Rehab. Report called to [facility], nurse Left floor via wheelchair accompanied by staff and family Discharge packet sent by staff No c/o pain at d/c  Angelia Hazell, Kae Heller

## 2011-05-23 ENCOUNTER — Encounter (HOSPITAL_COMMUNITY): Payer: Self-pay | Admitting: *Deleted

## 2011-05-23 ENCOUNTER — Inpatient Hospital Stay (HOSPITAL_COMMUNITY)
Admission: EM | Admit: 2011-05-23 | Discharge: 2011-05-26 | DRG: 641 | Disposition: A | Discharge status: Home / Self Care | Payer: Medicare Other | Attending: Family Medicine | Admitting: Family Medicine

## 2011-05-23 ENCOUNTER — Emergency Department (HOSPITAL_COMMUNITY): Payer: Medicare Other

## 2011-05-23 ENCOUNTER — Other Ambulatory Visit: Payer: Self-pay

## 2011-05-23 DIAGNOSIS — Z9181 History of falling: Secondary | ICD-10-CM

## 2011-05-23 DIAGNOSIS — M810 Age-related osteoporosis without current pathological fracture: Secondary | ICD-10-CM | POA: Diagnosis present

## 2011-05-23 DIAGNOSIS — K219 Gastro-esophageal reflux disease without esophagitis: Secondary | ICD-10-CM | POA: Diagnosis present

## 2011-05-23 DIAGNOSIS — Z7901 Long term (current) use of anticoagulants: Secondary | ICD-10-CM

## 2011-05-23 DIAGNOSIS — N179 Acute kidney failure, unspecified: Secondary | ICD-10-CM | POA: Diagnosis present

## 2011-05-23 DIAGNOSIS — R001 Bradycardia, unspecified: Secondary | ICD-10-CM

## 2011-05-23 DIAGNOSIS — Z8673 Personal history of transient ischemic attack (TIA), and cerebral infarction without residual deficits: Secondary | ICD-10-CM

## 2011-05-23 DIAGNOSIS — N289 Disorder of kidney and ureter, unspecified: Secondary | ICD-10-CM

## 2011-05-23 DIAGNOSIS — E875 Hyperkalemia: Principal | ICD-10-CM | POA: Diagnosis present

## 2011-05-23 DIAGNOSIS — E785 Hyperlipidemia, unspecified: Secondary | ICD-10-CM | POA: Diagnosis present

## 2011-05-23 DIAGNOSIS — I129 Hypertensive chronic kidney disease with stage 1 through stage 4 chronic kidney disease, or unspecified chronic kidney disease: Secondary | ICD-10-CM | POA: Diagnosis present

## 2011-05-23 DIAGNOSIS — N183 Chronic kidney disease, stage 3 unspecified: Secondary | ICD-10-CM | POA: Diagnosis present

## 2011-05-23 DIAGNOSIS — I4891 Unspecified atrial fibrillation: Secondary | ICD-10-CM | POA: Diagnosis present

## 2011-05-23 LAB — URINE CULTURE: Colony Count: 60000

## 2011-05-23 LAB — BASIC METABOLIC PANEL
CO2: 22 mEq/L (ref 19–32)
Chloride: 115 mEq/L — ABNORMAL HIGH (ref 96–112)
Glucose, Bld: 124 mg/dL — ABNORMAL HIGH (ref 70–99)
Potassium: >7.5 mEq/L (ref 3.5–5.1)
Sodium: 142 mEq/L (ref 135–145)

## 2011-05-23 LAB — DIFFERENTIAL
Basophils Absolute: 0 10*3/uL (ref 0.0–0.1)
Eosinophils Relative: 0 % (ref 0–5)
Lymphocytes Relative: 12 % (ref 12–46)
Lymphs Abs: 1.3 10*3/uL (ref 0.7–4.0)
Neutro Abs: 8.4 10*3/uL — ABNORMAL HIGH (ref 1.7–7.7)
Neutrophils Relative %: 79 % — ABNORMAL HIGH (ref 43–77)

## 2011-05-23 LAB — URINALYSIS, ROUTINE W REFLEX MICROSCOPIC
Glucose, UA: NEGATIVE mg/dL
Protein, ur: NEGATIVE mg/dL
Specific Gravity, Urine: 1.02 (ref 1.005–1.030)
pH: 5.5 (ref 5.0–8.0)

## 2011-05-23 LAB — CBC
MCV: 92.5 fL (ref 78.0–100.0)
Platelets: 262 10*3/uL (ref 150–400)
RBC: 3.88 MIL/uL (ref 3.87–5.11)
RDW: 16.2 % — ABNORMAL HIGH (ref 11.5–15.5)
WBC: 10.5 10*3/uL (ref 4.0–10.5)

## 2011-05-23 LAB — URINE MICROSCOPIC-ADD ON

## 2011-05-23 LAB — POTASSIUM: Potassium: >7.5 mEq/L (ref 3.5–5.1)

## 2011-05-23 MED ORDER — DEXTROSE 50 % IV SOLN
INTRAVENOUS | Status: AC
  Administered 2011-05-23: 50 mL via INTRAVENOUS
  Filled 2011-05-23: qty 50

## 2011-05-23 MED ORDER — SODIUM CHLORIDE 0.9 % IV SOLN
1.0000 g | Freq: Once | INTRAVENOUS | Status: AC
  Administered 2011-05-23 (×2): 1 g via INTRAVENOUS

## 2011-05-23 MED ORDER — SODIUM CHLORIDE 0.9 % IV SOLN: 1.0000 g | INTRAVENOUS | Status: DC

## 2011-05-23 MED ORDER — SODIUM CHLORIDE 0.9 % IV SOLN
INTRAVENOUS | Status: DC
  Administered 2011-05-23: 19:00:00 via INTRAVENOUS

## 2011-05-23 MED ORDER — ALBUTEROL SULFATE (5 MG/ML) 0.5% IN NEBU
5.0000 mg | INHALATION_SOLUTION | Freq: Once | RESPIRATORY_TRACT | Status: AC
  Administered 2011-05-23: 5 mg via RESPIRATORY_TRACT
  Filled 2011-05-23: qty 1

## 2011-05-23 MED ORDER — INSULIN ASPART 100 UNIT/ML IV SOLN
10.0000 [IU] | Freq: Once | INTRAVENOUS | Status: AC
  Administered 2011-05-23: 10 [IU] via INTRAVENOUS
  Filled 2011-05-23: qty 0.1

## 2011-05-23 MED ORDER — DEXTROSE 50 % IV SOLN
50.0000 mL | Freq: Once | INTRAVENOUS | Status: AC
  Administered 2011-05-23: 50 mL via INTRAVENOUS

## 2011-05-23 MED ORDER — SODIUM CHLORIDE 0.9 % IV SOLN
INTRAVENOUS | Status: AC
  Administered 2011-05-23: 1 g via INTRAVENOUS
  Filled 2011-05-23: qty 10

## 2011-05-23 MED ORDER — SODIUM CHLORIDE 0.9 % IV SOLN: 1.0000 g | Freq: Once | INTRAVENOUS | Status: DC

## 2011-05-23 NOTE — ED Notes (Signed)
Brady rate 39 with peaked T wave continues.  Patient pleasantly confused. Does not provide her birth year - gave [birth year].  Other inappropriate comments - does follow instructions.  Resting with eyes closed after breathing treatment completed.

## 2011-05-23 NOTE — ED Notes (Signed)
EKG completed and shown to Dr. Effie Shy.

## 2011-05-23 NOTE — ED Provider Notes (Signed)
This chart was scribed for Flint Melter, MD by Williemae Natter. The patient was seen in room [facility]14[facility] and the patient's care was started at 6:15 PM  CSN: [id number]  Arrival date & time 05/23/11  1801   First MD Initiated Contact with Patient 05/23/11 1819      Chief Complaint  Patient presents with  . Bradycardia    (Consider location/radiation/quality/duration/timing/severity/associated sxs/prior treatment) HPI Level 5 caveat due to condition of the pt- altered mental status; poor historian Sheri Matthews is a 78 y.o. female who presents to the Emergency Department complaining of a fall. Neighbor called EMS and stated that she became weak and fell. Pt was given 800 cc fluid and 1 mg atropine in ambulance and was bradycardic and hypotensive in the field. Pt hurt right shoulder and side when she fell. Pt is unsure of why she is in the hospital.  Past Medical History  Diagnosis Date  . Hypertension   . Arthritis   . Shortness of breath   . Stroke   . GERD (gastroesophageal reflux disease)   . Dysrhythmia   . Pneumonia   . Depression   . Anemia     Past Surgical History  Procedure Date  . Back surgery   . Ovaries removed    No family history on file.  History  Substance Use Topics  . Smoking status: Never Smoker   . Smokeless tobacco: Not on file  . Alcohol Use: No    OB History    Grav Para Term Preterm Abortions TAB SAB Ect Mult Living                  Review of Systems  Unable to perform ROS   Allergies  Review of patient's allergies indicates no known allergies.  Home Medications   Current Outpatient Rx  Name Route Sig Dispense Refill  . ALBUTEROL SULFATE (5 MG/ML) 0.5% IN NEBU Nebulization Take 0.5 mLs (2.5 mg total) by nebulization every 4 (four) hours as needed for wheezing or shortness of breath. 20 mL 3  . BENAZEPRIL HCL 5 MG PO TABS Oral Take 1 tablet (5 mg total) by mouth daily. 30 tablet 2  . CALCIUM CARBONATE-VITAMIN D 500-200 MG-UNIT PO  TABS Oral Take 1 tablet by mouth daily. 30 tablet 2  . DILTIAZEM HCL ER COATED BEADS 180 MG PO CP24 Oral Take 1 capsule (180 mg total) by mouth daily. 30 capsule 2  . DONEPEZIL HCL 5 MG PO TABS Oral Take 5 mg by mouth at bedtime.     Marland Kitchen LORAZEPAM 1 MG PO TABS Oral Take 1 mg by mouth at bedtime.     Marland Kitchen METOPROLOL TARTRATE 50 MG PO TABS Oral Take 50 mg by mouth 2 (two) times daily.      Marland Kitchen NAPROXEN 500 MG PO TABS Oral Take 1 tablet (500 mg total) by mouth 2 (two) times daily. 30 tablet 0  . OXYCODONE-ACETAMINOPHEN 5-325 MG PO TABS Oral Take 1 tablet by mouth every 8 (eight) hours as needed.      Marland Kitchen PANTOPRAZOLE SODIUM 40 MG PO TBEC Oral Take 20 mg by mouth daily.     Marland Kitchen POTASSIUM CHLORIDE ER 10 MEQ PO TBCR Oral Take 2 tablets (20 mEq total) by mouth 2 (two) times daily. 30 tablet 0  . ROSUVASTATIN CALCIUM 10 MG PO TABS Oral Take 1 tablet (10 mg total) by mouth daily at 6 PM. 30 tablet 2  . WARFARIN SODIUM 2.5 MG PO TABS Oral  Take 1 tablet (2.5 mg total) by mouth one time only at 6 PM. 30 tablet 0  . AMLODIPINE BESYLATE 5 MG PO TABS Oral Take 5 mg by mouth daily.        BP 135/56  Pulse 44  Temp(Src) 97.9 F (36.6 C) (Oral)  Resp 20  Ht 5' (1.524 m)  Wt 135 lb (61.236 kg)  BMI 26.37 kg/m2  SpO2 97%  Physical Exam  Constitutional: She is oriented to person, place, and time. She appears well-developed and well-nourished.       Poor historian  HENT:  Head: Normocephalic and atraumatic.  Right Ear: External ear normal.  Left Ear: External ear normal.  Mouth/Throat: Oropharynx is clear and moist.       Neck is tender to cervical spine  Eyes: Conjunctivae and EOM are normal. Pupils are equal, round, and reactive to light.  Neck: Normal range of motion. Neck supple.  Cardiovascular: Bradycardia present.   Pulmonary/Chest: Breath sounds normal.  Abdominal: Soft. Bowel sounds are normal. There is tenderness (mild tenderness).  Musculoskeletal:       Diffuse spine and back tenderness No  contusions Mild tenderness to right shoulder Moves left arm and shoulder normally Legs are non-tender with normal ROM  Neurological: She is alert and oriented to person, place, and time.       Poor recent memory recall  Skin: Skin is warm and dry.  Psychiatric: She has a normal mood and affect. Her behavior is normal.    ED Course  Procedures (including critical care time) 7:20 PM Recheck: Pt's heart rate is now up to 44 bpm.  Labs Reviewed  CBC - Abnormal; Notable for the following:    Hemoglobin 11.2 (*)    HCT 35.9 (*)    RDW 16.2 (*)    All other components within normal limits  DIFFERENTIAL - Abnormal; Notable for the following:    Neutrophils Relative 79 (*)    Neutro Abs 8.4 (*)    All other components within normal limits  BASIC METABOLIC PANEL - Abnormal; Notable for the following:    Potassium >7.5 (*)    Chloride 115 (*)    Glucose, Bld 124 (*)    BUN 29 (*)    Creatinine, Ser 2.57 (*)    GFR calc non Af Amer 16 (*)    GFR calc Af Amer 18 (*)    All other components within normal limits  URINALYSIS, ROUTINE W REFLEX MICROSCOPIC - Abnormal; Notable for the following:    Hgb urine dipstick TRACE (*)    Leukocytes, UA MODERATE (*)    All other components within normal limits  POTASSIUM - Abnormal; Notable for the following:    Potassium >7.5 (*)    All other components within normal limits  URINE MICROSCOPIC-ADD ON - Abnormal; Notable for the following:    Squamous Epithelial / LPF FEW (*)    Bacteria, UA FEW (*)    All other components within normal limits  TROPONIN I  URINE CULTURE   Dg Chest Port 1 View  05/23/2011  *RADIOLOGY REPORT*  Clinical Data: Bradycardia, weakness, fall  PORTABLE CHEST - 1 VIEW  Comparison: 04/06/2011  Findings: Defibrillator pad over the chest.  Mild cardiac enlargement and vascular congestion.  Negative for CHF, definite pneumonia, collapse, consolidation, effusion or pneumothorax. Atherosclerosis of the aorta.  IMPRESSION:  Cardiomegaly without CHF or pneumonia  Original Report Authenticated By: Judie Petit. Ruel Favors, M.D.     1. Hyperkalemia   2.  Renal insufficiency   3. Bradycardia       MDM  Dehydration with renal insufficiency, and hyperkalemia associated with administration of oral potassium. Patient had secondary bradycardia in emergency department. Blood pressure was stable throughout the ED course. She was admitted to a monitored bed by her primary care Dr.  I personally performed the services described in this documentation, which was scribed in my presence. The recorded information has been reviewed and considered.         Flint Melter, MD 05/23/11 2342

## 2011-05-23 NOTE — ED Notes (Signed)
Lab called with critical value for this patient K+ >7.5  Dr Effie Shy notified.  Orders given.

## 2011-05-23 NOTE — ED Notes (Signed)
Patient complained of discomfort at IV site - IV site checked prior to infusion of medications - good blood return, medications administered as ordered

## 2011-05-23 NOTE — ED Notes (Addendum)
Lab called with critical K+ of > 7.5    Telephone orders received from Dr Felecia Shelling  Patient agitated / confused attempts made to orient her .

## 2011-05-23 NOTE — ED Notes (Signed)
Daughter  [name]  Ph# [phone]  Spoke with Arrie Eastern RN and verified medications but could not verify if patient had taken any today.  Advised her mother will be admitted.  Did not come to hospital because she has not had a flu shot.

## 2011-05-23 NOTE — ED Notes (Signed)
Pt brought in by [facility] EMS called in reference to fall; per EMS, pt states she became weak and fell-denies LOC. Per EMS, pt initial heart rate in 30s; was given Atropine 1mg  IV en route and HR increased to 40s.  Placed on cardiac monitor-rate 46 and irregular. A&ox4; answers questions appropriately; denies pain.

## 2011-05-23 NOTE — ED Notes (Signed)
Patient complained of being dizzy while standing. Patient lost balance and stumbled while standing.

## 2011-05-23 NOTE — ED Notes (Signed)
IV meds Calcium had infused. Patient requested sip of H2O - given. Followed by sudden projectile vomiting large amount yellow/green liquid with small particles on non-digested food.   MD notified

## 2011-05-24 ENCOUNTER — Other Ambulatory Visit: Payer: Self-pay

## 2011-05-24 ENCOUNTER — Encounter (HOSPITAL_COMMUNITY): Payer: Self-pay

## 2011-05-24 LAB — BASIC METABOLIC PANEL
Calcium: 10.5 mg/dL (ref 8.4–10.5)
GFR calc Af Amer: 33 mL/min — ABNORMAL LOW (ref >90)
GFR calc non Af Amer: 28 mL/min — ABNORMAL LOW (ref >90)
Glucose, Bld: 96 mg/dL (ref 70–99)
Sodium: 143 mEq/L (ref 135–145)

## 2011-05-24 LAB — CK: Total CK: 28 U/L (ref 7–177)

## 2011-05-24 LAB — PRO B NATRIURETIC PEPTIDE: Pro B Natriuretic peptide (BNP): 3505 pg/mL — ABNORMAL HIGH (ref 0–450)

## 2011-05-24 LAB — MAGNESIUM: Magnesium: 1.5 mg/dL (ref 1.5–2.5)

## 2011-05-24 LAB — CARDIAC PANEL(CRET KIN+CKTOT+MB+TROPI): CK, MB: 1.8 ng/mL (ref 0.3–4.0)

## 2011-05-24 LAB — PROTIME-INR: INR: 1.36 (ref 0.00–1.49)

## 2011-05-24 MED ORDER — SODIUM POLYSTYRENE SULFONATE 15 GM/60ML PO SUSP: 30.0000 g | ORAL | Status: DC

## 2011-05-24 MED ORDER — ACETAMINOPHEN 325 MG PO TABS
ORAL_TABLET | ORAL | Status: AC
  Filled 2011-05-24: qty 2

## 2011-05-24 MED ORDER — WARFARIN SODIUM 5 MG PO TABS
5.0000 mg | ORAL_TABLET | Freq: Once | ORAL | Status: AC
  Administered 2011-05-24: 5 mg via ORAL
  Filled 2011-05-24: qty 1

## 2011-05-24 MED ORDER — SODIUM POLYSTYRENE SULFONATE 15 GM/60ML PO SUSP
ORAL | Status: AC
  Administered 2011-05-24: 60 g
  Filled 2011-05-24: qty 240

## 2011-05-24 MED ORDER — SODIUM CHLORIDE 0.9 % IV SOLN
INTRAVENOUS | Status: DC
  Administered 2011-05-24 – 2011-05-25 (×3): via INTRAVENOUS

## 2011-05-24 MED ORDER — LORAZEPAM 0.5 MG PO TABS
0.5000 mg | ORAL_TABLET | Freq: Every day | ORAL | Status: DC
  Administered 2011-05-24 – 2011-05-25 (×2): 0.5 mg via ORAL
  Filled 2011-05-24 (×2): qty 1

## 2011-05-24 MED ORDER — ACETAMINOPHEN 325 MG PO TABS
650.0000 mg | ORAL_TABLET | ORAL | Status: DC | PRN
  Administered 2011-05-24 – 2011-05-26 (×5): 650 mg via ORAL
  Filled 2011-05-24 (×4): qty 2

## 2011-05-24 MED ORDER — SODIUM POLYSTYRENE SULFONATE 15 GM/60ML PO SUSP
30.0000 g | ORAL | Status: AC
  Administered 2011-05-24: 30 g via RECTAL
  Filled 2011-05-24: qty 60

## 2011-05-24 MED ORDER — SODIUM POLYSTYRENE SULFONATE 15 GM/60ML PO SUSP: 30.0000 g | Freq: Once | ORAL | Status: DC

## 2011-05-24 MED ORDER — METOPROLOL TARTRATE 25 MG PO TABS
25.0000 mg | ORAL_TABLET | Freq: Two times a day (BID) | ORAL | Status: DC
  Administered 2011-05-24 – 2011-05-26 (×5): 25 mg via ORAL
  Filled 2011-05-24 (×5): qty 1

## 2011-05-24 MED ORDER — ROSUVASTATIN CALCIUM 5 MG PO TABS
5.0000 mg | ORAL_TABLET | Freq: Every day | ORAL | Status: DC
  Administered 2011-05-24 – 2011-05-26 (×3): 5 mg via ORAL
  Filled 2011-05-24 (×3): qty 1

## 2011-05-24 MED ORDER — SODIUM CHLORIDE 0.9 % IV SOLN
1.0000 g | Freq: Once | INTRAVENOUS | Status: AC
  Administered 2011-05-24: 1 g via INTRAVENOUS
  Filled 2011-05-24: qty 10

## 2011-05-24 MED ORDER — SODIUM POLYSTYRENE SULFONATE 15 GM/60ML PO SUSP
30.0000 g | ORAL | Status: DC
  Administered 2011-05-24: 30 g via ORAL
  Filled 2011-05-24: qty 60

## 2011-05-24 MED ORDER — AMLODIPINE BESYLATE 5 MG PO TABS
5.0000 mg | ORAL_TABLET | Freq: Every day | ORAL | Status: DC
  Administered 2011-05-24 – 2011-05-26 (×3): 5 mg via ORAL
  Filled 2011-05-24 (×3): qty 1

## 2011-05-24 MED ORDER — SODIUM POLYSTYRENE SULFONATE 15 GM/60ML PO SUSP: 60.0000 g | Freq: Once | ORAL | Status: DC

## 2011-05-24 MED ORDER — SODIUM BICARBONATE 8.4 % IV SOLN
INTRAVENOUS | Status: DC
  Filled 2011-05-24 (×2): qty 1000

## 2011-05-24 MED ORDER — SODIUM BICARBONATE 8.4 % IV SOLN
INTRAVENOUS | Status: AC
  Filled 2011-05-24: qty 100

## 2011-05-24 MED ORDER — FUROSEMIDE 10 MG/ML IJ SOLN
40.0000 mg | Freq: Once | INTRAMUSCULAR | Status: AC
Start: 2011-05-24 — End: 2011-05-24
  Administered 2011-05-24: 40 mg via INTRAVENOUS
  Filled 2011-05-24: qty 4

## 2011-05-24 NOTE — H&P (Signed)
NAME:  Sheri Matthews, Sheri Matthews NO.:  [id number]  MEDICAL RECORD NO.:  [id number]  LOCATION:  A336                          FACILITY:  [facility]  PHYSICIAN:  Melvyn Novas, MDDATE OF BIRTH:  [birth year]  DATE OF ADMISSION:  05/23/2011 DATE OF DISCHARGE:  LH                             HISTORY & PHYSICAL   The patient is an 78 year old white female, who was recently hospitalized for a fall and underwent rehab, has been home for about 3-4 weeks, doing fairly well, has chronic atrial fibrillation, currently on anticoagulation with Coumadin, history of hypertension, COPD, history of remote CVA, depression, anemia.  Apparently, the patient is at home and fell and became weak again this evening.  She was seen in the ER, given some fluids in the ambulance, noted to have some bradycardia and hypotension in the field.  She was seen in the ER, was found to have normal systolic pressures and atrial fibrillation, moderate ventricular response which is normal for her and there was a potassium of 7.5 and a creatinine of 2.57.  I cannot find previous creatinine to compare.  She was given dextrose, insulin, and calcium, and repeat potassium this morning is 7.5, we will administer Kayexalate and obtain Renal consult as well as aggressive IV fluids.  She denies angina, orthopnea, PND, antecedent palpitations prior to falling.  PAST MEDICAL HISTORY:  Significant for chronic atrial fibrillation, anticoagulation, hypertension, degenerative joint disease, history of remote stroke, GERD, pneumonia, depression, and anemia.  She has had laminectomy of lumbar spine and TAHBSO.  She does not smoke.  Does not drink alcohol.  Lives close to her 2 daughters.  Current medicines are benazepril 5 mg p.o. daily, calcium carbonate/vitamin D 500/200 one p.o. daily, diltiazem 180 p.o. daily, Aricept 5 mg p.o. at bedtime, lorazepam 1 mg p.o. at bedtime, metoprolol tartrate 50 mg p.o. b.i.d., Naprosyn  500 p.o. b.i.d. p.r.n. Protonix 40 mg p.o. daily, potassium chloride 10 mEq 2 tablets p.o. daily, Crestor 10 mg p.o. daily, and warfarin 2.5 mg p.o. daily.  PHYSICAL EXAMINATION:  VITAL SIGNS:  Blood pressure is 140/72, temperature is 98.8, pulse is 63 and regular, respiratory rate is 20. HEENT:  Eyes, PERRLA intact.  Sclerae clear.  Conjunctivae pink. NECK:  Shows no JVD.  No carotid bruits, no thyromegaly, and thyroid bruits. LUNGS:  Clear to A and P.  No rales, wheeze, or rhonchi appreciable. HEART:  Irregularly irregular.  No S3, gallop, 1/6 aortic outflow murmur noted, systolic.  No heaves, thrills, or rubs appreciated. ABDOMEN:  Soft, nontender.  Bowel sounds normoactive.  No guarding, rebound, mass, or organomegaly. EXTREMITIES:  No clubbing, cyanosis, or edema. NEUROLOGIC:  Cranial nerves II-XII grossly intact.  Patient moves all 4 extremities.  Plantars are downgoing.  IMPRESSION: 1. Fall. 2. Renal failure, possibly acute. 3. Chronic atrial fibrillation. 4. Anticoagulation on Coumadin. 5. Hypertension. 6. Osteoporosis. 7. Gastroesophageal reflux disease. 8. History of cerebrovascular accident, remote.  PLAN:  Right now is to give Kayexalate 30 g p.o. now.  Repeat BMET in several hours.  Renal consult, infuse with intravenous normal saline aggressively to reverse any prerenal component and will make further recommendations as the database expands.  Melvyn Novas, MD     RMD/MEDQ  D:  05/24/2011  T:  05/24/2011  Job:  [id number]

## 2011-05-24 NOTE — H&P (Signed)
[id number] 

## 2011-05-24 NOTE — ED Notes (Signed)
Report called to 336 RN   .  Kerlix applied to iv site with opening exposing actual site.  Patient talking about Jesus, not oriented to place

## 2011-05-24 NOTE — Progress Notes (Signed)
Finally got patient to drink the 60 grams of Kayexalate.  She held it down for about 30 minutes, then vomited up part of it.  It did not appear that she vomited the entire dose.  She has another dose due at 05:45.  Patient did not want to take the medication at all and I explained to her the necessity of taking this medication and why it was being given.  She finally seemed to understand.  She did refuse the blood draw of the stat labs that had been ordered, so the lab personnel was going to see if these could be added on to labs drawn in the ER last night.

## 2011-05-24 NOTE — Consult Note (Signed)
NATIYA SEELINGER MRN: [id number] DOB/AGE: 12/15/1923 78 y.o. Primary Care Physician:DONDIEGO,RICHARD M, MD Admit date: 05/23/2011 Chief Complaint:  Chief Complaint  Patient presents with  . Bradycardia   HPI:  Pt is  a 78 y.o. female with PMhx of HTN, CVA who presented  to  ER with Chief  complain of a fall.  Pt neighbor called EMS and stated that she became weak and fell. Pt in ER was found to be Hyperkalemic.\\Pt refused PO akyexalte Pt was started on IV calcium + nebulizer + lasix Pt was alter given Pr Kayexalate Pt responded well to treatment Pt now feels much better  NO c/o Chest pain  No c/o nausea/ Vomiting No co syncopee  No c/o dizziness   Past Medical History  Diagnosis Date  . Hypertension   . Arthritis   . Shortness of breath   . Stroke   . GERD (gastroesophageal reflux disease)   . Dysrhythmia   . Pneumonia   . Depression   . Anemia         History reviewed. No pertinent family history.  Social History:  reports that she has never smoked. She has never used smokeless tobacco. She reports that she does not drink alcohol or use illicit drugs.   Allergies: No Known Allergies  Medications Prior to Admission  Medication Dose Route Frequency Provider Last Rate Last Dose  . 0.9 %  sodium chloride infusion   Intravenous Continuous Duke Salvia Dondiego 100 mL/hr at 05/24/11 4098    . acetaminophen (TYLENOL) 325 MG tablet           . acetaminophen (TYLENOL) tablet 650 mg  650 mg Oral Q4H PRN Duke Salvia Dondiego   650 mg at 05/24/11 1191  . albuterol (PROVENTIL) (5 MG/ML) 0.5% nebulizer solution 5 mg  5 mg Nebulization Once Flint Melter, MD   5 mg at 05/23/11 2031  . amLODipine (NORVASC) tablet 5 mg  5 mg Oral Daily Richard M Dondiego   5 mg at 05/24/11 4782  . calcium gluconate 1 g in sodium chloride 0.9 % 100 mL IVPB  1 g Intravenous Once Flint Melter, MD   1 g at 05/23/11 2028  . calcium gluconate 1 g in sodium chloride 0.9 % 100 mL IVPB  1 g Intravenous Once  Manpreet S Bhutani   1 g at 05/24/11 9562  . dextrose 50 % solution 50 mL  50 mL Intravenous Once Flint Melter, MD   50 mL at 05/23/11 2027  . furosemide (LASIX) injection 40 mg  40 mg Intravenous Once Manpreet S Bhutani   40 mg at 05/24/11 0823  . insulin aspart (novoLOG) injection 10 Units  10 Units Intravenous Once Flint Melter, MD   10 Units at 05/23/11 2051  . LORazepam (ATIVAN) tablet 0.5 mg  0.5 mg Oral QHS Richard M Dondiego      . metoprolol tartrate (LOPRESSOR) tablet 25 mg  25 mg Oral BID Duke Salvia Dondiego   25 mg at 05/24/11 1308  . rosuvastatin (CRESTOR) tablet 5 mg  5 mg Oral Daily Richard M Dondiego   5 mg at 05/24/11 6578  . sodium polystyrene (KAYEXALATE) 15 GM/60ML suspension 30 g  30 g Oral Once [employer]      . sodium polystyrene (KAYEXALATE) 15 GM/60ML suspension 30 g  30 g Rectal Q4H Manpreet S Bhutani   30 g at 05/24/11 4696  . sodium polystyrene (KAYEXALATE) 15 GM/60ML suspension 60 g  60 g  Oral Once Tesfaye Fanta      . sodium polystyrene (KAYEXALATE) 15 GM/60ML suspension        60 g at 05/24/11 0259  . warfarin (COUMADIN) tablet 5 mg  5 mg Oral ONCE-1800 Scott A Hall, PHARMD      . DISCONTD: 0.9 %  sodium chloride infusion   Intravenous Continuous Flint Melter, MD 125 mL/hr at 05/23/11 1842    . DISCONTD: calcium gluconate 930 mg of elemental calcium in sodium chloride 0.9 % 1,000 mL infusion  1 g Intravenous Continuous Flint Melter, MD      . DISCONTD: calcium gluconate 930 mg of elemental calcium in sodium chloride 0.9 % 1,000 mL infusion  1 g Intravenous Once Flint Melter, MD      . DISCONTD: dextrose 5 % 1,000 mL with sodium bicarbonate 100 mEq infusion   Intravenous Continuous Tesfaye Fanta      . DISCONTD: sodium polystyrene (KAYEXALATE) 15 GM/60ML suspension 30 g  30 g Oral Q3H Tesfaye Fanta      . DISCONTD: sodium polystyrene (KAYEXALATE) 15 GM/60ML suspension 30 g  30 g Oral Q3H Richard M Dondiego   30 g at 05/24/11 0545   Medications  Prior to Admission  Medication Sig Dispense Refill  . albuterol (PROVENTIL) (5 MG/ML) 0.5% nebulizer solution Take 0.5 mLs (2.5 mg total) by nebulization every 4 (four) hours as needed for wheezing or shortness of breath.  20 mL  3  . benazepril (LOTENSIN) 5 MG tablet Take 1 tablet (5 mg total) by mouth daily.  30 tablet  2  . calcium-vitamin D (OSCAL WITH D) 500-200 MG-UNIT per tablet Take 1 tablet by mouth daily.  30 tablet  2  . diltiazem (CARDIZEM CD) 180 MG 24 hr capsule Take 1 capsule (180 mg total) by mouth daily.  30 capsule  2  . donepezil (ARICEPT) 5 MG tablet Take 5 mg by mouth at bedtime.       Marland Kitchen LORazepam (ATIVAN) 1 MG tablet Take 1 mg by mouth at bedtime.       . metoprolol (LOPRESSOR) 50 MG tablet Take 50 mg by mouth 2 (two) times daily.        . naproxen (NAPROSYN) 500 MG tablet Take 1 tablet (500 mg total) by mouth 2 (two) times daily.  30 tablet  0  . pantoprazole (PROTONIX) 40 MG tablet Take 20 mg by mouth daily.       . potassium chloride (K-DUR) 10 MEQ tablet Take 2 tablets (20 mEq total) by mouth 2 (two) times daily.  30 tablet  0  . rosuvastatin (CRESTOR) 10 MG tablet Take 1 tablet (10 mg total) by mouth daily at 6 PM.  30 tablet  2  . warfarin (COUMADIN) 2.5 MG tablet Take 1 tablet (2.5 mg total) by mouth one time only at 6 PM.  30 tablet  0  . amLODipine (NORVASC) 5 MG tablet Take 5 mg by mouth daily.             YQM:VHQIO from the symptoms mentioned above,there are no other symptoms referable to all systems reviewed.     Marland Kitchen acetaminophen      . albuterol  5 mg Nebulization Once  . amLODipine  5 mg Oral Daily  . calcium gluconate  1 g Intravenous Once  . calcium gluconate  1 g Intravenous Once  . dextrose  50 mL Intravenous Once  . furosemide  40 mg Intravenous Once  . insulin aspart  10 Units Intravenous Once  . LORazepam  0.5 mg Oral QHS  . metoprolol tartrate  25 mg Oral BID  . rosuvastatin  5 mg Oral Daily  . sodium polystyrene  30 g Oral Once  . sodium  polystyrene  30 g Rectal Q4H  . sodium polystyrene  60 g Oral Once  . sodium polystyrene      . warfarin  5 mg Oral ONCE-1800  . DISCONTD: calcium gluconate 10g/L infusion  1 g Intravenous Once  . DISCONTD: sodium polystyrene  30 g Oral Q3H  . DISCONTD: sodium polystyrene  30 g Oral Q3H         ZOX:WRUEA from the symptoms mentioned above,there are no other symptoms referable to all systems reviewed.  Physical Exam: Vital signs in last 24 hours: Temp:  [97.9 F (36.6 C)-98.8 F (37.1 C)] 98.3 F (36.8 C) (01/09 1400) Pulse Rate:  [44-78] 78  (01/09 1400) Resp:  [20] 20  (01/09 1400) BP: (116-140)/(50-85) 126/85 mmHg (01/09 1400) SpO2:  [93 %-98 %] 93 % (01/09 1400) Weight:  [126 lb (57.153 kg)-135 lb (61.236 kg)] 126 lb (57.153 kg) (01/09 0726) Weight change:  Last BM Date: 05/24/11  Intake/Output from previous day: 01/08 0701 - 01/09 0700 In: 800 [I.V.:800] Out: 300 [Emesis/NG output:300] Total I/O In: 240 [P.O.:240] Out: 600 [Urine:600]   Physical Exam: General- pt is awake,alert, oriented to time place and person Resp- No acute REsp distress, CTA B/L NO Rhonchi CVS- S1S2 regular in rate and rhythm GIT- BS+, soft, NT, ND EXT- NO LE Edema, Cyanosis CNS- CN 2-12 grossly intact. Moving all 4 extremities Psych- normal mod and affect    Lab Results: CBC  Basename 05/23/11 1849  WBC 10.5  HGB 11.2*  HCT 35.9*  PLT 262    BMET  Basename 05/24/11 1035 05/23/11 2204 05/23/11 1849  NA 143 -- 142  K 4.1 >7.5* --  CL 109 -- 115*  CO2 26 -- 22  GLUCOSE 96 -- 124*  BUN 19 -- 29*  CREATININE 1.60* -- 2.57*  CALCIUM 10.5 -- 10.1    TRend  K 4.1<==7.5 Trend Crea  2013   1.6<==2.57 2012   1.6--2.0 ( 0.9--1.0 for few days ) 2011   1.1--2.0  MICRO No results found for this or any previous visit (from the past 240 hour(s)).    Lab Results  Component Value Date   CALCIUM 10.5 05/24/2011      Impression: 1)Renal  AKI secondary to Pre Renal vs  ATN               AKI secondary to SIRS               AKI most Likley secondary to Prenal + ACE              AKIresponded well to IVF              Crear trending low               CKD stage 3 .               CKD since 2011               CKD secondary to HTN/ Age related factors                2)HTN   Now at goal               Target Organ damage  CKD                CVA               Medication-              Was On RAS blockers-now d/ced               On Diuretics-Lasix              O n Calcium Channel Blockers              On Beta blockers   3)Anemia HGb at goal (9--11)   4)CKD Mineral-Bone Disorder-will check                PTH not avail                 Secondary Hyperparathyroidism w/u pending .                Phosphorus will check                Vitamin 25-OH will check.  5)CVS- hemodynamically stable               HR better than before  6)FEN  Hyperkalemic    AKI + ACE + KCL + NSAIDS + Beta blockers  NOrmonatremic   7)Acid base Co2 at goal     Plan:  Pt responded to IV lasix + PR Kayexalate Will suggest to avoid ACE, NSAIDS and KCL as outpt Will suggest to follow up BMet closely Will suggest to keep on IVF @ 161ml/hr Will ask for CKD- BMD labs      BHUTANI,MANPREET S 05/24/2011, 4:28 PM

## 2011-05-24 NOTE — Progress Notes (Signed)
Notified Dr. Janna Arch of patients EKG, atrial fibrillation as well as new lab results. MD stated understanding with no further orders at this time.

## 2011-05-24 NOTE — Consult Note (Signed)
ANTICOAGULATION CONSULT NOTE - Initial Consult  Pharmacy Consult for Warfarin Indication: atrial fibrillation  No Known Allergies  Patient Measurements: Height: 5\' 1"  (154.9 cm) Weight: 126 lb (57.153 kg) IBW/kg (Calculated) : 47.8   Vital Signs: Temp: 98.8 F (37.1 C) (01/09 0726) Temp src: Oral (01/09 0726) BP: 140/72 mmHg (01/09 0726) Pulse Rate: 63  (01/09 0726)  Labs:  Basename 05/24/11 1035 05/24/11 1001 05/24/11 0305 05/23/11 1849  HGB -- -- -- 11.2*  HCT -- -- -- 35.9*  PLT -- -- -- 262  APTT -- -- -- --  LABPROT 17.0* -- -- --  INR 1.36 -- -- --  HEPARINUNFRC -- -- -- --  CREATININE 1.60* -- -- 2.57*  CKTOTAL -- 35 28 --  CKMB -- 1.8 -- --  TROPONINI -- <0.30 -- <0.30   Estimated Creatinine Clearance: 19.4 ml/min (by C-G formula based on Cr of 1.6).  Medical History: Past Medical History  Diagnosis Date  . Hypertension   . Arthritis   . Shortness of breath   . Stroke   . GERD (gastroesophageal reflux disease)   . Dysrhythmia   . Pneumonia   . Depression   . Anemia    Medications:  Scheduled:    . acetaminophen      . albuterol  5 mg Nebulization Once  . amLODipine  5 mg Oral Daily  . calcium gluconate  1 g Intravenous Once  . calcium gluconate  1 g Intravenous Once  . dextrose  50 mL Intravenous Once  . furosemide  40 mg Intravenous Once  . insulin aspart  10 Units Intravenous Once  . LORazepam  0.5 mg Oral QHS  . metoprolol tartrate  25 mg Oral BID  . rosuvastatin  5 mg Oral Daily  . sodium polystyrene  30 g Oral Once  . sodium polystyrene  30 g Rectal Q4H  . sodium polystyrene  60 g Oral Once  . sodium polystyrene      . warfarin  5 mg Oral ONCE-1800  . DISCONTD: calcium gluconate 10g/L infusion  1 g Intravenous Once  . DISCONTD: sodium polystyrene  30 g Oral Q3H  . DISCONTD: sodium polystyrene  30 g Oral Q3H    Assessment: INR below goal  Goal of Therapy:  INR 2-3   Plan: Coumadin 5mg  today x 1 INR daily until  stable  Amardeep Beckers A 05/24/2011,11:37 AM
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
## 2011-05-24 NOTE — Progress Notes (Signed)
Pt's daughters spoke with CSW in hallway.  They are concerned that pt is taking too much medication.  CSW encouraged them to discuss with MD.  CSW also talked with MD about situation and Dr. Janna Arch does not feel that there is any need for CSW intervention at this time as pt is able to make own decisions and has not been interested in placement.   Sheri Matthews

## 2011-05-24 NOTE — Progress Notes (Signed)
Patients potassium elevated this am; notified consult for nephrology, Dr. Wolfgang Phoenix, who ordered one time dose of Lasix, calcium gluconate, and rectal Kayaxlate. Pt is alert and oriented, explained the reasons behind the medications. Will continue to monitor pt.

## 2011-05-25 LAB — BASIC METABOLIC PANEL
GFR calc Af Amer: 47 mL/min — ABNORMAL LOW (ref >90)
GFR calc non Af Amer: 41 mL/min — ABNORMAL LOW (ref >90)
Glucose, Bld: 89 mg/dL (ref 70–99)
Potassium: 3.7 mEq/L (ref 3.5–5.1)
Sodium: 142 mEq/L (ref 135–145)

## 2011-05-25 LAB — VITAMIN D 25 HYDROXY (VIT D DEFICIENCY, FRACTURES): Vit D, 25-Hydroxy: 16 ng/mL — ABNORMAL LOW (ref 30–89)

## 2011-05-25 MED ORDER — ALUM & MAG HYDROXIDE-SIMETH 200-200-20 MG/5ML PO SUSP
30.0000 mL | ORAL | Status: DC | PRN
  Administered 2011-05-25: 30 mL via ORAL
  Filled 2011-05-25: qty 30

## 2011-05-25 MED ORDER — DILTIAZEM HCL 60 MG PO TABS
60.0000 mg | ORAL_TABLET | Freq: Four times a day (QID) | ORAL | Status: DC
  Administered 2011-05-25 – 2011-05-26 (×3): 60 mg via ORAL
  Filled 2011-05-25 (×3): qty 1

## 2011-05-25 MED ORDER — WARFARIN SODIUM 5 MG PO TABS
5.0000 mg | ORAL_TABLET | Freq: Once | ORAL | Status: AC
  Administered 2011-05-25: 5 mg via ORAL
  Filled 2011-05-25: qty 1

## 2011-05-25 NOTE — Progress Notes (Signed)
UR Chart Review Completed  

## 2011-05-25 NOTE — Progress Notes (Signed)
Patients heartrate maintaining in 150s and 160s, notified Dr. Fransico Him who ordered Cardizem 60mg  every 6 hours, first dose given. Will continue to monitor patient, who is asymptomatic at this time.

## 2011-05-25 NOTE — Progress Notes (Signed)
NAME:  Sheri Matthews, Sheri Matthews NO.:  [id number]  MEDICAL RECORD NO.:  [id number]  LOCATION:  A336                          FACILITY:  [facility]  PHYSICIAN:  Melvyn Novas, MDDATE OF BIRTH:  [birth year]  DATE OF PROCEDURE: DATE OF DISCHARGE:                                PROGRESS NOTE   Patient admitted with a fall, possibly contributed by sedated medicines. Her Ativan was just given to her night now, no more opioid analgesia for compression fracture and chronic pain.  PHYSICAL EXAMINATION:  VITAL SIGNS:  Temperature 98.3, pulse 102 and regular, respiratory rate is 16, blood pressure 135/79, O2 sat is 99%. LUNGS:  Clear.  No rales, wheeze, or rhonchi. HEART:  Regular rhythm.  No murmurs, gallops, heaves, thrills, or rubs. NECK:  Shows no JVD.  Potassium is 3.7, creatinine improved to 1.18.  Renal function has improved.  Hemoglobin 11.2.  The patient is hemodynamically stable.  The plan right now is to monitor renal function and electrolytes. Consider discharge in 1-2 days, if patient continues to improve.     Melvyn Novas, MD     RMD/MEDQ  D:  05/25/2011  T:  05/25/2011  Job:  [id number]

## 2011-05-25 NOTE — Consult Note (Signed)
ANTICOAGULATION CONSULT NOTE   Pharmacy Consult for Warfarin Indication: atrial fibrillation  No Known Allergies  Patient Measurements: Height: 5\' 1"  (154.9 cm) Weight: 124 lb 1.9 oz (56.3 kg) IBW/kg (Calculated) : 47.8   Vital Signs: Temp: 98.3 F (36.8 C) (01/10 0537) Temp src: Oral (01/10 0537) BP: 135/79 mmHg (01/10 0537) Pulse Rate: 102  (01/10 0537)  Labs:  Basename 05/25/11 0458 05/24/11 1035 05/24/11 1001 05/24/11 0305 05/23/11 1849  HGB -- -- -- -- 11.2*  HCT -- -- -- -- 35.9*  PLT -- -- -- -- 262  APTT -- -- -- -- --  LABPROT 17.4* 17.0* -- -- --  INR 1.40 1.36 -- -- --  HEPARINUNFRC -- -- -- -- --  CREATININE 1.18* 1.60* -- -- 2.57*  CKTOTAL -- -- 35 28 --  CKMB -- -- 1.8 -- --  TROPONINI -- -- <0.30 -- <0.30   Estimated Creatinine Clearance: 26.3 ml/min (by C-G formula based on Cr of 1.18).  Medical History: Past Medical History  Diagnosis Date  . Hypertension   . Arthritis   . Shortness of breath   . Stroke   . GERD (gastroesophageal reflux disease)   . Dysrhythmia   . Pneumonia   . Depression   . Anemia    Medications:  Scheduled:     . acetaminophen      . amLODipine  5 mg Oral Daily  . LORazepam  0.5 mg Oral QHS  . metoprolol tartrate  25 mg Oral BID  . rosuvastatin  5 mg Oral Daily  . sodium polystyrene  30 g Oral Once  . sodium polystyrene  30 g Rectal Q4H  . sodium polystyrene  60 g Oral Once  . warfarin  5 mg Oral ONCE-1800  . warfarin  5 mg Oral ONCE-1800    Assessment: INR below goal  Goal of Therapy:  INR 2-3   Plan: Coumadin 5mg  today  INR daily until stable  Ascencion Coye A 05/25/2011,10:39 AM

## 2011-05-25 NOTE — Progress Notes (Signed)
Subjective: Interval History: has no complaint of nausea or vomiting. Patient says that she's feeling a lot  Better.. She denies any difficulty in breathing.   Objective: Vital signs in last 24 hours: Temp:  [98.3 F (36.8 C)-99.1 F (37.3 C)] 98.3 F (36.8 C) (01/10 0537) Pulse Rate:  [78-102] 102  (01/10 0537) Resp:  [16-20] 16  (01/10 0537) BP: (126-135)/(79-85) 135/79 mmHg (01/10 0537) SpO2:  [93 %-99 %] 99 % (01/10 0537) Weight:  [56.3 kg (124 lb 1.9 oz)] 56.3 kg (124 lb 1.9 oz) (01/10 0537) Weight change: -4.082 kg (-9 lb)  Intake/Output from previous day: 01/09 0701 - 01/10 0700 In: 240 [P.O.:240] Out: 600 [Urine:600] Intake/Output this shift:    General appearance: alert and no distress Resp: clear to auscultation bilaterally Cardio: regular rate and rhythm, S1, S2 normal, no murmur, click, rub or gallop GI: soft, non-tender; bowel sounds normal; no masses,  no organomegaly Extremities: extremities normal, atraumatic, no cyanosis or edema  Lab Results:  [facility] 05/23/11 1849  WBC 10.5  HGB 11.2*  HCT 35.9*  PLT 262   BMET:  Basename 05/25/11 0458 05/24/11 1035  NA 142 143  K 3.7 4.1  CL 109 109  CO2 26 26  GLUCOSE 89 96  BUN 13 19  CREATININE 1.18* 1.60*  CALCIUM 9.1 10.5   No results found for this basename: PTH:2 in the last 72 hours Iron Studies: No results found for this basename: IRON,TIBC,TRANSFERRIN,FERRITIN in the last 72 hours  Studies/Results: Dg Chest Port 1 View  05/23/2011  *RADIOLOGY REPORT*  Clinical Data: Bradycardia, weakness, fall  PORTABLE CHEST - 1 VIEW  Comparison: 04/06/2011  Findings: Defibrillator pad over the chest.  Mild cardiac enlargement and vascular congestion.  Negative for CHF, definite pneumonia, collapse, consolidation, effusion or pneumothorax. Atherosclerosis of the aorta.  IMPRESSION: Cardiomegaly without CHF or pneumonia  Original Report Authenticated By: Judie Petit. Ruel Favors, M.D.    I have reviewed the patient's  current medications.  Assessment/Plan: Problem #1 renal failure this is acute kidney injury possibly a compression of ACE inhibitor, dehydration presently her BUN and creatinine has improved. Her creatinine is 1.18. Problem #2 hyperkalemia potassium has normalized Problem #3 history of anemia mild possibly secondary to iron deficiency anemia. Problem #4 hypertension her blood pressure seems to be controlled very well. Problem #5 history of hypercholesterolemia  Problem #6 history of CVA Problem #7 history of cardiac ischemia. Recommendation: We'll DC IV fluid  We'll continue his other treatments Since her renal function has improved her Sign-Off.   LOS: 2 days   Evangelene Vora S 05/25/2011,9:30 AM

## 2011-05-25 NOTE — Progress Notes (Signed)
CARE MANAGEMENT NOTE 05/25/2011  Patient:  Sheri Matthews, Sheri Matthews   Account Number:  [id number]  Date Initiated:  05/25/2011  Documentation initiated by:  Rosemary Holms  Subjective/Objective Assessment:   Pt admitted with Bradycardia. Lived at home alon but states her daughters live close and always there to assist her.     Action/Plan:   Pt plans to dc to home. No HH need identified   Anticipated DC Date:  05/25/2011   Anticipated DC Plan:  HOME/SELF CARE      DC Planning Services  CM consult      Choice offered to / List presented to:             Status of service:  In process, will continue to follow Medicare Important Message given?   (If response is "NO", the following Medicare IM given date fields will be blank) Date Medicare IM given:   Date Additional Medicare IM given:    Discharge Disposition:    Per UR Regulation:    Comments:  05/25/11/1300 Rosemary Holms RN BSN

## 2011-05-25 NOTE — Progress Notes (Signed)
[id number] 

## 2011-05-26 LAB — PTH, INTACT AND CALCIUM
Calcium, Total (PTH): 8.4 mg/dL (ref 8.4–10.5)
PTH: 83.5 pg/mL — ABNORMAL HIGH (ref 14.0–72.0)

## 2011-05-26 LAB — BASIC METABOLIC PANEL
BUN: 9 mg/dL (ref 6–23)
Calcium: 9.3 mg/dL (ref 8.4–10.5)
GFR calc non Af Amer: 50 mL/min — ABNORMAL LOW (ref >90)
Glucose, Bld: 87 mg/dL (ref 70–99)

## 2011-05-26 MED ORDER — POTASSIUM CHLORIDE CRYS ER 20 MEQ PO TBCR: 40.0000 meq | EXTENDED_RELEASE_TABLET | Freq: Once | ORAL | Status: DC

## 2011-05-26 MED ORDER — POTASSIUM CHLORIDE 10 MEQ/100ML IV SOLN
10.0000 meq | INTRAVENOUS | Status: AC
  Administered 2011-05-26 (×2): 10 meq via INTRAVENOUS
  Filled 2011-05-26: qty 200

## 2011-05-26 MED ORDER — SODIUM CHLORIDE 0.9 % IJ SOLN
INTRAMUSCULAR | Status: AC
  Filled 2011-05-26: qty 3

## 2011-05-26 MED ORDER — WARFARIN SODIUM 5 MG PO TABS: 5.0000 mg | ORAL_TABLET | Freq: Once | ORAL | Status: DC

## 2011-05-26 MED ORDER — POTASSIUM CHLORIDE CRYS ER 20 MEQ PO TBCR
40.0000 meq | EXTENDED_RELEASE_TABLET | Freq: Two times a day (BID) | ORAL | Status: DC
  Administered 2011-05-26: 40 meq via ORAL
  Filled 2011-05-26: qty 2

## 2011-05-26 NOTE — Consult Note (Signed)
ANTICOAGULATION CONSULT NOTE   Pharmacy Consult for Warfarin Indication: atrial fibrillation  No Known Allergies  Patient Measurements: Height: 5\' 1"  (154.9 cm) Weight: 124 lb 1.9 oz (56.3 kg) IBW/kg (Calculated) : 47.8   Vital Signs: Temp: 97.5 F (36.4 C) (01/11 0616) Temp src: Oral (01/11 0616) BP: 153/80 mmHg (01/11 0616) Pulse Rate: 102  (01/11 0616)  Labs:  Basename 05/26/11 0448 05/25/11 0458 05/24/11 1035 05/24/11 1001 05/24/11 0305 05/23/11 1849  HGB -- -- -- -- -- 11.2*  HCT -- -- -- -- -- 35.9*  PLT -- -- -- -- -- 262  APTT -- -- -- -- -- --  LABPROT 20.7* 17.4* 17.0* -- -- --  INR 1.74* 1.40 1.36 -- -- --  HEPARINUNFRC -- -- -- -- -- --  CREATININE 1.00 1.18* 1.60* -- -- --  CKTOTAL -- -- -- 35 28 --  CKMB -- -- -- 1.8 -- --  TROPONINI -- -- -- <0.30 -- <0.30   Estimated Creatinine Clearance: 31 ml/min (by C-G formula based on Cr of 1).  Medical History: Past Medical History  Diagnosis Date  . Hypertension   . Arthritis   . Shortness of breath   . Stroke   . GERD (gastroesophageal reflux disease)   . Dysrhythmia   . Pneumonia   . Depression   . Anemia    Medications:  Scheduled:     . amLODipine  5 mg Oral Daily  . diltiazem  60 mg Oral Q6H  . LORazepam  0.5 mg Oral QHS  . metoprolol tartrate  25 mg Oral BID  . rosuvastatin  5 mg Oral Daily  . sodium polystyrene  30 g Oral Once  . sodium polystyrene  60 g Oral Once  . warfarin  5 mg Oral ONCE-1800    Assessment: INR below goal  Goal of Therapy:  INR 2-3   Plan: Coumadin 5mg  today  INR daily until stable  [place], [place] J 05/26/2011,8:14 AM

## 2011-05-26 NOTE — Progress Notes (Signed)
Subjective: Interval History: has complaints . Any nausea vomiting. She denies also any difficulty increasing..  Objective: Vital signs in last 24 hours: Temp:  [97.5 F (36.4 C)-99.2 F (37.3 C)] 97.5 F (36.4 C) (01/11 0616) Pulse Rate:  [91-132] 102  (01/11 0616) Resp:  [18] 18  (01/11 0616) BP: (121-153)/(62-84) 153/80 mmHg (01/11 0616) SpO2:  [96 %-99 %] 98 % (01/11 0616) Weight change:   Intake/Output from previous day: 01/10 0701 - 01/11 0700 In: 120 [P.O.:120] Out: -  Intake/Output this shift:    General appearance: alert, cooperative and no distress Resp: clear to auscultation bilaterally Cardio: regular rate and rhythm, S1, S2 normal, no murmur, click, rub or gallop GI: soft, non-tender; bowel sounds normal; no masses,  no organomegaly Extremities: extremities normal, atraumatic, no cyanosis or edema  Lab Results:  [facility] 05/23/11 1849  WBC 10.5  HGB 11.2*  HCT 35.9*  PLT 262   BMET:  Basename 05/26/11 0448 05/25/11 0458  NA 142 142  K 3.0* 3.7  CL 105 109  CO2 30 26  GLUCOSE 87 89  BUN 9 13  CREATININE 1.00 1.18*  CALCIUM 9.3 9.1   No results found for this basename: PTH:2 in the last 72 hours Iron Studies: No results found for this basename: IRON,TIBC,TRANSFERRIN,FERRITIN in the last 72 hours  Studies/Results: No results found.  I have reviewed the patient's current medications.  Assessment/Plan: Problem #1 acute kidney injury BUN is 9 creatinine 1 renal function has recovered. Problem #2 hypokalemia potassium is 3 that has declined. Previously patient was on potassium supplement develop hyperkalemia that was discontinued. Problem #3 history of hypertension blood pressure seems to be reasonably controlled Problem #4 history of anemia mild her H&H is stable Problem #5 history of hypercholesterolemia Problem #6 history of poor appetite etiology not clear as this moment Problem #7 history of peptic ulcer disease Problem #8 history of  bradycardia Recommendation we'll give her KCl 10 mEq IV one dose We'll put her on KCL 40 mEq by mouth twice a day Since her renal function has corrected a will sign her off in once her potassium improves closer potassium we need to be adjusted.    LOS: 3 days   Paxten Appelt S 05/26/2011,8:35 AM

## 2011-05-26 NOTE — Discharge Planning (Signed)
Patient discharged home accompanied by two daughters.  All discharge information was reviewed with patient and family.  All questions and concerns were addressed.  Patient has F/U appt with Dr. Janna Arch 05/31/2011 and is aware.  Pt in stable condition.

## 2011-05-27 NOTE — Discharge Summary (Signed)
NAME:  Sheri Matthews, Sheri Matthews NO.:  [id number]  MEDICAL RECORD NO.:  [id number]  LOCATION:  A336                          FACILITY:  [facility]  PHYSICIAN:  Melvyn Novas, MDDATE OF BIRTH:  August 03, 1923  DATE OF ADMISSION:  05/23/2011 DATE OF DISCHARGE:  01/11/2013LH                              DISCHARGE SUMMARY   The patient is an 78 year old white female who lives at home alone and her daughters live adjacent, has history of paroxysmal AFib, currently in sinus rhythm, history of hypertension, chronic noncompliance, GERD, hyperlipidemia, anticoagulation on Coumadin currently, and degenerative joint disease, degenerative disk disease.  The patient has had 2 episodes of fall in the last 6 weeks, so she takes Ativan at night for insomnia and also takes Percocet, over indulges, she was supposed to take 5 mg p.o. t.i.d. p.r.n.  She took 17 pills in 2-3 days according to the family.  She was admitted with another fall.  There was no evidence of break.  She also had acute renal failure.  Creatinine had jumped from normal values to over 3 with hyperkalemias, potassium greater than 7.5. She was given calcium, insulin, and glucose, and then subsequently, definitive therapy in the form of Kayexalate 30 g in enema form, subsequent normalization of potassium throughout her hospital stay. Potassium supplement was discontinued.  She had normal kalemia throughout her hospital stay.  She was hemodynamically stable.  Blood pressure was 135/72.  She was afebrile.  Creatinine with aggressive IV fluid rehydration returned to normal with 1.1, however, on the day of hospital discharge, her potassium dipped down to 3.0.  She does seem to require potassium routinely.  It was elected to continue anticoagulation to stop all opioid analgesia as well as Ativan for sleep and see if this improves her frequency if any falling.  DISCHARGE MEDICINES: 1. Norvasc 5 mg p.o. daily. 2. Crestor 10 mg  p.o. daily. 3. Proventil 2 puffs q.i.d. 4. Benazepril 5 mg p.o. daily. 5. Vitamin D 500/200 with calcium p.o. daily. 6. Diltiazem 180 mg p.o. daily. 7. Aricept 5 mg p.o. daily. 8. Lopressor 50 mg p.o. daily. 9. Naprosyn 500 mg p.o. b.i.d. 10.Protonix 40 p.o. daily. 11.Potassium chloride 10 mEq p.o. daily. 12.Warfarin 2.5 mg p.o. daily. 13.Discontinue amlodipine 5 mg.  She will follow up in the office in 3-5 days' time for assessment of renal function as well as potassium.     Melvyn Novas, MD     RMD/MEDQ  D:  05/26/2011  T:  05/27/2011  Job:  [id number]

## 2011-10-23 ENCOUNTER — Encounter (HOSPITAL_COMMUNITY): Payer: Self-pay

## 2011-10-23 ENCOUNTER — Emergency Department (HOSPITAL_COMMUNITY)
Admission: EM | Admit: 2011-10-23 | Discharge: 2011-10-23 | Disposition: A | Discharge status: Home / Self Care | Payer: Medicare Other | Attending: Emergency Medicine | Admitting: Emergency Medicine

## 2011-10-23 ENCOUNTER — Emergency Department (HOSPITAL_COMMUNITY): Payer: Medicare Other

## 2011-10-23 DIAGNOSIS — R791 Abnormal coagulation profile: Secondary | ICD-10-CM

## 2011-10-23 DIAGNOSIS — I10 Essential (primary) hypertension: Secondary | ICD-10-CM | POA: Insufficient documentation

## 2011-10-23 DIAGNOSIS — Z7901 Long term (current) use of anticoagulants: Secondary | ICD-10-CM | POA: Insufficient documentation

## 2011-10-23 DIAGNOSIS — M129 Arthropathy, unspecified: Secondary | ICD-10-CM | POA: Insufficient documentation

## 2011-10-23 DIAGNOSIS — K219 Gastro-esophageal reflux disease without esophagitis: Secondary | ICD-10-CM | POA: Insufficient documentation

## 2011-10-23 DIAGNOSIS — Z79899 Other long term (current) drug therapy: Secondary | ICD-10-CM | POA: Insufficient documentation

## 2011-10-23 DIAGNOSIS — Z8673 Personal history of transient ischemic attack (TIA), and cerebral infarction without residual deficits: Secondary | ICD-10-CM | POA: Insufficient documentation

## 2011-10-23 DIAGNOSIS — F329 Major depressive disorder, single episode, unspecified: Secondary | ICD-10-CM | POA: Insufficient documentation

## 2011-10-23 DIAGNOSIS — F3289 Other specified depressive episodes: Secondary | ICD-10-CM | POA: Insufficient documentation

## 2011-10-23 DIAGNOSIS — M109 Gout, unspecified: Secondary | ICD-10-CM

## 2011-10-23 DIAGNOSIS — S82899A Other fracture of unspecified lower leg, initial encounter for closed fracture: Secondary | ICD-10-CM

## 2011-10-23 LAB — URIC ACID: Uric Acid, Serum: 9.2 mg/dL — ABNORMAL HIGH (ref 2.4–7.0)

## 2011-10-23 LAB — DIFFERENTIAL
Basophils Absolute: 0 10*3/uL (ref 0.0–0.1)
Basophils Relative: 0 % (ref 0–1)
Monocytes Absolute: 1.1 10*3/uL — ABNORMAL HIGH (ref 0.1–1.0)
Neutro Abs: 9.4 10*3/uL — ABNORMAL HIGH (ref 1.7–7.7)
Neutrophils Relative %: 79 % — ABNORMAL HIGH (ref 43–77)

## 2011-10-23 LAB — PROTIME-INR
INR: 5.4 (ref 0.00–1.49)
Prothrombin Time: 50 seconds — ABNORMAL HIGH (ref 11.6–15.2)

## 2011-10-23 LAB — CBC
MCHC: 32.5 g/dL (ref 30.0–36.0)
Platelets: 338 10*3/uL (ref 150–400)
RDW: 12.5 % (ref 11.5–15.5)

## 2011-10-23 MED ORDER — OXYCODONE-ACETAMINOPHEN 5-325 MG PO TABS
2.0000 | ORAL_TABLET | Freq: Once | ORAL | Status: AC
  Administered 2011-10-23: 2 via ORAL
  Filled 2011-10-23: qty 2

## 2011-10-23 MED ORDER — COLCHICINE 0.6 MG PO TABS: 0.6000 mg | ORAL_TABLET | Freq: Every day | ORAL | Status: DC

## 2011-10-23 MED ORDER — OXYCODONE-ACETAMINOPHEN 5-325 MG PO TABS: 2.0000 | ORAL_TABLET | ORAL | Status: AC | PRN

## 2011-10-23 NOTE — ED Notes (Signed)
Patient is being discharged at this time.

## 2011-10-23 NOTE — ED Provider Notes (Signed)
History  This chart was scribed for Toy Baker, MD by Bennett Scrape. This patient was seen in room APA01/APA01 and the patient's care was started at 10:58AM.  CSN: [id number]  Arrival date & time 10/23/11  1040   First MD Initiated Contact with Patient 10/23/11 1055      Chief Complaint  Patient presents with  . Gout    The history is provided by the patient. No language interpreter was used.    Sheri Matthews is a 78 y.o. female who presents to the Emergency Department complaining of 2 weeks of sudden onset, gradually worsening, constant left foot and ankle pain described as a throbbing sensation with associated swelling that started after a fall with the pain becoming more severe 5 days ago. She states that she was not evaluated after the fall. She reports that the pain is worse at night and with walking. She has not taken any OTC medications to improve her symptoms. She has not seen her PCP for the symptoms. She denies fever, emesis and recent illness as associated symptoms.. She denies having a h/o gout. She has a h/o HTN, arthritis, CVA, GERD and depression. She is currently on coumadin. She denies smoking and alcohol use.   Dr. Juanetta Gosling is PCP.  Past Medical History  Diagnosis Date  . Hypertension   . Arthritis   . Shortness of breath   . Stroke   . GERD (gastroesophageal reflux disease)   . Dysrhythmia   . Pneumonia   . Depression   . Anemia     Past Surgical History  Procedure Date  . Back surgery   . Ovaries removed    No family history on file.  History  Substance Use Topics  . Smoking status: Never Smoker   . Smokeless tobacco: Never Used  . Alcohol Use: No     Review of Systems  Constitutional: Negative for fever.       10 Systems reviewed and are negative for acute change except as noted in the HPI.  HENT: Negative for rhinorrhea.   Eyes: Negative for discharge and redness.  Respiratory: Negative for cough and shortness of breath.     Cardiovascular: Negative for chest pain.  Gastrointestinal: Negative for vomiting, abdominal pain and diarrhea.  Genitourinary: Negative for dysuria.  Musculoskeletal: Negative for back pain.       Left foot, ankle and leg pain and swelling  Skin: Negative for rash.  Neurological: Negative for weakness and headaches.    Allergies  Review of patient's allergies indicates no known allergies.  Home Medications   Current Outpatient Rx  Name Route Sig Dispense Refill  . AMITRIPTYLINE HCL 25 MG PO TABS Oral Take 25 mg by mouth at bedtime.    Marland Kitchen BENAZEPRIL HCL 5 MG PO TABS Oral Take 1 tablet (5 mg total) by mouth daily. 30 tablet 2  . BUSPIRONE HCL 5 MG PO TABS Oral Take 5 mg by mouth 2 (two) times daily.    Marland Kitchen DILTIAZEM HCL ER COATED BEADS 180 MG PO CP24 Oral Take 1 capsule (180 mg total) by mouth daily. 30 capsule 2  . DONEPEZIL HCL 5 MG PO TABS Oral Take 5 mg by mouth at bedtime.     Marland Kitchen METOPROLOL TARTRATE 50 MG PO TABS Oral Take 50 mg by mouth 2 (two) times daily.      Marland Kitchen OMEPRAZOLE 20 MG PO CPDR Oral Take 20 mg by mouth daily.    Marland Kitchen SIMVASTATIN 20 MG PO TABS  Oral Take 20 mg by mouth every evening.    . WARFARIN SODIUM 3 MG PO TABS Oral Take 3 mg by mouth daily.      Triage Vitals: BP 172/61  Pulse 75  Temp(Src) 99.1 F (37.3 C) (Oral)  Resp 18  SpO2 97%  Physical Exam  Nursing note and vitals reviewed. Constitutional: She is oriented to person, place, and time. She appears well-developed and well-nourished. No distress.  HENT:  Head: Normocephalic and atraumatic.  Eyes: Conjunctivae and EOM are normal.  Neck: Neck supple. No tracheal deviation present.  Cardiovascular: Normal rate.   Pulmonary/Chest: Effort normal. No respiratory distress.  Abdominal: She exhibits no distension.  Musculoskeletal: Normal range of motion.       Erythema on the dorsum of the left foot and shin, warm to the touch, no crepitus, full ROM, pain with palpation to left ankle and MCP joint   Neurological: She is alert and oriented to person, place, and time. No sensory deficit.  Skin: Skin is warm and dry.  Psychiatric: She has a normal mood and affect. Her behavior is normal.    ED Course  Procedures (including critical care time)  DIAGNOSTIC STUDIES: Oxygen Saturation is 97% on room air, normal by my interpretation.    COORDINATION OF CARE: 11:02AM-Discussed treatment plan which includes x-ray and blood work with pt and pt agreed to plan.   Labs Reviewed  URIC ACID - Abnormal; Notable for the following:    Uric Acid, Serum 9.2 (*)    All other components within normal limits  PROTIME-INR - Abnormal; Notable for the following:    Prothrombin Time 50.0 (*) RESULT REPEATED AND VERIFIED   INR 5.40 (*)    All other components within normal limits  CBC - Abnormal; Notable for the following:    WBC 12.0 (*)    All other components within normal limits  DIFFERENTIAL - Abnormal; Notable for the following:    Neutrophils Relative 79 (*)    Neutro Abs 9.4 (*)    Monocytes Absolute 1.1 (*)    All other components within normal limits   Dg Ankle Complete Left  10/23/2011  *RADIOLOGY REPORT*  Clinical Data: Pain and swelling.  LEFT ANKLE COMPLETE - 3+ VIEW  Comparison: None.  Findings: There is mild diffuse soft tissue swelling.  A tiny osseous fragment is seen adjacent to the inferior lateral malleolus.  IMPRESSION: Difficult to exclude a tiny avulsion fracture off the inferior lateral malleolus.  Diffuse soft tissue swelling.  Original Report Authenticated By: Reyes Ivan, M.D.   Dg Foot Complete Left  10/23/2011  *RADIOLOGY REPORT*  Clinical Data: Pain and swelling.  LEFT FOOT - COMPLETE 3+ VIEW  Comparison: 09/15/2010.  Findings: There is mild soft tissue swelling.  No acute osseous abnormality.  A tiny curvilinear density overlying the proximal midfoot on the lateral view is unchanged. Mild first metatarsal phalangeal joint osteoarthritis.  IMPRESSION: Soft tissue  air swelling without acute finding.  Original Report Authenticated By: Reyes Ivan, M.D.     No diagnosis found.    MDM  1:33 PM Patient given ASO splint. She was also given referral to orthopedics on call. Given pain medication well. Will be treated for her possible small evulsion fracture to her ankle as well as suspected gout      I personally performed the services described in this documentation, which was scribed in my presence. The recorded information has been reviewed and considered. \   Toy Baker,  MD 10/23/11 1334

## 2011-10-23 NOTE — Discharge Instructions (Signed)
Your INR today was 5.4. Do not take your next dose of Coumadin. Call Dr. Juanetta Gosling today to schedule a followup visit to discuss how to dose your Coumadin Ankle Fracture A fracture is a break in the bone. Most of the time, surgery is not needed for a broken ankle. A cast, splint, ankle brace, or walking boot may be used to heal the break. A broken ankle can heal in 6 to 12 weeks. HOME CARE  Do not walk on your broken ankle until told by your doctor.   Use crutches as told by your doctor.   Keep your ankle raised (elevated) to the level of the chest as much as possible.   Take medicine as told by your doctor.   Do not smoke.   Eat healthy foods.   Get follow-up care as told by your doctor.   If you do not need a cast, splint, brace, or boot:   Apply an ice pack to the ankle as told by your doctor.   If you have an ankle brace or walking boot:   Only remove it as told by your doctor.   Apply an ice pack to the ankle as told by your doctor.   If you have a splint or cast:   Rest your plaster splint or cast only on a pillow until it is fully hardened. It may take 3 days for the plaster to harden.   Do not scratch under the splint or cast.   Do not stick anything inside the splint or cast to scratch your skin.   Keep sand and dirt out of the inside of the splint or cast.   Do not pull out the padding from the splint or cast.   Keep your splint or cast dry and clean. Cover it with a plastic bag during showers.   Check the skin around the splint or cast every day. You may put lotion on sore areas.   Do not put pressure on any part of your cast or splint. It may break.  GET HELP RIGHT AWAY IF:   You have severe pain, burning or stinging.   Your toes turn blue or gray.   Your toes feel cold or numb.   You cannot move your toes.   Your splint or cast is too tight.   Your splint or cast breaks.   There is a bad smell or yellowish white fluid (pus) coming from under the  splint or cast.  MAKE SURE YOU:   Understand these instructions.   Will watch your condition.   Will get help right away if you are not doing well or get worse.  Document Released: 02/26/2009 Document Revised: 04/20/2011 Document Reviewed: 03/03/2009 [facility] Patient Information 2012 [place], [place].Gout Gout is an inflammatory condition (arthritis) caused by a buildup of uric acid crystals in the joints. Uric acid is a chemical that is normally present in the blood. Under some circumstances, uric acid can form into crystals in your joints. This causes joint redness, soreness, and swelling (inflammation). Repeat attacks are common. Over time, uric acid crystals can form into masses (tophi) near a joint, causing disfigurement. Gout is treatable and often preventable. CAUSES  The disease begins with elevated levels of uric acid in the blood. Uric acid is produced by your body when it breaks down a naturally found substance called purines. This also happens when you eat certain foods such as meats and fish. Causes of an elevated uric acid level include:  Being  passed down from parent to child (heredity).   Diseases that cause increased uric acid production (obesity, psoriasis, some cancers).   Excessive alcohol use.   Diet, especially diets rich in meat and seafood.   Medicines, including certain cancer-fighting drugs (chemotherapy), diuretics, and aspirin.   Chronic kidney disease. The kidneys are no longer able to remove uric acid well.   Problems with metabolism.  Conditions strongly associated with gout include:  Obesity.   High blood pressure.   High cholesterol.   Diabetes.  Not everyone with elevated uric acid levels gets gout. It is not understood why some people get gout and others do not. Surgery, joint injury, and eating too much of certain foods are some of the factors that can lead to gout. SYMPTOMS   An attack of gout comes on quickly. It causes intense pain with  redness, swelling, and warmth in a joint.   Fever can occur.   Often, only one joint is involved. Certain joints are more commonly involved:   Base of the big toe.   Knee.   Ankle.   Wrist.   Finger.  Without treatment, an attack usually goes away in a few days to weeks. Between attacks, you usually will not have symptoms, which is different from many other forms of arthritis. DIAGNOSIS  Your caregiver will suspect gout based on your symptoms and exam. Removal of fluid from the joint (arthrocentesis) is done to check for uric acid crystals. Your caregiver will give you a medicine that numbs the area (local anesthetic) and use a needle to remove joint fluid for exam. Gout is confirmed when uric acid crystals are seen in joint fluid, using a special microscope. Sometimes, blood, urine, and X-ray tests are also used. TREATMENT  There are 2 phases to gout treatment: treating the sudden onset (acute) attack and preventing attacks (prophylaxis). Treatment of an Acute Attack  Medicines are used. These include anti-inflammatory medicines or steroid medicines.   An injection of steroid medicine into the affected joint is sometimes necessary.   The painful joint is rested. Movement can worsen the arthritis.   You may use warm or cold treatments on painful joints, depending which works best for you.   Discuss the use of coffee, vitamin C, or cherries with your caregiver. These may be helpful treatment options.  Treatment to Prevent Attacks After the acute attack subsides, your caregiver may advise prophylactic medicine. These medicines either help your kidneys eliminate uric acid from your body or decrease your uric acid production. You may need to stay on these medicines for a very long time. The early phase of treatment with prophylactic medicine can be associated with an increase in acute gout attacks. For this reason, during the first few months of treatment, your caregiver may also advise  you to take medicines usually used for acute gout treatment. Be sure you understand your caregiver's directions. You should also discuss dietary treatment with your caregiver. Certain foods such as meats and fish can increase uric acid levels. Other foods such as dairy can decrease levels. Your caregiver can give you a list of foods to avoid. HOME CARE INSTRUCTIONS   Do not take aspirin to relieve pain. This raises uric acid levels.   Only take over-the-counter or prescription medicines for pain, discomfort, or fever as directed by your caregiver.   Rest the joint as much as possible. When in bed, keep sheets and blankets off painful areas.   Keep the affected joint raised (elevated).  Use crutches if the painful joint is in your leg.   Drink enough water and fluids to keep your urine clear or pale yellow. This helps your body get rid of uric acid. Do not drink alcoholic beverages. They slow the passage of uric acid.   Follow your caregiver's dietary instructions. Pay careful attention to the amount of protein you eat. Your daily diet should emphasize fruits, vegetables, whole grains, and fat-free or low-fat milk products.   Maintain a healthy body weight.  SEEK MEDICAL CARE IF:   You have an oral temperature above 102 F (38.9 C).   You develop diarrhea, vomiting, or any side effects from medicines.   You do not feel better in 24 hours, or you are getting worse.  SEEK IMMEDIATE MEDICAL CARE IF:   Your joint becomes suddenly more tender and you have:   Chills.   An oral temperature above 102 F (38.9 C), not controlled by medicine.  MAKE SURE YOU:   Understand these instructions.   Will watch your condition.   Will get help right away if you are not doing well or get worse.  Document Released: 04/28/2000 Document Revised: 04/20/2011 Document Reviewed: 08/09/2009 [employer] Patient Information 2012 [employer], LLCoagulopathy Coagulopathy (bleeding disorder) is an abnormal  condition that makes it difficult for blood to clot. Platelets (specialized blood cells) and clotting factors (special blood proteins) combine to help the blood clot when a cut has been made in the skin. If platelets and clotting factors are not present in sufficient numbers, clots cannot form properly to stop bleeding.  CAUSES  Bleeding disorders usually have two causes:  Acquired bleeding disorders are due to a disease process or are drug induced, such as:   Severe liver disease.   Over treatment of Coumadin (Warfarin).   Drug induced Immune Thrombocytopenia. Heparin is a common cause.   Bone marrow disorders.   Lupus.   Prolonged use of antibiotics may cause vitamin K deficiency.   Congenital (Inherited) bleeding disorders.   Hemophilia.   Deficiencies of clotting Factors II, V, VII, IX, X.   Von Willebrands Disease. People with this disease are especially prone to bleeding if aspirin or NSAIDS (nonsteroidal anti-inflammatory drugs) products are taken.  SYMPTOMS  Whether the coagulopathy is acquired or congenital, symptoms are usually the same:   Unusual bleeding or bleeding very easily such as:   Nosebleeds.   Bloody stools or blood in the urine.   Abnormal or very heavy menstrual bleeding.   Cuts that bleed excessively.   Excessive bruising:   Bruising very easily.   Unexplained bruising.   Dizziness.  DIAGNOSIS  Coagulopathy is usually diagnosed by blood tests and a physical exam. This includes:  Clotting factor blood tests such as:   Partial Thromboplastin Time (PTT) and Prothrombin Time (PT). These tests show how long it takes your blood to clot. PTT and PT results may be high with a bleeding disorder.   Platelet counts. Platelets are a type of blood cell that help stop bleeding. The platelet count may be low with a bleeding disorder.   Fibrinogen levels. Fibrinogen is a protein produced by the liver. It helps blood to clot. The Fibrinogen level may be  low with bleeding disorder.   A physical exam may reveal bruising throughout the body.   Small petechiae (small pinpoint red or purple "dots"). Petechiae has a rash like appearance but does not itch. It may appear anywhere on the body, including the hands, feet, inside the mouth or  even in the whites of the eyes.  TREATMENT  Treatment depends on whether the coagulopathy is acquired or congenital.   If clotting factors are low (deficient), these may need to be replaced.   Platelet transfusions may need to be given.   If your caregiver has determined that a medication is the cause of the bleeding disorder, your caregiver may need to adjust or discontinue the medication.   Treatment of the underlying disease state to correct the coagulopathy.   If your Vitamin K level is low, it will need to be replaced.  COMPLICATIONS OF COAGULOPATHY A bleeding disorder can develop very serious complications such as:  Bleeding into the brain (Intracerebral bleeding).   Liver failure.   Kidney failure.   Gastrointestinal bleeding.  SEEK IMMEDIATE MEDICAL CARE IF:  You have large areas of bruising.   You have a severe headache that does not go away.   You have blood in your urine or stool.   You vomit blood.   You develop redness or rashes on your skin.   You become dizzy or pass out (loss of consciousness).   You develop chest pain or shortness of breath. If the chest pain or shortness of breath is severe, call your local emergency service immediately.  MAKE SURE YOU:   Understand these instructions.   Will watch your condition.   Will get help right away if you are not doing well or get worse.  Document Released: 02/19/2004 Document Revised: 04/20/2011 Document Reviewed: 06/11/2008 [facility] Patient Information 2012 [place], [place].C.Coagulopathy Coagulopathy (bleeding disorder) is an abnormal condition that makes it difficult for blood to clot. Platelets (specialized blood cells) and  clotting factors (special blood proteins) combine to help the blood clot when a cut has been made in the skin. If platelets and clotting factors are not present in sufficient numbers, clots cannot form properly to stop bleeding.  CAUSES  Bleeding disorders usually have two causes:  Acquired bleeding disorders are due to a disease process or are drug induced, such as:   Severe liver disease.   Over treatment of Coumadin (Warfarin).   Drug induced Immune Thrombocytopenia. Heparin is a common cause.   Bone marrow disorders.   Lupus.   Prolonged use of antibiotics may cause vitamin K deficiency.   Congenital (Inherited) bleeding disorders.   Hemophilia.   Deficiencies of clotting Factors II, V, VII, IX, X.   Von Willebrands Disease. People with this disease are especially prone to bleeding if aspirin or NSAIDS (nonsteroidal anti-inflammatory drugs) products are taken.  SYMPTOMS  Whether the coagulopathy is acquired or congenital, symptoms are usually the same:   Unusual bleeding or bleeding very easily such as:   Nosebleeds.   Bloody stools or blood in the urine.   Abnormal or very heavy menstrual bleeding.   Cuts that bleed excessively.   Excessive bruising:   Bruising very easily.   Unexplained bruising.   Dizziness.  DIAGNOSIS  Coagulopathy is usually diagnosed by blood tests and a physical exam. This includes:  Clotting factor blood tests such as:   Partial Thromboplastin Time (PTT) and Prothrombin Time (PT). These tests show how long it takes your blood to clot. PTT and PT results may be high with a bleeding disorder.   Platelet counts. Platelets are a type of blood cell that help stop bleeding. The platelet count may be low with a bleeding disorder.   Fibrinogen levels. Fibrinogen is a protein produced by the liver. It helps blood to clot. The Fibrinogen  level may be low with bleeding disorder.   A physical exam may reveal bruising throughout the body.    Small petechiae (small pinpoint red or purple "dots"). Petechiae has a rash like appearance but does not itch. It may appear anywhere on the body, including the hands, feet, inside the mouth or even in the whites of the eyes.  TREATMENT  Treatment depends on whether the coagulopathy is acquired or congenital.   If clotting factors are low (deficient), these may need to be replaced.   Platelet transfusions may need to be given.   If your caregiver has determined that a medication is the cause of the bleeding disorder, your caregiver may need to adjust or discontinue the medication.   Treatment of the underlying disease state to correct the coagulopathy.   If your Vitamin K level is low, it will need to be replaced.  COMPLICATIONS OF COAGULOPATHY A bleeding disorder can develop very serious complications such as:  Bleeding into the brain (Intracerebral bleeding).   Liver failure.   Kidney failure.   Gastrointestinal bleeding.  SEEK IMMEDIATE MEDICAL CARE IF:  You have large areas of bruising.   You have a severe headache that does not go away.   You have blood in your urine or stool.   You vomit blood.   You develop redness or rashes on your skin.   You become dizzy or pass out (loss of consciousness).   You develop chest pain or shortness of breath. If the chest pain or shortness of breath is severe, call your local emergency service immediately.  MAKE SURE YOU:   Understand these instructions.   Will watch your condition.   Will get help right away if you are not doing well or get worse.  Document Released: 02/19/2004 Document Revised: 04/20/2011 Document Reviewed: 06/11/2008 [facility] Patient Information 2012 [place], [place].

## 2011-10-23 NOTE — ED Notes (Signed)
Pt reports gout to her left foot, ankle, and leg for the past 5 days.  Pt has swelling to left foot and ankle.

## 2012-07-03 ENCOUNTER — Inpatient Hospital Stay (HOSPITAL_COMMUNITY)
Admission: EM | Admit: 2012-07-03 | Discharge: 2012-07-06 | DRG: 310 | Disposition: A | Discharge status: Home / Self Care | Payer: Medicare Other | Attending: Pulmonary Disease | Admitting: Pulmonary Disease

## 2012-07-03 ENCOUNTER — Encounter (HOSPITAL_COMMUNITY): Payer: Self-pay

## 2012-07-03 ENCOUNTER — Emergency Department (HOSPITAL_COMMUNITY): Payer: Medicare Other

## 2012-07-03 DIAGNOSIS — M129 Arthropathy, unspecified: Secondary | ICD-10-CM | POA: Diagnosis present

## 2012-07-03 DIAGNOSIS — Z23 Encounter for immunization: Secondary | ICD-10-CM

## 2012-07-03 DIAGNOSIS — K219 Gastro-esophageal reflux disease without esophagitis: Secondary | ICD-10-CM | POA: Diagnosis present

## 2012-07-03 DIAGNOSIS — R0789 Other chest pain: Secondary | ICD-10-CM

## 2012-07-03 DIAGNOSIS — M1990 Unspecified osteoarthritis, unspecified site: Secondary | ICD-10-CM | POA: Diagnosis present

## 2012-07-03 DIAGNOSIS — W19XXXA Unspecified fall, initial encounter: Secondary | ICD-10-CM | POA: Diagnosis present

## 2012-07-03 DIAGNOSIS — K279 Peptic ulcer, site unspecified, unspecified as acute or chronic, without hemorrhage or perforation: Secondary | ICD-10-CM | POA: Diagnosis present

## 2012-07-03 DIAGNOSIS — D649 Anemia, unspecified: Secondary | ICD-10-CM | POA: Diagnosis present

## 2012-07-03 DIAGNOSIS — Z8701 Personal history of pneumonia (recurrent): Secondary | ICD-10-CM

## 2012-07-03 DIAGNOSIS — F3289 Other specified depressive episodes: Secondary | ICD-10-CM | POA: Diagnosis present

## 2012-07-03 DIAGNOSIS — F329 Major depressive disorder, single episode, unspecified: Secondary | ICD-10-CM | POA: Diagnosis present

## 2012-07-03 DIAGNOSIS — R071 Chest pain on breathing: Secondary | ICD-10-CM | POA: Diagnosis present

## 2012-07-03 DIAGNOSIS — I4891 Unspecified atrial fibrillation: Secondary | ICD-10-CM | POA: Diagnosis present

## 2012-07-03 DIAGNOSIS — I10 Essential (primary) hypertension: Secondary | ICD-10-CM | POA: Diagnosis present

## 2012-07-03 DIAGNOSIS — Z8673 Personal history of transient ischemic attack (TIA), and cerebral infarction without residual deficits: Secondary | ICD-10-CM

## 2012-07-03 LAB — CBC WITH DIFFERENTIAL/PLATELET
Basophils Relative: 0 % (ref 0–1)
Hemoglobin: 13.5 g/dL (ref 12.0–15.0)
MCHC: 33.6 g/dL (ref 30.0–36.0)
Monocytes Relative: 7 % (ref 3–12)
Neutro Abs: 7.4 10*3/uL (ref 1.7–7.7)
Neutrophils Relative %: 77 % (ref 43–77)
RBC: 4.38 MIL/uL (ref 3.87–5.11)
WBC: 9.6 10*3/uL (ref 4.0–10.5)

## 2012-07-03 LAB — LIPASE, BLOOD: Lipase: 48 U/L (ref 11–59)

## 2012-07-03 LAB — COMPREHENSIVE METABOLIC PANEL
AST: 31 U/L (ref 0–37)
Albumin: 4 g/dL (ref 3.5–5.2)
Alkaline Phosphatase: 104 U/L (ref 39–117)
BUN: 23 mg/dL (ref 6–23)
Chloride: 101 mEq/L (ref 96–112)
Potassium: 3.8 mEq/L (ref 3.5–5.1)
Total Bilirubin: 0.6 mg/dL (ref 0.3–1.2)

## 2012-07-03 LAB — URINE MICROSCOPIC-ADD ON

## 2012-07-03 LAB — URINALYSIS, ROUTINE W REFLEX MICROSCOPIC
Glucose, UA: NEGATIVE mg/dL
Ketones, ur: 15 mg/dL — AB
pH: 6 (ref 5.0–8.0)

## 2012-07-03 LAB — PROTIME-INR: INR: 2.23 — ABNORMAL HIGH (ref 0.00–1.49)

## 2012-07-03 LAB — MRSA PCR SCREENING: MRSA by PCR: POSITIVE — AB

## 2012-07-03 MED ORDER — DILTIAZEM HCL ER COATED BEADS 180 MG PO CP24: 180.0000 mg | ORAL_CAPSULE | Freq: Every day | ORAL | Status: DC

## 2012-07-03 MED ORDER — ACETAMINOPHEN 325 MG PO TABS
650.0000 mg | ORAL_TABLET | Freq: Four times a day (QID) | ORAL | Status: DC | PRN
  Administered 2012-07-05 – 2012-07-06 (×2): 650 mg via ORAL
  Filled 2012-07-03 (×2): qty 2

## 2012-07-03 MED ORDER — DILTIAZEM HCL 25 MG/5ML IV SOLN
10.0000 mg | Freq: Once | INTRAVENOUS | Status: AC
  Administered 2012-07-03: 10 mg via INTRAVENOUS
  Filled 2012-07-03: qty 5

## 2012-07-03 MED ORDER — WARFARIN - PHARMACIST DOSING INPATIENT: Freq: Every day | Status: DC

## 2012-07-03 MED ORDER — SODIUM CHLORIDE 0.9 % IV SOLN
INTRAVENOUS | Status: AC
  Administered 2012-07-03: 18:00:00 via INTRAVENOUS

## 2012-07-03 MED ORDER — BUSPIRONE HCL 5 MG PO TABS
5.0000 mg | ORAL_TABLET | Freq: Two times a day (BID) | ORAL | Status: DC
  Administered 2012-07-03 – 2012-07-06 (×6): 5 mg via ORAL
  Filled 2012-07-03 (×6): qty 1

## 2012-07-03 MED ORDER — SODIUM CHLORIDE 0.9 % IV BOLUS (SEPSIS)
500.0000 mL | Freq: Once | INTRAVENOUS | Status: AC
  Administered 2012-07-03: 500 mL via INTRAVENOUS

## 2012-07-03 MED ORDER — ONDANSETRON HCL 4 MG/2ML IJ SOLN: 4.0000 mg | Freq: Four times a day (QID) | INTRAMUSCULAR | Status: DC | PRN

## 2012-07-03 MED ORDER — PANTOPRAZOLE SODIUM 40 MG PO TBEC
40.0000 mg | DELAYED_RELEASE_TABLET | Freq: Every day | ORAL | Status: DC
  Administered 2012-07-03 – 2012-07-06 (×4): 40 mg via ORAL
  Filled 2012-07-03 (×4): qty 1

## 2012-07-03 MED ORDER — SODIUM CHLORIDE 0.9 % IV SOLN: INTRAVENOUS | Status: DC

## 2012-07-03 MED ORDER — PNEUMOCOCCAL VAC POLYVALENT 25 MCG/0.5ML IJ INJ
0.5000 mL | INJECTION | INTRAMUSCULAR | Status: AC
  Administered 2012-07-04: 0.5 mL via INTRAMUSCULAR
  Filled 2012-07-03: qty 0.5

## 2012-07-03 MED ORDER — WARFARIN SODIUM 1 MG PO TABS
1.0000 mg | ORAL_TABLET | ORAL | Status: AC
  Administered 2012-07-03: 1 mg via ORAL
  Filled 2012-07-03: qty 1

## 2012-07-03 MED ORDER — ONDANSETRON HCL 4 MG/2ML IJ SOLN
4.0000 mg | Freq: Once | INTRAMUSCULAR | Status: AC
  Administered 2012-07-03: 4 mg via INTRAVENOUS
  Filled 2012-07-03: qty 2

## 2012-07-03 MED ORDER — SODIUM CHLORIDE 0.9 % IV SOLN
INTRAVENOUS | Status: DC
  Administered 2012-07-03 – 2012-07-04 (×3): via INTRAVENOUS
  Administered 2012-07-04: 1000 mL via INTRAVENOUS
  Administered 2012-07-05: 12:00:00 via INTRAVENOUS

## 2012-07-03 MED ORDER — ATORVASTATIN CALCIUM 10 MG PO TABS
10.0000 mg | ORAL_TABLET | Freq: Every day | ORAL | Status: DC
  Administered 2012-07-03 – 2012-07-05 (×3): 10 mg via ORAL
  Filled 2012-07-03 (×3): qty 1

## 2012-07-03 MED ORDER — DILTIAZEM HCL 100 MG IV SOLR
5.0000 mg/h | INTRAVENOUS | Status: DC
  Administered 2012-07-03: 10 mg/h via INTRAVENOUS

## 2012-07-03 MED ORDER — OFF THE BEAT BOOK
Freq: Once | Status: AC
  Administered 2012-07-04: 12:00:00
  Filled 2012-07-03: qty 1

## 2012-07-03 MED ORDER — SIMVASTATIN 20 MG PO TABS: 20.0000 mg | ORAL_TABLET | Freq: Every evening | ORAL | Status: DC

## 2012-07-03 MED ORDER — MORPHINE SULFATE 2 MG/ML IJ SOLN
2.0000 mg | Freq: Once | INTRAMUSCULAR | Status: AC
  Administered 2012-07-03: 2 mg via INTRAVENOUS
  Filled 2012-07-03: qty 1

## 2012-07-03 MED ORDER — HYDROCODONE-ACETAMINOPHEN 5-325 MG PO TABS
1.0000 | ORAL_TABLET | ORAL | Status: DC | PRN
  Administered 2012-07-03 – 2012-07-06 (×11): 1 via ORAL
  Filled 2012-07-03 (×11): qty 1

## 2012-07-03 MED ORDER — METOPROLOL TARTRATE 25 MG PO TABS
25.0000 mg | ORAL_TABLET | Freq: Two times a day (BID) | ORAL | Status: DC
Start: 2012-07-03 — End: 2012-07-06
  Administered 2012-07-03 – 2012-07-06 (×6): 25 mg via ORAL
  Filled 2012-07-03 (×6): qty 1

## 2012-07-03 MED ORDER — CHLORHEXIDINE GLUCONATE CLOTH 2 % EX PADS
6.0000 | MEDICATED_PAD | Freq: Every day | CUTANEOUS | Status: DC
  Administered 2012-07-04 – 2012-07-06 (×3): 6 via TOPICAL

## 2012-07-03 MED ORDER — ONDANSETRON HCL 4 MG PO TABS: 4.0000 mg | ORAL_TABLET | Freq: Four times a day (QID) | ORAL | Status: DC | PRN

## 2012-07-03 MED ORDER — AMITRIPTYLINE HCL 25 MG PO TABS
25.0000 mg | ORAL_TABLET | Freq: Every day | ORAL | Status: DC
  Administered 2012-07-03 – 2012-07-05 (×3): 25 mg via ORAL
  Filled 2012-07-03 (×3): qty 1

## 2012-07-03 MED ORDER — ENOXAPARIN SODIUM 30 MG/0.3ML ~~LOC~~ SOLN: 30.0000 mg | SUBCUTANEOUS | Status: DC

## 2012-07-03 MED ORDER — MUPIROCIN 2 % EX OINT
1.0000 "application " | TOPICAL_OINTMENT | Freq: Two times a day (BID) | CUTANEOUS | Status: DC
  Administered 2012-07-03 – 2012-07-06 (×6): 1 via NASAL
  Filled 2012-07-03: qty 22

## 2012-07-03 MED ORDER — DONEPEZIL HCL 5 MG PO TABS
5.0000 mg | ORAL_TABLET | Freq: Every day | ORAL | Status: DC
  Administered 2012-07-03 – 2012-07-05 (×3): 5 mg via ORAL
  Filled 2012-07-03 (×3): qty 1

## 2012-07-03 MED ORDER — DILTIAZEM HCL 100 MG IV SOLR
5.0000 mg/h | Freq: Once | INTRAVENOUS | Status: AC
  Administered 2012-07-03: 5 mg/h via INTRAVENOUS

## 2012-07-03 MED ORDER — ALUM & MAG HYDROXIDE-SIMETH 200-200-20 MG/5ML PO SUSP: 30.0000 mL | Freq: Four times a day (QID) | ORAL | Status: DC | PRN

## 2012-07-03 MED ORDER — ACETAMINOPHEN 650 MG RE SUPP: 650.0000 mg | Freq: Four times a day (QID) | RECTAL | Status: DC | PRN

## 2012-07-03 NOTE — ED Notes (Signed)
Pt states she has been thinking about her family that has died over the years. States she is anxious. Pt hyperventalating at present

## 2012-07-03 NOTE — ED Notes (Signed)
Patient provided sprite per request. HR 122, A fib, RVR. Dr Deretha Emory made aware and verbal order for Cardizem drip.

## 2012-07-03 NOTE — ED Provider Notes (Signed)
History    This chart was scribed for [name], MD by Sofie Rower, ED Scribe. The patient was seen in room APA08/APA08 and the patient's care was started at 11:31AM.    CSN: [id number]  Arrival date & time 07/03/12  1056   First MD Initiated Contact with Patient 07/03/12 1131      Chief Complaint  Patient presents with  . Anxiety    (Consider location/radiation/quality/duration/timing/severity/associated sxs/prior treatment) Patient is a 79 y.o. female presenting with diarrhea. The history is provided by the patient. No language interpreter was used.  Diarrhea Quality:  Unable to specify Severity:  Moderate Onset quality:  Gradual Duration:  7 days Timing:  Constant Progression:  Worsening Relieved by:  Nothing Worsened by:  Nothing tried Ineffective treatments:  None tried Associated symptoms: abdominal pain, headaches, myalgias and vomiting   Associated symptoms: no chills and no fever     Sheri Matthews is a 79 y.o. female , with a hx of depression and atrial fibrilation, who presents to the Emergency Department complaining of sudden, progressively worsening, diarrhea (multiple episodes), onset 7 days ago (06/26/12).  Associated symptoms include vomiting (X 1), shortness of breath, left sided chest pain, left sided abdominal pain, diffuse headache, and dizziness. The pt reports she is feeling terrible at the present moment. She also informs she has not been eating and nor sleeping regularly for the past week. Furthermore, the pt claims she fell two weeks ago, landing upon a hard surface, impacting directly at her left breast, and has felt abnormal ever since. Importantly, the pt informs that she is taking coumadin at the present point and time.  The pt denies neck pain, back pain, swelling in the lower extremities, nausea, dysuria, fevers, chills, and rash.  The pt does not smoke or drink alcohol.  PCP is Dr. Juanetta Gosling, however, her last visit was 6 weeks ago.    Past  Medical History  Diagnosis Date  . Hypertension   . Arthritis   . Shortness of breath   . Stroke   . GERD (gastroesophageal reflux disease)   . Dysrhythmia   . Pneumonia   . Depression   . Anemia     Past Surgical History  Procedure Laterality Date  . Back surgery    . Ovaries  removed    No family history on file.  History  Substance Use Topics  . Smoking status: Never Smoker   . Smokeless tobacco: Never Used  . Alcohol Use: No    OB History   Grav Para Term Preterm Abortions TAB SAB Ect Mult Living                  Review of Systems  Constitutional: Negative for fever and chills.  HENT: Negative for neck pain.   Eyes: Negative for visual disturbance.  Respiratory: Positive for shortness of breath.   Cardiovascular: Positive for chest pain.  Gastrointestinal: Positive for vomiting, abdominal pain and diarrhea. Negative for nausea.  Genitourinary: Negative for dysuria.  Musculoskeletal: Positive for myalgias. Negative for back pain and joint swelling.  Skin: Negative for rash.  Neurological: Positive for dizziness and headaches.  Hematological: Bruises/bleeds easily.  All other systems reviewed and are negative.    Allergies  Review of patient's allergies indicates no known allergies.  Home Medications   Current Outpatient Rx  Name  Route  Sig  Dispense  Refill  . amitriptyline (ELAVIL) 25 MG tablet   Oral   Take 25 mg by mouth  at bedtime.         . busPIRone (BUSPAR) 5 MG tablet   Oral   Take 5 mg by mouth 2 (two) times daily.         Marland Kitchen diltiazem (CARDIZEM CD) 180 MG 24 hr capsule   Oral   Take 1 capsule (180 mg total) by mouth daily.   30 capsule   2   . donepezil (ARICEPT) 5 MG tablet   Oral   Take 5 mg by mouth at bedtime.          Marland Kitchen HYDROcodone-acetaminophen (NORCO/VICODIN) 5-325 MG per tablet   Oral   Take 1 tablet by mouth every 6 (six) hours as needed for pain.         . metoprolol (LOPRESSOR) 50 MG tablet   Oral   Take  25 mg by mouth 2 (two) times daily.          Marland Kitchen omeprazole (PRILOSEC) 20 MG capsule   Oral   Take 20 mg by mouth daily.         . simvastatin (ZOCOR) 20 MG tablet   Oral   Take 20 mg by mouth every evening.         . warfarin (COUMADIN) 2 MG tablet   Oral   Take 1-2 mg by mouth daily. Alternates every other day.           Pulse 84  Temp(Src) 98 F (36.7 C) (Oral)  Resp 40  Wt 124 lb (56.246 kg)  BMI 23.44 kg/m2  SpO2 98%  Physical Exam  Nursing note and vitals reviewed. Constitutional: She is oriented to person, place, and time. She appears well-developed and well-nourished. No distress.  HENT:  Head: Normocephalic and atraumatic.  Eyes: EOM are normal. Pupils are equal, round, and reactive to light.  Neck: Neck supple. No tracheal deviation present.  Cardiovascular: Normal heart sounds.  An irregular rhythm present. Tachycardia present.   No murmur heard. Pulmonary/Chest: Effort normal and breath sounds normal. No respiratory distress. She has no wheezes.  Abdominal: Soft. Bowel sounds are normal. She exhibits no distension. There is no tenderness.  Musculoskeletal: Normal range of motion. She exhibits no edema.  Neurological: She is alert and oriented to person, place, and time. No sensory deficit.  Skin: Skin is warm and dry.  Psychiatric: She has a normal mood and affect. Her behavior is normal.    ED Course  Procedures (including critical care time)  DIAGNOSTIC STUDIES: Oxygen Saturation is 98% on room air, normal by my interpretation.    COORDINATION OF CARE:  11:49 AM- HR 147 at this time. Treatment plan discussed with patient and pt's daughter. Pt and pt's daughter agree with treatment.      Results for orders placed during the hospital encounter of 07/03/12  CBC WITH DIFFERENTIAL      Result Value Range   WBC 9.6  4.0 - 10.5 K/uL   RBC 4.38  3.87 - 5.11 MIL/uL   Hemoglobin 13.5  12.0 - 15.0 g/dL   HCT 16.1  09.6 - 04.5 %   MCV 91.8  78.0 -  100.0 fL   MCH 30.8  26.0 - 34.0 pg   MCHC 33.6  30.0 - 36.0 g/dL   RDW 40.9  81.1 - 91.4 %   Platelets 369  150 - 400 K/uL   Neutrophils Relative 77  43 - 77 %   Neutro Abs 7.4  1.7 - 7.7 K/uL   Lymphocytes Relative 16  12 - 46 %   Lymphs Abs 1.6  0.7 - 4.0 K/uL   Monocytes Relative 7  3 - 12 %   Monocytes Absolute 0.6  0.1 - 1.0 K/uL   Eosinophils Relative 0  0 - 5 %   Eosinophils Absolute 0.0  0.0 - 0.7 K/uL   Basophils Relative 0  0 - 1 %   Basophils Absolute 0.0  0.0 - 0.1 K/uL  COMPREHENSIVE METABOLIC PANEL      Result Value Range   Sodium 136  135 - 145 mEq/L   Potassium 3.8  3.5 - 5.1 mEq/L   Chloride 101  96 - 112 mEq/L   CO2 21  19 - 32 mEq/L   Glucose, Bld 94  70 - 99 mg/dL   BUN 23  6 - 23 mg/dL   Creatinine, Ser 8.29 (*) 0.50 - 1.10 mg/dL   Calcium 56.2  8.4 - 13.0 mg/dL   Total Protein 7.4  6.0 - 8.3 g/dL   Albumin 4.0  3.5 - 5.2 g/dL   AST 31  0 - 37 U/L   ALT 19  0 - 35 U/L   Alkaline Phosphatase 104  39 - 117 U/L   Total Bilirubin 0.6  0.3 - 1.2 mg/dL   GFR calc non Af Amer 31 (*) >90 mL/min   GFR calc Af Amer 36 (*) >90 mL/min  LIPASE, BLOOD      Result Value Range   Lipase 48  11 - 59 U/L  URINALYSIS, ROUTINE W REFLEX MICROSCOPIC      Result Value Range   Color, Urine YELLOW  YELLOW   APPearance CLOUDY (*) CLEAR   Specific Gravity, Urine 1.025  1.005 - 1.030   pH 6.0  5.0 - 8.0   Glucose, UA NEGATIVE  NEGATIVE mg/dL   Hgb urine dipstick NEGATIVE  NEGATIVE   Bilirubin Urine MODERATE (*) NEGATIVE   Ketones, ur 15 (*) NEGATIVE mg/dL   Protein, ur 865 (*) NEGATIVE mg/dL   Urobilinogen, UA 0.2  0.0 - 1.0 mg/dL   Nitrite NEGATIVE  NEGATIVE   Leukocytes, UA TRACE (*) NEGATIVE  PRO B NATRIURETIC PEPTIDE      Result Value Range   Pro B Natriuretic peptide (BNP) 4515.0 (*) 0 - 450 pg/mL  PROTIME-INR      Result Value Range   Prothrombin Time 23.7 (*) 11.6 - 15.2 seconds   INR 2.23 (*) 0.00 - 1.49  URINE MICROSCOPIC-ADD ON      Result Value Range    Squamous Epithelial / LPF FEW (*) RARE   WBC, UA 3-6  <3 WBC/hpf   RBC / HPF 3-6  <3 RBC/hpf   Bacteria, UA MANY (*) RARE   Casts GRANULAR CAST (*) NEGATIVE   Dg Ribs Unilateral W/chest Left  07/03/2012  *RADIOLOGY REPORT*  Clinical Data: Anxiety.  Loss of appetite. Recent fall.  Left anterior rib pain  LEFT RIBS AND CHEST - 3+ VIEW  Comparison: 05/23/2011  Findings: Heart is upper limits normal in size.  Lungs are clear. No effusions or pneumothorax.  No acute bony abnormality.  No visible rib fracture.  IMPRESSION: No acute cardiopulmonary disease.   Original Report Authenticated By: Charlett Nose, M.D.     Date: 07/03/2012  Rate: 168  Rhythm: atrial fibrillation  QRS Axis: normal  Intervals: normal  ST/T Wave abnormalities: nonspecific ST/T changes  Conduction Disutrbances:none  Narrative Interpretation:   Old EKG Reviewed: changes noted Previous EKG was atrial fibrillation but not rapid.  1. Atrial fibrillation with rapid ventricular response     CRITICAL CARE Performed by: [name].   Total critical care time: 30  Critical care time was exclusive of separately billable procedures and treating other patients.  Critical care was necessary to treat or prevent imminent or life-threatening deterioration.  Critical care was time spent personally by me on the following activities: development of treatment plan with patient and/or surrogate as well as nursing, discussions with consultants, evaluation of patient's response to treatment, examination of patient, obtaining history from patient or surrogate, ordering and performing treatments and interventions, ordering and review of laboratory studies, ordering and review of radiographic studies, pulse oximetry and re-evaluation of patient's condition.   MDM   Bleed patient's symptoms are related to her rapid atrial fib. Also sounds as if she has not been very compliant or medicines because she wasn't feeling good. Patient  treated in ED with 10 mg of diltiazem IV push with a sniffing improvement in heart rate from the 170s down to 86 patient felt much better. But has slowly crept back up now around the 148. Restart on diltiazem as a drip. Admission will be required. Contacted the Dr. Juanetta Gosling who will admit to step down unit. Temporary middle orders provided. Patient still markedly improved no acute distress nontoxic currently. Chest x-ray was negative suspect is also a left chest wall rib contusions on the left side from her fall earlier resulting in the clearly chest wall I pain in that area. The pain is reproducible. Patient's electrolytes without significant abnormalities other than a slight increase in her creatinine up to 1.48 no significant leukocytosis no other electrolyte abnormalities. Urinalysis with questionable urinary tract infection urine culture has been sent. Chest x-ray was negative for pneumonia or pulmonary edema.     I personally performed the services described in this documentation, which was scribed in my presence. The recorded information has been reviewed and is accurate.     [name], MD 07/03/12 [phone]

## 2012-07-03 NOTE — Progress Notes (Signed)
ANTICOAGULATION CONSULT NOTE - Initial Consult  Pharmacy Consult for Coumadin Indication: atrial fibrillation  No Known Allergies  Patient Measurements: Height: 5\' 2"  (157.5 cm) Weight: 132 lb 15 oz (60.3 kg) IBW/kg (Calculated) : 50.1  Vital Signs: Temp: 97.9 F (36.6 C) (02/19 1552) Temp src: Oral (02/19 1552) BP: 120/68 mmHg (02/19 1800) Pulse Rate: 84 (02/19 1800)  Labs:  Recent Labs  07/03/12 1159  HGB 13.5  HCT 40.2  PLT 369  LABPROT 23.7*  INR 2.23*  CREATININE 1.48*    Estimated Creatinine Clearance: 23.3 ml/min (by C-G formula based on Cr of 1.48).   Medical History: Past Medical History  Diagnosis Date  . Hypertension   . Arthritis   . Shortness of breath   . Stroke   . GERD (gastroesophageal reflux disease)   . Dysrhythmia   . Pneumonia   . Depression   . Anemia     Medications:  Scheduled:  . sodium chloride   Intravenous STAT  . amitriptyline  25 mg Oral QHS  . atorvastatin  10 mg Oral q1800  . busPIRone  5 mg Oral BID  . [START ON 07/04/2012] diltiazem  180 mg Oral Daily  . [COMPLETED] diltiazem (CARDIZEM) infusion  5-15 mg/hr Intravenous Once  . [COMPLETED] diltiazem  10 mg Intravenous Once  . donepezil  5 mg Oral QHS  . metoprolol  25 mg Oral BID  . [COMPLETED] morphine  2 mg Intravenous Once  . [COMPLETED] ondansetron  4 mg Intravenous Once  . pantoprazole  40 mg Oral Daily  . [START ON 07/04/2012] pneumococcal 23 valent vaccine  0.5 mL Intramuscular Tomorrow-1000  . [COMPLETED] sodium chloride  500 mL Intravenous Once  . warfarin  1 mg Oral NOW  . [START ON 07/04/2012] Warfarin - Pharmacist Dosing Inpatient   Does not apply q1800  . [DISCONTINUED] enoxaparin (LOVENOX) injection  30 mg Subcutaneous Q24H  . [DISCONTINUED] simvastatin  20 mg Oral QPM    Assessment: 79 yo F admitted with symptomatic Afib is already on chronic warfarin 1mg  alternating with 2mg  daily.  INR is therapeutic on admission. No bleeding noted.   Goal of  Therapy:  INR 2-3   Plan:  Coumadin 1mg  po x 1 tonight Daily INR D/C Lovenox (INR>2)  Elson Clan 07/03/2012,8:06 PM

## 2012-07-03 NOTE — ED Notes (Signed)
Report given to Tobi Bastos, RN ICU. Ready to receive patient.

## 2012-07-03 NOTE — ED Notes (Signed)
MD at bedside. 

## 2012-07-03 NOTE — ED Notes (Signed)
Attempted to call report. RN unavailable. Requested to call back in 15 minutes.

## 2012-07-04 LAB — CBC
HCT: 34.5 % — ABNORMAL LOW (ref 36.0–46.0)
MCHC: 32.2 g/dL (ref 30.0–36.0)
MCV: 94.5 fL (ref 78.0–100.0)
RDW: 13.3 % (ref 11.5–15.5)
WBC: 8.4 10*3/uL (ref 4.0–10.5)

## 2012-07-04 LAB — BASIC METABOLIC PANEL
BUN: 23 mg/dL (ref 6–23)
CO2: 22 mEq/L (ref 19–32)
Chloride: 111 mEq/L (ref 96–112)
Creatinine, Ser: 1.29 mg/dL — ABNORMAL HIGH (ref 0.50–1.10)

## 2012-07-04 MED ORDER — WARFARIN SODIUM 1 MG PO TABS
1.0000 mg | ORAL_TABLET | Freq: Once | ORAL | Status: AC
  Administered 2012-07-04: 1 mg via ORAL
  Filled 2012-07-04: qty 1

## 2012-07-04 MED ORDER — ENSURE COMPLETE PO LIQD
237.0000 mL | Freq: Two times a day (BID) | ORAL | Status: DC
  Administered 2012-07-04: 12:00:00 via ORAL
  Administered 2012-07-05 – 2012-07-06 (×3): 237 mL via ORAL

## 2012-07-04 MED ORDER — DILTIAZEM HCL 60 MG PO TABS
60.0000 mg | ORAL_TABLET | Freq: Four times a day (QID) | ORAL | Status: DC
  Administered 2012-07-04 – 2012-07-06 (×9): 60 mg via ORAL
  Filled 2012-07-04 (×7): qty 1

## 2012-07-04 NOTE — Progress Notes (Signed)
Pt HR had been dipping down into the mid 40's to mid 50's, went into check on pt and woke her up and HR immediately came back to normal in the 60's where it had been.  Left room.  Pt dozed off again and the same thing occurred w/ HR low 40's to low 50's this time.  When I woke pt to check on HR, this time it did not rebound as it did before.  Titrated the cardizem drip down to 5.  Left room and the same thing happened again.  I paged MD to make aware and mistakenly paged TR[facility].  MD did return my page to remind me that the pt's MD was not TRH.  I then conferred w/ charge nurse and she suggested that I turn the drip down to 2.5 but the IV pump would not let me b/c it is below guardrails.  At this time, I decided to go ahead and page Elink from the room.  The cameraed in and consulted w/ me.  Magda Paganini, RN consulted w/ Pola Corn MD and they told me to go ahead and titrate the drip OFF.  Drip turned off, will cont to monitor pt and her HR.  When I asked how the pt felt, she stated that she felt fine and in fact, she felt like her HR had calmed down a lot and was not jumping around in her chest anymore

## 2012-07-04 NOTE — Progress Notes (Signed)
Subjective: She was admitted with atrial fibrillation with rapid ventricular response and started on a Cardizem drip. She is now off the drip. Her heart rate is in the 80-110 range. She does complain of some pain in her right arm. She's otherwise doing okay.  Objective: Vital signs in last 24 hours: Temp:  [97.9 F (36.6 C)-98.3 F (36.8 C)] 98.3 F (36.8 C) (02/20 0808) Pulse Rate:  [45-135] 69 (02/20 0645) Resp:  [13-40] 17 (02/20 0645) BP: (85-148)/(30-102) 133/72 mmHg (02/20 0645) SpO2:  [86 %-99 %] 97 % (02/20 0645) Weight:  [56.246 kg (124 lb)-62.4 kg (137 lb 9.1 oz)] 62.4 kg (137 lb 9.1 oz) (02/20 0500) Weight change:  Last BM Date: 07/03/12  Intake/Output from previous day: 02/19 0701 - 02/20 0700 In: 1179.7 [I.V.:1179.7] Out: -   PHYSICAL EXAM General appearance: alert, cooperative and no distress Resp: clear to auscultation bilaterally Cardio: irregularly irregular rhythm GI: soft, non-tender; bowel sounds normal; no masses,  no organomegaly Extremities: extremities normal, atraumatic, no cyanosis or edema  Lab Results:    Basic Metabolic Panel:  Recent Labs  16/10/96 1159 07/04/12 0442  NA 136 142  K 3.8 3.4*  CL 101 111  CO2 21 22  GLUCOSE 94 86  BUN 23 23  CREATININE 1.48* 1.29*  CALCIUM 10.1 8.4   Liver Function Tests:  Recent Labs  07/03/12 1159  AST 31  ALT 19  ALKPHOS 104  BILITOT 0.6  PROT 7.4  ALBUMIN 4.0    Recent Labs  07/03/12 1159  LIPASE 48   No results found for this basename: AMMONIA,  in the last 72 hours CBC:  Recent Labs  07/03/12 1159 07/04/12 0442  WBC 9.6 8.4  NEUTROABS 7.4  --   HGB 13.5 11.1*  HCT 40.2 34.5*  MCV 91.8 94.5  PLT 369 323   Cardiac Enzymes: No results found for this basename: CKTOTAL, CKMB, CKMBINDEX, TROPONINI,  in the last 72 hours BNP:  Recent Labs  07/03/12 1159  PROBNP 4515.0*   D-Dimer: No results found for this basename: DDIMER,  in the last 72 hours CBG: No results found  for this basename: GLUCAP,  in the last 72 hours Hemoglobin A1C: No results found for this basename: HGBA1C,  in the last 72 hours Fasting Lipid Panel: No results found for this basename: CHOL, HDL, LDLCALC, TRIG, CHOLHDL, LDLDIRECT,  in the last 72 hours Thyroid Function Tests: No results found for this basename: TSH, T4TOTAL, FREET4, T3FREE, THYROIDAB,  in the last 72 hours Anemia Panel: No results found for this basename: VITAMINB12, FOLATE, FERRITIN, TIBC, IRON, RETICCTPCT,  in the last 72 hours Coagulation:  Recent Labs  07/03/12 1159 07/04/12 0442  LABPROT 23.7* 27.1*  INR 2.23* 2.67*   Urine Drug Screen: Drugs of Abuse  No results found for this basename: labopia, cocainscrnur, labbenz, amphetmu, thcu, labbarb    Alcohol Level: No results found for this basename: ETH,  in the last 72 hours Urinalysis:  Recent Labs  07/03/12 1207  COLORURINE YELLOW  LABSPEC 1.025  PHURINE 6.0  GLUCOSEU NEGATIVE  HGBUR NEGATIVE  BILIRUBINUR MODERATE*  KETONESUR 15*  PROTEINUR 100*  UROBILINOGEN 0.2  NITRITE NEGATIVE  LEUKOCYTESUR TRACE*   Misc. Labs:  ABGS No results found for this basename: PHART, PCO2, PO2ART, TCO2, HCO3,  in the last 72 hours CULTURES Recent Results (from the past 240 hour(s))  MRSA PCR SCREENING     Status: Abnormal   Collection Time    07/03/12  4:08 PM  Result Value Range Status   MRSA by PCR POSITIVE (*) NEGATIVE Final   Comment:            The GeneXpert MRSA Assay (FDA     approved for NASAL specimens     only), is one component of a     comprehensive MRSA colonization     surveillance program. It is not     intended to diagnose MRSA     infection nor to guide or     monitor treatment for     MRSA infections.     RESULT CALLED TO, READ BACK BY AND VERIFIED WITH:     MABE,M. AT 1940 ON 06/28/2012 BY BAUGHAM,M.   Studies/Results: Dg Ribs Unilateral W/chest Left  07/03/2012  *RADIOLOGY REPORT*  Clinical Data: Anxiety.  Loss of appetite.  Recent fall.  Left anterior rib pain  LEFT RIBS AND CHEST - 3+ VIEW  Comparison: 05/23/2011  Findings: Heart is upper limits normal in size.  Lungs are clear. No effusions or pneumothorax.  No acute bony abnormality.  No visible rib fracture.  IMPRESSION: No acute cardiopulmonary disease.   Original Report Authenticated By: Charlett Nose, M.D.     Medications:  Prior to Admission:  Prescriptions prior to admission  Medication Sig Dispense Refill  . amitriptyline (ELAVIL) 25 MG tablet Take 25 mg by mouth at bedtime.      . busPIRone (BUSPAR) 5 MG tablet Take 5 mg by mouth 2 (two) times daily.      Marland Kitchen diltiazem (CARDIZEM CD) 180 MG 24 hr capsule Take 1 capsule (180 mg total) by mouth daily.  30 capsule  2  . donepezil (ARICEPT) 5 MG tablet Take 5 mg by mouth at bedtime.       Marland Kitchen HYDROcodone-acetaminophen (NORCO/VICODIN) 5-325 MG per tablet Take 1 tablet by mouth every 6 (six) hours as needed for pain.      . metoprolol (LOPRESSOR) 50 MG tablet Take 25 mg by mouth 2 (two) times daily.       Marland Kitchen omeprazole (PRILOSEC) 20 MG capsule Take 20 mg by mouth daily.      . simvastatin (ZOCOR) 20 MG tablet Take 20 mg by mouth every evening.      . warfarin (COUMADIN) 2 MG tablet Take 1-2 mg by mouth daily. Alternates every other day.       Scheduled: . amitriptyline  25 mg Oral QHS  . atorvastatin  10 mg Oral q1800  . busPIRone  5 mg Oral BID  . Chlorhexidine Gluconate Cloth  6 each Topical Q0600  . diltiazem  180 mg Oral Daily  . donepezil  5 mg Oral QHS  . metoprolol  25 mg Oral BID  . mupirocin ointment  1 application Nasal BID  . off the beat book   Does not apply Once  . pantoprazole  40 mg Oral Daily  . pneumococcal 23 valent vaccine  0.5 mL Intramuscular Tomorrow-1000  . Warfarin - Pharmacist Dosing Inpatient   Does not apply q1800   Continuous: . sodium chloride 100 mL/hr at 07/04/12 0600  . sodium chloride 75 mL/hr at 07/04/12 0054  . diltiazem (CARDIZEM) infusion Stopped (07/04/12 0306)    EAV:[id number], acetaminophen, alum & mag hydroxide-simeth, HYDROcodone-acetaminophen, ondansetron (ZOFRAN) IV, ondansetron  Assesment: She has atrial fibrillation initially with rapid ventricular response but now better. Active Problems:   * No active hospital problems. *    Plan: I'm going to increase her home dose of Cardizem slightly although it's not  totally clear that she had taken it because she was sick. She will move from the step down    LOS: 1 day   Sheri Matthews 07/04/2012, 8:23 AM

## 2012-07-04 NOTE — Plan of Care (Signed)
Problem: Phase II Progression Outcomes Goal: Progress activity as tolerated unless otherwise ordered Outcome: Progressing Patient up to chair today for 2 hours.

## 2012-07-04 NOTE — Care Management Note (Signed)
    Page 1 of 1   07/05/2012     4:25:58 PM   CARE MANAGEMENT NOTE 07/05/2012  Patient:  Sheri Matthews, Sheri Matthews   Account Number:  [id number]  Date Initiated:  07/04/2012  Documentation initiated by:  Sharrie Rothman  Subjective/Objective Assessment:   Pt admitted from home with a fib with rvr. Pt lives alone but has 2 daughters who are very active in the care of the pt. Pt also has 2 neighbors who help care for her. Pt has cane and walker but is mostly indpendent with ADL's.     Action/Plan:   No CM or HH needs.   Anticipated DC Date:  07/05/2012   Anticipated DC Plan:  HOME/SELF CARE      DC Planning Services  CM consult      Choice offered to / List presented to:             Status of service:  Completed, signed off Medicare Important Message given?  YES (If response is "NO", the following Medicare IM given date fields will be blank) Date Medicare IM given:  07/05/2012 Date Additional Medicare IM given:    Discharge Disposition:  HOME/SELF CARE  Per UR Regulation:    If discussed at Long Length of Stay Meetings, dates discussed:    Comments:  07/05/12 1625 Arlyss Queen, RN BSN CM Pt potential discharge over weekend. No CM or HH needs noted.  07/04/12 1545 Arlyss Queen, RN BSN CM

## 2012-07-04 NOTE — H&P (Signed)
NAME:  DRU, LAUREL                ACCOUNT NO.:  [id number]  MEDICAL RECORD NO.:  [id number]  LOCATION:  [facility]                         FACILITY:  [facility]  PHYSICIAN:  Nonie Lochner L. Juanetta Gosling, M.D.DATE OF BIRTH:  [birth year]  DATE OF ADMISSION:  07/03/2012 DATE OF DISCHARGE:  LH                             HISTORY & PHYSICAL   REASON FOR ADMISSION:  Atrial fibrillation with rapid ventricular response.  HISTORY:  Ms. Glasser is an 79 year old, who came to the emergency room basically because she said she had anxiety.  After she was evaluated, it appeared that her problem was actually atrial fib with rapid ventricular response.  She has not had any chest pain.  She has had some diarrhea and has not felt well and apparently has not taken all of her medications.  PAST MEDICAL HISTORY:  She has a past medical history of depression, atrial fibrillation.  She has hypertension, osteoarthritis, history of stroke, GERD, pneumonia, depression as mentioned, and anemia.  PAST SURGICAL HISTORY:  Surgically, she has had back surgery and had an oophorectomy.  SOCIAL HISTORY:  She is a nonsmoker.  She does not use any alcohol.  REVIEW OF SYSTEMS:  Positive for shortness of breath, chest pain, vomiting, and abdominal pain, diarrhea, myalgias, dizziness, headaches, and bruising.  MEDICATIONS:  Elavil 25 mg at bedtime, BuSpar 5 mg b.i.d., diltiazem CD 180 mg daily, Aricept 5 mg at bedtime, Norco 1 every 6 hours as needed, metoprolol 50 mg b.i.d., omeprazole 20 mg daily, simvastatin 20 mg daily, and Coumadin.  PHYSICAL EXAMINATION:  VITAL SIGNS:  Shows that her pulse is 84, temperature is 98, respirations 40 initially.  Her weight 124 with a BMI of 23.44, O2 sats 98%. GENERAL:  She is awake and alert.  Pupils are reactive.  Nose and throat are clear.  Mucous membranes are moist. NECK:  Supple without masses.  She has irregular rhythm with rapid ventricular response on admission. CHEST:  Clear without  wheezes. HEART:  Regular without murmur, gallop, or rub. ABDOMEN:  Soft without masses.  Bowel sounds are present and active. EXTREMITIES:  No edema. NEUROLOGICAL:  She is intact.  LABORATORY DATA:  White blood count is 9600, hemoglobin is 13.5, platelets 369.  Electrolytes are normal.  BUN of 23, creatinine 1.48. Liver enzymes are normal.  Urine essentially negative.  BNP 4515, INR is 2.23.  Urine is essentially negative.  IMAGING:  Chest x-ray does not show any acute disease.  EKG shows atrial fibrillation with rapid ventricular response.  ASSESSMENT:  She has atrial fibrillation with rapid ventricular response.  She has elevated BNP, which I think may be related to her atrial fib because she does not show evidence of any sort of congestive heart failure on chest x-ray, etc.  PLAN:  Admitted for IV diltiazem.  She will continue with her other treatments.     Corita Allinson L. Juanetta Gosling, M.D.     ELH/MEDQ  D:  07/03/2012  T:  07/04/2012  Job:  [id number]

## 2012-07-04 NOTE — Progress Notes (Signed)
Agree with plan.  Royann Shivers MS,RD,LDN,CSG Office: [phone] Pager: [phone]

## 2012-07-04 NOTE — Progress Notes (Signed)
ANTICOAGULATION CONSULT NOTE   Pharmacy Consult for Coumadin Indication: atrial fibrillation  No Known Allergies  Patient Measurements: Height: 5\' 2"  (157.5 cm) Weight: 137 lb 9.1 oz (62.4 kg) IBW/kg (Calculated) : 50.1  Vital Signs: Temp: 98.3 F (36.8 C) (02/20 0808) Temp src: Oral (02/20 0808) BP: 123/63 mmHg (02/20 0951) Pulse Rate: 88 (02/20 0910)  Labs:  Recent Labs  07/03/12 1159 07/04/12 0442  HGB 13.5 11.1*  HCT 40.2 34.5*  PLT 369 323  LABPROT 23.7* 27.1*  INR 2.23* 2.67*  CREATININE 1.48* 1.29*   Estimated Creatinine Clearance: 27.2 ml/min (by C-G formula based on Cr of 1.29).  Medical History: Past Medical History  Diagnosis Date  . Hypertension   . Arthritis   . Shortness of breath   . Stroke   . GERD (gastroesophageal reflux disease)   . Dysrhythmia   . Pneumonia   . Depression   . Anemia    Medications:  Scheduled:  . [COMPLETED] sodium chloride   Intravenous STAT  . amitriptyline  25 mg Oral QHS  . atorvastatin  10 mg Oral q1800  . busPIRone  5 mg Oral BID  . Chlorhexidine Gluconate Cloth  6 each Topical Q0600  . [COMPLETED] diltiazem (CARDIZEM) infusion  5-15 mg/hr Intravenous Once  . [COMPLETED] diltiazem  10 mg Intravenous Once  . diltiazem  60 mg Oral Q6H  . donepezil  5 mg Oral QHS  . metoprolol  25 mg Oral BID  . [COMPLETED] morphine  2 mg Intravenous Once  . mupirocin ointment  1 application Nasal BID  . off the beat book   Does not apply Once  . [COMPLETED] ondansetron  4 mg Intravenous Once  . pantoprazole  40 mg Oral Daily  . pneumococcal 23 valent vaccine  0.5 mL Intramuscular Tomorrow-1000  . [COMPLETED] sodium chloride  500 mL Intravenous Once  . [COMPLETED] warfarin  1 mg Oral NOW  . warfarin  1 mg Oral ONCE-1800  . Warfarin - Pharmacist Dosing Inpatient   Does not apply q1800  . [DISCONTINUED] diltiazem  180 mg Oral Daily  . [DISCONTINUED] enoxaparin (LOVENOX) injection  30 mg Subcutaneous Q24H  . [DISCONTINUED]  simvastatin  20 mg Oral QPM   Assessment: 79 yo F admitted with symptomatic Afib is already on chronic warfarin 1mg  alternating with 2mg  daily.  INR is therapeutic on admission and today. INR today is trending up to high end of range.  No bleeding noted.   Goal of Therapy:  INR 2-3   Plan:  Coumadin 1mg  po x 1 tonight Daily INR D/C Lovenox (INR>2)  Margo Aye, Aadvik Roker A 07/04/2012,10:39 AM

## 2012-07-04 NOTE — Progress Notes (Signed)
INITIAL NUTRITION ASSESSMENT  DOCUMENTATION CODES Per approved criteria  -Not Applicable   INTERVENTION: 1. Ensure Complete BID between meals. Each supplement provides 350 kcal and 13 grams of protein.   NUTRITION DIAGNOSIS: Inadequate oral intake related to pain and nausea as evidenced by poor po intake per pt.   Goal: Pt to meet >/= 90% of estimated needs   Monitor:  Weight, po intake, I/O's,   Reason for Assessment: Malnutrition Screening Tool (MST=2)  79 y.o. female  Admitting Dx: atrial fibrillation with rapid ventricular response   ASSESSMENT: Pt is 79 yo female who presented to the ED c/o sudden, progressively worsening diarrhea (mulitple episodes). Onset started 7 days ago (06/26/2012) and symptoms include vomiting (x1), sob, left sided chest pain, left sided abdominal pain, headache and dizziness.  Symptoms have prevented her from eating or sleeping regularly this week. Pt also reports she fell two weeks ago and landed upon a hard surface, impacting directly at her left breast. She has felt abnormal ever since. Pt has PMH of depression, a fib, HYT, arthritis, stroke, GERD, sob, dysrhythmia, PNA, and anemia.   In weight history, pt was weighed twice yesterday (07/03/2012).  One weight was recorded as 124 lbs and one weight was recorded as 132 lbs.  Her weight now is recorded as 137 lbs.  RD spoke with pt regarding reported weight loss and decreased intake. Pt reports usual body weight of 125 lbs.  RD questions accuracy of current weight of 137 lbs.  Pt strongly feels as if she has lost weight. When speaking with pt, she reported decreased appetite and decreased intake over the past 2 weeks.  She said sometimes, she might even go 2 days without eating. Pt feels her decreased appetite is largely due to pain.  Today (07/04/2012), pt reported arm pain, a headache and overall that she was not feeling good.  Pt stated that she sometimes drinks Ensure at home and would be agreeable to  drinking them while admitted.   Height: Ht Readings from Last 1 Encounters:  07/03/12 5\' 2"  (1.575 m)    Weight: Wt Readings from Last 1 Encounters:  07/04/12 137 lb 9.1 oz (62.4 kg)    Ideal Body Weight: 110 lbs   % Ideal Body Weight: 124%  Wt Readings from Last 10 Encounters:  07/04/12 137 lb 9.1 oz (62.4 kg)  05/25/11 124 lb 1.9 oz (56.3 kg)  03/28/11 125 lb 12.8 oz (57.063 kg)  03/16/11 133 lb (60.328 kg)  01/19/10 132 lb (59.875 kg)    Usual Body Weight: 125 lbs   % Usual Body Weight: 109%  BMI:  Body mass index is 25.15 kg/(m^2). - Overweight   Estimated Nutritional Needs: Kcal: 1250-1450 Protein: 60-75 g Fluid: 1.5-1.8 L  Skin: intact  Diet Order: Cardiac  EDUCATION NEEDS: -No education needs identified at this time   Intake/Output Summary (Last 24 hours) at 07/04/12 0859 Last data filed at 07/04/12 0600  Gross per 24 hour  Intake 1179.67 ml  Output      0 ml  Net 1179.67 ml    Last BM: 07/03/2012    Labs:   Recent Labs Lab 07/03/12 1159 07/04/12 0442  NA 136 142  K 3.8 3.4*  CL 101 111  CO2 21 22  BUN 23 23  CREATININE 1.48* 1.29*  CALCIUM 10.1 8.4  GLUCOSE 94 86    CBG (last 3)  No results found for this basename: GLUCAP,  in the last 72 hours  Scheduled Meds: .  amitriptyline  25 mg Oral QHS  . atorvastatin  10 mg Oral q1800  . busPIRone  5 mg Oral BID  . Chlorhexidine Gluconate Cloth  6 each Topical Q0600  . diltiazem  60 mg Oral Q6H  . donepezil  5 mg Oral QHS  . metoprolol  25 mg Oral BID  . mupirocin ointment  1 application Nasal BID  . off the beat book   Does not apply Once  . pantoprazole  40 mg Oral Daily  . pneumococcal 23 valent vaccine  0.5 mL Intramuscular Tomorrow-1000  . Warfarin - Pharmacist Dosing Inpatient   Does not apply q1800    Continuous Infusions: . sodium chloride 100 mL/hr at 07/04/12 0600  . sodium chloride 75 mL/hr at 07/04/12 0054    Past Medical History  Diagnosis Date  . Hypertension    . Arthritis   . Shortness of breath   . Stroke   . GERD (gastroesophageal reflux disease)   . Dysrhythmia   . Pneumonia   . Depression   . Anemia     Past Surgical History  Procedure Laterality Date  . Back surgery    . Ovaries  removed    Belenda Cruise  Dietetic Intern Pager: [phone]

## 2012-07-04 NOTE — Progress Notes (Signed)
Report called to J. Mays, Charity fundraiser.

## 2012-07-04 NOTE — Progress Notes (Signed)
UR Chart Review Completed  

## 2012-07-05 ENCOUNTER — Inpatient Hospital Stay (HOSPITAL_COMMUNITY): Payer: Medicare Other

## 2012-07-05 DIAGNOSIS — I4891 Unspecified atrial fibrillation: Secondary | ICD-10-CM

## 2012-07-05 LAB — URINE CULTURE

## 2012-07-05 LAB — PROTIME-INR: Prothrombin Time: 24.6 seconds — ABNORMAL HIGH (ref 11.6–15.2)

## 2012-07-05 MED ORDER — WARFARIN SODIUM 2 MG PO TABS
2.0000 mg | ORAL_TABLET | Freq: Once | ORAL | Status: AC
  Administered 2012-07-05: 2 mg via ORAL
  Filled 2012-07-05: qty 1

## 2012-07-05 MED ORDER — ALTEPLASE 2 MG IJ SOLR
INTRAMUSCULAR | Status: AC
  Filled 2012-07-05: qty 4

## 2012-07-05 NOTE — Progress Notes (Signed)
*  PRELIMINARY RESULTS* Echocardiogram 2D Echocardiogram has been performed.  Sheri Matthews [place] 07/05/2012, 11:03 AM

## 2012-07-05 NOTE — Progress Notes (Signed)
Subjective: She's complaining of severe left chest pain this morning. This is similar to what she had been on admission but she says it's worse. I think is probably related to her fall and chest wall trauma.  Objective: Vital signs in last 24 hours: Temp:  [97.1 F (36.2 C)-98 F (36.7 C)] 97.2 F (36.2 C) (02/21 0541) Pulse Rate:  [56-88] 76 (02/21 0541) Resp:  [18-22] 20 (02/21 0541) BP: (99-157)/(53-97) 157/97 mmHg (02/21 0541) SpO2:  [96 %-100 %] 96 % (02/21 0541) Weight change:  Last BM Date: 07/03/12  Intake/Output from previous day: 02/20 0701 - 02/21 0700 In: 5846.5 [P.O.:120; I.V.:5726.5] Out: 300 [Urine:300]  PHYSICAL EXAM General appearance: alert, cooperative, mild distress and moderate distress Resp: clear to auscultation bilaterally Cardio: irregularly irregular rhythm GI: soft, non-tender; bowel sounds normal; no masses,  no organomegaly Extremities: extremities normal, atraumatic, no cyanosis or edema  Lab Results:    Basic Metabolic Panel:  Recent Labs  40/98/11 1159 07/04/12 0442  NA 136 142  K 3.8 3.4*  CL 101 111  CO2 21 22  GLUCOSE 94 86  BUN 23 23  CREATININE 1.48* 1.29*  CALCIUM 10.1 8.4   Liver Function Tests:  Recent Labs  07/03/12 1159  AST 31  ALT 19  ALKPHOS 104  BILITOT 0.6  PROT 7.4  ALBUMIN 4.0    Recent Labs  07/03/12 1159  LIPASE 48   No results found for this basename: AMMONIA,  in the last 72 hours CBC:  Recent Labs  07/03/12 1159 07/04/12 0442  WBC 9.6 8.4  NEUTROABS 7.4  --   HGB 13.5 11.1*  HCT 40.2 34.5*  MCV 91.8 94.5  PLT 369 323   Cardiac Enzymes: No results found for this basename: CKTOTAL, CKMB, CKMBINDEX, TROPONINI,  in the last 72 hours BNP:  Recent Labs  07/03/12 1159  PROBNP 4515.0*   D-Dimer: No results found for this basename: DDIMER,  in the last 72 hours CBG: No results found for this basename: GLUCAP,  in the last 72 hours Hemoglobin A1C: No results found for this  basename: HGBA1C,  in the last 72 hours Fasting Lipid Panel: No results found for this basename: CHOL, HDL, LDLCALC, TRIG, CHOLHDL, LDLDIRECT,  in the last 72 hours Thyroid Function Tests: No results found for this basename: TSH, T4TOTAL, FREET4, T3FREE, THYROIDAB,  in the last 72 hours Anemia Panel: No results found for this basename: VITAMINB12, FOLATE, FERRITIN, TIBC, IRON, RETICCTPCT,  in the last 72 hours Coagulation:  Recent Labs  07/04/12 0442 07/05/12 0601  LABPROT 27.1* 24.6*  INR 2.67* 2.34*   Urine Drug Screen: Drugs of Abuse  No results found for this basename: labopia, cocainscrnur, labbenz, amphetmu, thcu, labbarb    Alcohol Level: No results found for this basename: ETH,  in the last 72 hours Urinalysis:  Recent Labs  07/03/12 1207  COLORURINE YELLOW  LABSPEC 1.025  PHURINE 6.0  GLUCOSEU NEGATIVE  HGBUR NEGATIVE  BILIRUBINUR MODERATE*  KETONESUR 15*  PROTEINUR 100*  UROBILINOGEN 0.2  NITRITE NEGATIVE  LEUKOCYTESUR TRACE*   Misc. Labs:  ABGS No results found for this basename: PHART, PCO2, PO2ART, TCO2, HCO3,  in the last 72 hours CULTURES Recent Results (from the past 240 hour(s))  URINE CULTURE     Status: None   Collection Time    07/03/12 12:07 PM      Result Value Range Status   Specimen Description URINE, CLEAN CATCH   Final   Special Requests NONE  Final   Culture  Setup Time 07/04/2012 06:58   Final   Colony Count >=100,000 COLONIES/ML   Final   Culture ESCHERICHIA COLI   Final   Report Status PENDING   Incomplete  MRSA PCR SCREENING     Status: Abnormal   Collection Time    07/03/12  4:08 PM      Result Value Range Status   MRSA by PCR POSITIVE (*) NEGATIVE Final   Comment:            The GeneXpert MRSA Assay (FDA     approved for NASAL specimens     only), is one component of a     comprehensive MRSA colonization     surveillance program. It is not     intended to diagnose MRSA     infection nor to guide or     monitor  treatment for     MRSA infections.     RESULT CALLED TO, READ BACK BY AND VERIFIED WITH:     MABE,M. AT 1940 ON 06/28/2012 BY BAUGHAM,M.   Studies/Results: Dg Ribs Unilateral W/chest Left  07/03/2012  *RADIOLOGY REPORT*  Clinical Data: Anxiety.  Loss of appetite. Recent fall.  Left anterior rib pain  LEFT RIBS AND CHEST - 3+ VIEW  Comparison: 05/23/2011  Findings: Heart is upper limits normal in size.  Lungs are clear. No effusions or pneumothorax.  No acute bony abnormality.  No visible rib fracture.  IMPRESSION: No acute cardiopulmonary disease.   Original Report Authenticated By: Charlett Nose, M.D.     Medications:  Prior to Admission:  Prescriptions prior to admission  Medication Sig Dispense Refill  . amitriptyline (ELAVIL) 25 MG tablet Take 25 mg by mouth at bedtime.      . busPIRone (BUSPAR) 5 MG tablet Take 5 mg by mouth 2 (two) times daily.      Marland Kitchen diltiazem (CARDIZEM CD) 180 MG 24 hr capsule Take 1 capsule (180 mg total) by mouth daily.  30 capsule  2  . donepezil (ARICEPT) 5 MG tablet Take 5 mg by mouth at bedtime.       Marland Kitchen HYDROcodone-acetaminophen (NORCO/VICODIN) 5-325 MG per tablet Take 1 tablet by mouth every 6 (six) hours as needed for pain.      . metoprolol (LOPRESSOR) 50 MG tablet Take 25 mg by mouth 2 (two) times daily.       Marland Kitchen omeprazole (PRILOSEC) 20 MG capsule Take 20 mg by mouth daily.      . simvastatin (ZOCOR) 20 MG tablet Take 20 mg by mouth every evening.      . warfarin (COUMADIN) 2 MG tablet Take 1-2 mg by mouth daily. Alternates every other day.       Scheduled: . amitriptyline  25 mg Oral QHS  . atorvastatin  10 mg Oral q1800  . busPIRone  5 mg Oral BID  . Chlorhexidine Gluconate Cloth  6 each Topical Q0600  . diltiazem  60 mg Oral Q6H  . donepezil  5 mg Oral QHS  . feeding supplement  237 mL Oral BID BM  . metoprolol  25 mg Oral BID  . mupirocin ointment  1 application Nasal BID  . pantoprazole  40 mg Oral Daily  . Warfarin - Pharmacist Dosing  Inpatient   Does not apply q1800   Continuous: . sodium chloride 100 mL/hr at 07/04/12 2053  . sodium chloride 75 mL/hr at 07/04/12 0054   GNF:AOZHYQMVHQION, acetaminophen, alum & mag hydroxide-simeth, HYDROcodone-acetaminophen, ondansetron (ZOFRAN) IV,  ondansetron  Assesment: She has left chest pain that seems to be chest wall in nature. She had chest x-ray did not have any fractures but I'm concerned now that she may have a pneumonia from splinting on that side. She has atrial fibrillation which is pretty stable. She had rapid ventricular response on admission but now she is controlled Active Problems:   * No active hospital problems. *    Plan: She has hydrocodone ordered for pain and has taken it does. It has not had time to work yet. She will have a chest x-ray.    LOS: 2 days   Sheri Matthews 07/05/2012, 8:51 AM

## 2012-07-05 NOTE — Progress Notes (Signed)
ANTICOAGULATION CONSULT NOTE   Pharmacy Consult for Coumadin Indication: atrial fibrillation  No Known Allergies  Patient Measurements: Height: 5\' 2"  (157.5 cm) Weight: 137 lb 9.1 oz (62.4 kg) IBW/kg (Calculated) : 50.1  Vital Signs: Temp: 97.2 F (36.2 C) (02/21 0541) Temp src: Oral (02/21 0541) BP: 157/97 mmHg (02/21 0541) Pulse Rate: 76 (02/21 0541)  Labs:  Recent Labs  07/03/12 1159 07/04/12 0442 07/05/12 0601  HGB 13.5 11.1*  --   HCT 40.2 34.5*  --   PLT 369 323  --   LABPROT 23.7* 27.1* 24.6*  INR 2.23* 2.67* 2.34*  CREATININE 1.48* 1.29*  --    Estimated Creatinine Clearance: 27.2 ml/min (by C-G formula based on Cr of 1.29).  Medical History: Past Medical History  Diagnosis Date  . Hypertension   . Arthritis   . Shortness of breath   . Stroke   . GERD (gastroesophageal reflux disease)   . Dysrhythmia   . Pneumonia   . Depression   . Anemia    Medications:  Scheduled:  . amitriptyline  25 mg Oral QHS  . atorvastatin  10 mg Oral q1800  . busPIRone  5 mg Oral BID  . Chlorhexidine Gluconate Cloth  6 each Topical Q0600  . diltiazem  60 mg Oral Q6H  . donepezil  5 mg Oral QHS  . feeding supplement  237 mL Oral BID BM  . metoprolol  25 mg Oral BID  . mupirocin ointment  1 application Nasal BID  . [COMPLETED] off the beat book   Does not apply Once  . pantoprazole  40 mg Oral Daily  . [COMPLETED] pneumococcal 23 valent vaccine  0.5 mL Intramuscular Tomorrow-1000  . [COMPLETED] warfarin  1 mg Oral ONCE-1800  . Warfarin - Pharmacist Dosing Inpatient   Does not apply q1800   Assessment: 79 yo F admitted with symptomatic Afib is already on chronic warfarin 1mg  alternating with 2mg  daily.  INR was therapeutic on admission and has remained in goal range. No bleeding noted.   Goal of Therapy:  INR 2-3   Plan:  Coumadin 2mg  po x 1 tonight Daily INR  Marguerite Barba, [facility] 07/05/2012,10:19 AM

## 2012-07-06 LAB — PROTIME-INR
INR: 1.92 — ABNORMAL HIGH (ref 0.00–1.49)
Prothrombin Time: 21.2 seconds — ABNORMAL HIGH (ref 11.6–15.2)

## 2012-07-06 MED ORDER — WARFARIN SODIUM 2.5 MG PO TABS: 2.5000 mg | ORAL_TABLET | Freq: Once | ORAL | Status: DC

## 2012-07-06 MED ORDER — DILTIAZEM HCL ER COATED BEADS 240 MG PO CP24: 240.0000 mg | ORAL_CAPSULE | Freq: Every day | ORAL | Status: DC

## 2012-07-06 NOTE — Progress Notes (Signed)
D/c instructions reviewed with patient and daughter.  Verbalized understanding.  Pt dc'd to home with daughter.  Schonewitz, Candelaria Stagers 07/06/2012

## 2012-07-06 NOTE — Progress Notes (Signed)
Subjective: She says she feels much better. She has no new complaints. Her chest pain has resolved. She wants to go home  Objective: Vital signs in last 24 hours: Temp:  [97.8 F (36.6 C)-98.2 F (36.8 C)] 98 F (36.7 C) (02/22 0518) Pulse Rate:  [64-80] 80 (02/22 0518) Resp:  [18-19] 18 (02/22 0518) BP: (135-192)/(77-122) 163/88 mmHg (02/22 0605) SpO2:  [95 %-98 %] 95 % (02/22 0518) Weight:  [61.6 kg (135 lb 12.9 oz)] 61.6 kg (135 lb 12.9 oz) (02/22 0518) Weight change:  Last BM Date: 07/03/12  Intake/Output from previous day: 02/21 0701 - 02/22 0700 In: 600 [P.O.:600] Out: 1950 [Urine:1950]  PHYSICAL EXAM General appearance: alert, cooperative and no distress Resp: clear to auscultation bilaterally Cardio: irregularly irregular rhythm GI: soft, non-tender; bowel sounds normal; no masses,  no organomegaly Extremities: extremities normal, atraumatic, no cyanosis or edema  Lab Results:    Basic Metabolic Panel:  Recent Labs  45/40/98 1159 07/04/12 0442  NA 136 142  K 3.8 3.4*  CL 101 111  CO2 21 22  GLUCOSE 94 86  BUN 23 23  CREATININE 1.48* 1.29*  CALCIUM 10.1 8.4   Liver Function Tests:  Recent Labs  07/03/12 1159  AST 31  ALT 19  ALKPHOS 104  BILITOT 0.6  PROT 7.4  ALBUMIN 4.0    Recent Labs  07/03/12 1159  LIPASE 48   No results found for this basename: AMMONIA,  in the last 72 hours CBC:  Recent Labs  07/03/12 1159 07/04/12 0442  WBC 9.6 8.4  NEUTROABS 7.4  --   HGB 13.5 11.1*  HCT 40.2 34.5*  MCV 91.8 94.5  PLT 369 323   Cardiac Enzymes: No results found for this basename: CKTOTAL, CKMB, CKMBINDEX, TROPONINI,  in the last 72 hours BNP:  Recent Labs  07/03/12 1159  PROBNP 4515.0*   D-Dimer: No results found for this basename: DDIMER,  in the last 72 hours CBG: No results found for this basename: GLUCAP,  in the last 72 hours Hemoglobin A1C: No results found for this basename: HGBA1C,  in the last 72 hours Fasting Lipid  Panel: No results found for this basename: CHOL, HDL, LDLCALC, TRIG, CHOLHDL, LDLDIRECT,  in the last 72 hours Thyroid Function Tests: No results found for this basename: TSH, T4TOTAL, FREET4, T3FREE, THYROIDAB,  in the last 72 hours Anemia Panel: No results found for this basename: VITAMINB12, FOLATE, FERRITIN, TIBC, IRON, RETICCTPCT,  in the last 72 hours Coagulation:  Recent Labs  07/05/12 0601 07/06/12 0603  LABPROT 24.6* 21.2*  INR 2.34* 1.92*   Urine Drug Screen: Drugs of Abuse  No results found for this basename: labopia, cocainscrnur, labbenz, amphetmu, thcu, labbarb    Alcohol Level: No results found for this basename: ETH,  in the last 72 hours Urinalysis:  Recent Labs  07/03/12 1207  COLORURINE YELLOW  LABSPEC 1.025  PHURINE 6.0  GLUCOSEU NEGATIVE  HGBUR NEGATIVE  BILIRUBINUR MODERATE*  KETONESUR 15*  PROTEINUR 100*  UROBILINOGEN 0.2  NITRITE NEGATIVE  LEUKOCYTESUR TRACE*   Misc. Labs:  ABGS No results found for this basename: PHART, PCO2, PO2ART, TCO2, HCO3,  in the last 72 hours CULTURES Recent Results (from the past 240 hour(s))  URINE CULTURE     Status: None   Collection Time    07/03/12 12:07 PM      Result Value Range Status   Specimen Description URINE, CLEAN CATCH   Final   Special Requests NONE   Final  Culture  Setup Time 07/04/2012 06:58   Final   Colony Count >=100,000 COLONIES/ML   Final   Culture ESCHERICHIA COLI   Final   Report Status 07/05/2012 FINAL   Final   Organism ID, Bacteria ESCHERICHIA COLI   Final  MRSA PCR SCREENING     Status: Abnormal   Collection Time    07/03/12  4:08 PM      Result Value Range Status   MRSA by PCR POSITIVE (*) NEGATIVE Final   Comment:            The GeneXpert MRSA Assay (FDA     approved for NASAL specimens     only), is one component of a     comprehensive MRSA colonization     surveillance program. It is not     intended to diagnose MRSA     infection nor to guide or     monitor  treatment for     MRSA infections.     RESULT CALLED TO, READ BACK BY AND VERIFIED WITH:     MABE,M. AT 1940 ON 06/28/2012 BY BAUGHAM,M.   Studies/Results: Dg Chest 2 View  07/05/2012  *RADIOLOGY REPORT*  Clinical Data: Recurrent left chest pain.  CHEST - 2 VIEW  Comparison: 07/03/2012  Findings: Mild peribronchial thickening.  Heart is normal size.  No focal airspace opacities or effusions.  No acute bony abnormality.  IMPRESSION: Mild bronchitic changes.   Original Report Authenticated By: Charlett Nose, M.D.     Medications:  Prior to Admission:  Prescriptions prior to admission  Medication Sig Dispense Refill  . amitriptyline (ELAVIL) 25 MG tablet Take 25 mg by mouth at bedtime.      . busPIRone (BUSPAR) 5 MG tablet Take 5 mg by mouth 2 (two) times daily.      Marland Kitchen diltiazem (CARDIZEM CD) 180 MG 24 hr capsule Take 1 capsule (180 mg total) by mouth daily.  30 capsule  2  . donepezil (ARICEPT) 5 MG tablet Take 5 mg by mouth at bedtime.       Marland Kitchen HYDROcodone-acetaminophen (NORCO/VICODIN) 5-325 MG per tablet Take 1 tablet by mouth every 6 (six) hours as needed for pain.      . metoprolol (LOPRESSOR) 50 MG tablet Take 25 mg by mouth 2 (two) times daily.       Marland Kitchen omeprazole (PRILOSEC) 20 MG capsule Take 20 mg by mouth daily.      . simvastatin (ZOCOR) 20 MG tablet Take 20 mg by mouth every evening.      . warfarin (COUMADIN) 2 MG tablet Take 1-2 mg by mouth daily. Alternates every other day.       Scheduled: . amitriptyline  25 mg Oral QHS  . atorvastatin  10 mg Oral q1800  . busPIRone  5 mg Oral BID  . Chlorhexidine Gluconate Cloth  6 each Topical Q0600  . diltiazem  60 mg Oral Q6H  . donepezil  5 mg Oral QHS  . feeding supplement  237 mL Oral BID BM  . metoprolol  25 mg Oral BID  . mupirocin ointment  1 application Nasal BID  . pantoprazole  40 mg Oral Daily  . warfarin  2.5 mg Oral ONCE-1800  . Warfarin - Pharmacist Dosing Inpatient   Does not apply q1800   Continuous: . sodium  chloride 100 mL/hr at 07/05/12 1157  . sodium chloride 75 mL/hr at 07/04/12 0054   ZOX:[id number], acetaminophen, alum & mag hydroxide-simeth, HYDROcodone-acetaminophen, ondansetron (ZOFRAN) IV, ondansetron  Assesment: She has atrial fibrillation with rapid ventricular response. She has hypertension and her blood pressure has still been up somewhat. She had left-sided chest wall pain which seems to be related to a fall. Principal Problem:   Atrial fibrillation with rapid ventricular response Active Problems:   PUD   Essential hypertension, benign   Left-sided chest wall pain    Plan: I think she can safely be discharged home today    LOS: 3 days   Deanna Wiater L 07/06/2012, 9:30 AM

## 2012-07-06 NOTE — Progress Notes (Signed)
ANTICOAGULATION CONSULT NOTE   Pharmacy Consult for Coumadin Indication: atrial fibrillation  No Known Allergies  Patient Measurements: Height: 5\' 2"  (157.5 cm) Weight: 135 lb 12.9 oz (61.6 kg) IBW/kg (Calculated) : 50.1  Vital Signs: Temp: 98 F (36.7 C) (02/22 0518) Temp src: Oral (02/22 0518) BP: 163/88 mmHg (02/22 0605) Pulse Rate: 80 (02/22 0518)  Labs:  Recent Labs  07/03/12 1159 07/04/12 0442 07/05/12 0601 07/06/12 0603  HGB 13.5 11.1*  --   --   HCT 40.2 34.5*  --   --   PLT 369 323  --   --   LABPROT 23.7* 27.1* 24.6* 21.2*  INR 2.23* 2.67* 2.34* 1.92*  CREATININE 1.48* 1.29*  --   --    Estimated Creatinine Clearance: 27 ml/min (by C-G formula based on Cr of 1.29).  Medical History: Past Medical History  Diagnosis Date  . Hypertension   . Arthritis   . Shortness of breath   . Stroke   . GERD (gastroesophageal reflux disease)   . Dysrhythmia   . Pneumonia   . Depression   . Anemia    Medications:  Scheduled:  . amitriptyline  25 mg Oral QHS  . atorvastatin  10 mg Oral q1800  . busPIRone  5 mg Oral BID  . Chlorhexidine Gluconate Cloth  6 each Topical Q0600  . diltiazem  60 mg Oral Q6H  . donepezil  5 mg Oral QHS  . feeding supplement  237 mL Oral BID BM  . metoprolol  25 mg Oral BID  . mupirocin ointment  1 application Nasal BID  . pantoprazole  40 mg Oral Daily  . [COMPLETED] warfarin  2 mg Oral ONCE-1800  . Warfarin - Pharmacist Dosing Inpatient   Does not apply q1800   Assessment: 79 yo F admitted with symptomatic Afib is already on chronic warfarin 1mg  alternating with 2mg  daily.  INR was therapeutic on admission but slightly below goal range today  Goal of Therapy:  INR 2-3   Plan:  Coumadin 2.5  po x 1 tonight , then return to home dosing in AM Daily INR  Sheri Matthews, Sheri Matthews 07/06/2012,8:09 AM

## 2012-07-08 NOTE — Discharge Summary (Signed)
Physician Discharge Summary  Patient ID: Sheri Matthews MRN: [id number] DOB/AGE: 77/77/1925 77 y.o. Primary Care Physician:Onix Jumper L, MD Admit date: 07/03/2012 Discharge date: 07/08/2012    Discharge Diagnoses:   Principal Problem:   Atrial fibrillation with rapid ventricular response Active Problems:   PUD   Essential hypertension, benign   Left-sided chest wall pain     Medication List    TAKE these medications       amitriptyline 25 MG tablet  Commonly known as:  ELAVIL  Take 25 mg by mouth at bedtime.     busPIRone 5 MG tablet  Commonly known as:  BUSPAR  Take 5 mg by mouth 2 (two) times daily.     diltiazem 240 MG 24 hr capsule  Commonly known as:  CARTIA XT  Take 1 capsule (240 mg total) by mouth daily.     donepezil 5 MG tablet  Commonly known as:  ARICEPT  Take 5 mg by mouth at bedtime.     HYDROcodone-acetaminophen 5-325 MG per tablet  Commonly known as:  NORCO/VICODIN  Take 1 tablet by mouth every 6 (six) hours as needed for pain.     metoprolol 50 MG tablet  Commonly known as:  LOPRESSOR  Take 25 mg by mouth 2 (two) times daily.     omeprazole 20 MG capsule  Commonly known as:  PRILOSEC  Take 20 mg by mouth daily.     simvastatin 20 MG tablet  Commonly known as:  ZOCOR  Take 20 mg by mouth every evening.     warfarin 2 MG tablet  Commonly known as:  COUMADIN  Take 1-2 mg by mouth daily. Alternates every other day.        Discharged Condition: Improved    Consults: none  Significant Diagnostic Studies: Dg Chest 2 View  07/05/2012  *RADIOLOGY REPORT*  Clinical Data: Recurrent left chest pain.  CHEST - 2 VIEW  Comparison: 07/03/2012  Findings: Mild peribronchial thickening.  Heart is normal size.  No focal airspace opacities or effusions.  No acute bony abnormality.  IMPRESSION: Mild bronchitic changes.   Original Report Authenticated By: Charlett Nose, M.D.    Dg Ribs Unilateral W/chest Left  07/03/2012  *RADIOLOGY REPORT*   Clinical Data: Anxiety.  Loss of appetite. Recent fall.  Left anterior rib pain  LEFT RIBS AND CHEST - 3+ VIEW  Comparison: 05/23/2011  Findings: Heart is upper limits normal in size.  Lungs are clear. No effusions or pneumothorax.  No acute bony abnormality.  No visible rib fracture.  IMPRESSION: No acute cardiopulmonary disease.   Original Report Authenticated By: Charlett Nose, M.D.     Lab Results: Basic Metabolic Panel: No results found for this basename: NA, K, CL, CO2, GLUCOSE, BUN, CREATININE, CALCIUM, MG, PHOS,  in the last 72 hours Liver Function Tests: No results found for this basename: AST, ALT, ALKPHOS, BILITOT, PROT, ALBUMIN,  in the last 72 hours   CBC: No results found for this basename: WBC, NEUTROABS, HGB, HCT, MCV, PLT,  in the last 72 hours  Recent Results (from the past 240 hour(s))  URINE CULTURE     Status: None   Collection Time    07/03/12 12:07 PM      Result Value Range Status   Specimen Description URINE, CLEAN CATCH   Final   Special Requests NONE   Final   Culture  Setup Time 07/04/2012 06:58   Final   Colony Count >=100,000 COLONIES/ML   Final   Culture  ESCHERICHIA COLI   Final   Report Status 07/05/2012 FINAL   Final   Organism ID, Bacteria ESCHERICHIA COLI   Final  MRSA PCR SCREENING     Status: Abnormal   Collection Time    07/03/12  4:08 PM      Result Value Range Status   MRSA by PCR POSITIVE (*) NEGATIVE Final   Comment:            The GeneXpert MRSA Assay (FDA     approved for NASAL specimens     only), is one component of a     comprehensive MRSA colonization     surveillance program. It is not     intended to diagnose MRSA     infection nor to guide or     monitor treatment for     MRSA infections.     RESULT CALLED TO, READ BACK BY AND VERIFIED WITH:     MABE,M. AT 1940 ON 06/28/2012 BY BAUGHAM,M.     [facility] Course: She was admitted with atrial fibrillation with rapid ventricular response. She had been sick with a gastrointestinal  illness and apparently had some trouble keeping her medications down. When she came to the emergency room she had a heart rate of about 150. She ruled out for myocardial infarction. She responded well to diltiazem. She was initially started on diltiazem intravenously and her heart rate came down into the 80s. Her baseline diltiazem dose was increased from 180 mg to 240 mg daily. She initially was given short-acting diltiazem and at the time of discharge was switched to long-acting. She complained of left chest pain which is thought to be chest wall pain.  Discharge Exam: Blood pressure 163/88, pulse 80, temperature 98 F (36.7 C), temperature source Oral, resp. rate 18, height 5\' 2"  (1.575 m), weight 61.6 kg (135 lb 12.9 oz), SpO2 95.00%. She is awake and alert. Her pulse is about 80 and irregular. Her chest is clear. Her heart is regular.  Disposition: Home. She has multiple family members who help provide her care.      SignedFredirick Maudlin Pager [phone]  07/08/2012, 7:19 AM

## 2013-04-07 ENCOUNTER — Inpatient Hospital Stay (HOSPITAL_COMMUNITY)
Admission: AD | Admit: 2013-04-07 | Discharge: 2013-04-11 | DRG: 683 | Disposition: A | Discharge status: Skilled Nursing Facility | Payer: Medicare Other | Source: Ambulatory Visit | Attending: Pulmonary Disease | Admitting: Pulmonary Disease

## 2013-04-07 ENCOUNTER — Encounter (HOSPITAL_COMMUNITY): Payer: Self-pay | Admitting: *Deleted

## 2013-04-07 ENCOUNTER — Inpatient Hospital Stay (HOSPITAL_COMMUNITY): Payer: Medicare Other

## 2013-04-07 DIAGNOSIS — S52599A Other fractures of lower end of unspecified radius, initial encounter for closed fracture: Secondary | ICD-10-CM | POA: Diagnosis present

## 2013-04-07 DIAGNOSIS — F03918 Unspecified dementia, unspecified severity, with other behavioral disturbance: Secondary | ICD-10-CM | POA: Diagnosis present

## 2013-04-07 DIAGNOSIS — I10 Essential (primary) hypertension: Secondary | ICD-10-CM | POA: Diagnosis present

## 2013-04-07 DIAGNOSIS — R627 Adult failure to thrive: Secondary | ICD-10-CM | POA: Diagnosis present

## 2013-04-07 DIAGNOSIS — Y92009 Unspecified place in unspecified non-institutional (private) residence as the place of occurrence of the external cause: Secondary | ICD-10-CM

## 2013-04-07 DIAGNOSIS — F329 Major depressive disorder, single episode, unspecified: Secondary | ICD-10-CM | POA: Diagnosis present

## 2013-04-07 DIAGNOSIS — Z7901 Long term (current) use of anticoagulants: Secondary | ICD-10-CM

## 2013-04-07 DIAGNOSIS — Z23 Encounter for immunization: Secondary | ICD-10-CM

## 2013-04-07 DIAGNOSIS — N179 Acute kidney failure, unspecified: Principal | ICD-10-CM | POA: Diagnosis present

## 2013-04-07 DIAGNOSIS — D649 Anemia, unspecified: Secondary | ICD-10-CM | POA: Diagnosis present

## 2013-04-07 DIAGNOSIS — Z9181 History of falling: Secondary | ICD-10-CM

## 2013-04-07 DIAGNOSIS — F3289 Other specified depressive episodes: Secondary | ICD-10-CM | POA: Diagnosis present

## 2013-04-07 DIAGNOSIS — S52509A Unspecified fracture of the lower end of unspecified radius, initial encounter for closed fracture: Secondary | ICD-10-CM

## 2013-04-07 DIAGNOSIS — W19XXXA Unspecified fall, initial encounter: Secondary | ICD-10-CM | POA: Diagnosis present

## 2013-04-07 DIAGNOSIS — M129 Arthropathy, unspecified: Secondary | ICD-10-CM | POA: Diagnosis present

## 2013-04-07 DIAGNOSIS — E860 Dehydration: Secondary | ICD-10-CM | POA: Diagnosis present

## 2013-04-07 DIAGNOSIS — I4891 Unspecified atrial fibrillation: Secondary | ICD-10-CM | POA: Diagnosis present

## 2013-04-07 DIAGNOSIS — Z8673 Personal history of transient ischemic attack (TIA), and cerebral infarction without residual deficits: Secondary | ICD-10-CM

## 2013-04-07 DIAGNOSIS — I951 Orthostatic hypotension: Secondary | ICD-10-CM | POA: Diagnosis present

## 2013-04-07 DIAGNOSIS — F0391 Unspecified dementia with behavioral disturbance: Secondary | ICD-10-CM | POA: Diagnosis present

## 2013-04-07 DIAGNOSIS — R296 Repeated falls: Secondary | ICD-10-CM

## 2013-04-07 DIAGNOSIS — K219 Gastro-esophageal reflux disease without esophagitis: Secondary | ICD-10-CM | POA: Diagnosis present

## 2013-04-07 HISTORY — DX: Unspecified dementia with behavioral disturbance: F03.91

## 2013-04-07 HISTORY — DX: Acute kidney failure, unspecified: N17.9

## 2013-04-07 LAB — CBC WITH DIFFERENTIAL/PLATELET
Eosinophils Relative: 3 % (ref 0–5)
HCT: 40.1 % (ref 36.0–46.0)
Hemoglobin: 13 g/dL (ref 12.0–15.0)
Lymphocytes Relative: 18 % (ref 12–46)
Lymphs Abs: 1.8 10*3/uL (ref 0.7–4.0)
MCH: 30.3 pg (ref 26.0–34.0)
MCV: 93.5 fL (ref 78.0–100.0)
Monocytes Relative: 8 % (ref 3–12)
Neutrophils Relative %: 71 % (ref 43–77)
Platelets: 349 10*3/uL (ref 150–400)
RBC: 4.29 MIL/uL (ref 3.87–5.11)
WBC: 10.3 10*3/uL (ref 4.0–10.5)

## 2013-04-07 LAB — COMPREHENSIVE METABOLIC PANEL
ALT: 10 U/L (ref 0–35)
AST: 16 U/L (ref 0–37)
Albumin: 3.5 g/dL (ref 3.5–5.2)
Alkaline Phosphatase: 89 U/L (ref 39–117)
Calcium: 9.5 mg/dL (ref 8.4–10.5)
Chloride: 97 mEq/L (ref 96–112)
GFR calc Af Amer: 13 mL/min — ABNORMAL LOW (ref >90)
Glucose, Bld: 101 mg/dL — ABNORMAL HIGH (ref 70–99)
Potassium: 3.5 mEq/L (ref 3.5–5.1)
Sodium: 137 mEq/L (ref 135–145)
Total Protein: 7.1 g/dL (ref 6.0–8.3)

## 2013-04-07 LAB — PROTIME-INR
INR: 2.45 — ABNORMAL HIGH (ref 0.00–1.49)
Prothrombin Time: 25.8 seconds — ABNORMAL HIGH (ref 11.6–15.2)

## 2013-04-07 LAB — MRSA PCR SCREENING: MRSA by PCR: POSITIVE — AB

## 2013-04-07 MED ORDER — ONDANSETRON HCL 4 MG/2ML IJ SOLN: 4.0000 mg | Freq: Four times a day (QID) | INTRAMUSCULAR | Status: DC | PRN

## 2013-04-07 MED ORDER — METOPROLOL TARTRATE 25 MG PO TABS
25.0000 mg | ORAL_TABLET | Freq: Two times a day (BID) | ORAL | Status: DC
  Administered 2013-04-08 – 2013-04-11 (×7): 25 mg via ORAL
  Filled 2013-04-07 (×8): qty 1

## 2013-04-07 MED ORDER — SIMVASTATIN 20 MG PO TABS: 20.0000 mg | ORAL_TABLET | Freq: Every evening | ORAL | Status: DC

## 2013-04-07 MED ORDER — PANTOPRAZOLE SODIUM 40 MG PO TBEC
40.0000 mg | DELAYED_RELEASE_TABLET | Freq: Every day | ORAL | Status: DC
  Administered 2013-04-08 – 2013-04-11 (×4): 40 mg via ORAL
  Filled 2013-04-07 (×4): qty 1

## 2013-04-07 MED ORDER — BUSPIRONE HCL 5 MG PO TABS
5.0000 mg | ORAL_TABLET | Freq: Two times a day (BID) | ORAL | Status: DC
  Administered 2013-04-07 – 2013-04-11 (×8): 5 mg via ORAL
  Filled 2013-04-07 (×8): qty 1

## 2013-04-07 MED ORDER — SODIUM CHLORIDE 0.9 % IJ SOLN
3.0000 mL | Freq: Two times a day (BID) | INTRAMUSCULAR | Status: DC
  Administered 2013-04-10: 3 mL via INTRAVENOUS

## 2013-04-07 MED ORDER — ALUM & MAG HYDROXIDE-SIMETH 200-200-20 MG/5ML PO SUSP
30.0000 mL | Freq: Four times a day (QID) | ORAL | Status: DC | PRN
Start: 2013-04-07 — End: 2013-04-11

## 2013-04-07 MED ORDER — ACETAMINOPHEN 650 MG RE SUPP: 650.0000 mg | Freq: Four times a day (QID) | RECTAL | Status: DC | PRN

## 2013-04-07 MED ORDER — INFLUENZA VAC SPLIT QUAD 0.5 ML IM SUSP
0.5000 mL | INTRAMUSCULAR | Status: AC
  Administered 2013-04-08: 0.5 mL via INTRAMUSCULAR
  Filled 2013-04-07: qty 0.5

## 2013-04-07 MED ORDER — ACETAMINOPHEN 325 MG PO TABS
650.0000 mg | ORAL_TABLET | Freq: Four times a day (QID) | ORAL | Status: DC | PRN
  Administered 2013-04-07: 650 mg via ORAL
  Filled 2013-04-07: qty 2

## 2013-04-07 MED ORDER — HYDROCODONE-ACETAMINOPHEN 5-325 MG PO TABS
1.0000 | ORAL_TABLET | ORAL | Status: DC | PRN
  Administered 2013-04-07: 1 via ORAL
  Administered 2013-04-08 – 2013-04-11 (×9): 2 via ORAL
  Filled 2013-04-07 (×6): qty 2
  Filled 2013-04-07: qty 1
  Filled 2013-04-07 (×2): qty 2
  Filled 2013-04-07: qty 1
  Filled 2013-04-07: qty 2

## 2013-04-07 MED ORDER — ONDANSETRON HCL 4 MG PO TABS: 4.0000 mg | ORAL_TABLET | Freq: Four times a day (QID) | ORAL | Status: DC | PRN

## 2013-04-07 MED ORDER — DILTIAZEM HCL ER COATED BEADS 240 MG PO CP24
240.0000 mg | ORAL_CAPSULE | Freq: Every day | ORAL | Status: DC
  Administered 2013-04-08: 240 mg via ORAL
  Filled 2013-04-07: qty 1

## 2013-04-07 MED ORDER — ATORVASTATIN CALCIUM 10 MG PO TABS
10.0000 mg | ORAL_TABLET | Freq: Every day | ORAL | Status: DC
  Administered 2013-04-07 – 2013-04-10 (×4): 10 mg via ORAL
  Filled 2013-04-07 (×3): qty 1

## 2013-04-07 MED ORDER — ATORVASTATIN CALCIUM 10 MG PO TABS: 10.0000 mg | ORAL_TABLET | Freq: Every day | ORAL | Status: DC

## 2013-04-07 MED ORDER — DONEPEZIL HCL 5 MG PO TABS
5.0000 mg | ORAL_TABLET | Freq: Every day | ORAL | Status: DC
  Administered 2013-04-07 – 2013-04-10 (×4): 5 mg via ORAL
  Filled 2013-04-07 (×4): qty 1

## 2013-04-07 MED ORDER — AMITRIPTYLINE HCL 25 MG PO TABS
25.0000 mg | ORAL_TABLET | Freq: Every day | ORAL | Status: DC
  Administered 2013-04-07 – 2013-04-10 (×4): 25 mg via ORAL
  Filled 2013-04-07 (×4): qty 1

## 2013-04-07 MED ORDER — SODIUM CHLORIDE 0.9 % IV SOLN
INTRAVENOUS | Status: DC
  Administered 2013-04-07 – 2013-04-09 (×4): via INTRAVENOUS

## 2013-04-07 NOTE — Progress Notes (Signed)
Contacted MD on call to notify him of head CT results. Also informed him that the patient's heart rate and blood pressure has been low as well. Received order to hold tonight's lopressor. Will continue to monitor closely.

## 2013-04-08 LAB — BASIC METABOLIC PANEL
BUN: 37 mg/dL — ABNORMAL HIGH (ref 6–23)
CO2: 24 mEq/L (ref 19–32)
Chloride: 102 mEq/L (ref 96–112)
Creatinine, Ser: 3.22 mg/dL — ABNORMAL HIGH (ref 0.50–1.10)
Glucose, Bld: 94 mg/dL (ref 70–99)
Sodium: 138 mEq/L (ref 135–145)

## 2013-04-08 MED ORDER — DILTIAZEM HCL ER COATED BEADS 120 MG PO CP24
120.0000 mg | ORAL_CAPSULE | Freq: Every day | ORAL | Status: DC
  Administered 2013-04-09 – 2013-04-11 (×3): 120 mg via ORAL
  Filled 2013-04-08 (×3): qty 1

## 2013-04-08 MED ORDER — MUPIROCIN 2 % EX OINT
1.0000 "application " | TOPICAL_OINTMENT | Freq: Two times a day (BID) | CUTANEOUS | Status: DC
  Administered 2013-04-08 – 2013-04-11 (×8): 1 via NASAL
  Filled 2013-04-08: qty 22

## 2013-04-08 MED ORDER — ENSURE COMPLETE PO LIQD
237.0000 mL | Freq: Three times a day (TID) | ORAL | Status: DC
  Administered 2013-04-08 – 2013-04-11 (×7): 237 mL via ORAL

## 2013-04-08 MED ORDER — CHLORHEXIDINE GLUCONATE CLOTH 2 % EX PADS
6.0000 | MEDICATED_PAD | Freq: Every day | CUTANEOUS | Status: DC
  Administered 2013-04-08 – 2013-04-11 (×2): 6 via TOPICAL

## 2013-04-08 NOTE — Care Management Note (Unsigned)
    Page 1 of 1   04/08/2013     12:29:07 PM   CARE MANAGEMENT NOTE 04/08/2013  Patient:  MARGEART, ALLENDER   Account Number:  [id number]  Date Initiated:  04/08/2013  Documentation initiated by:  Rosemary Holms  Subjective/Objective Assessment:   Pt admitted from home where she fell and fx her wrist. Multiple medical issues being address.     Action/Plan:   PT to evaluate. Possible SNF candidate   Anticipated DC Date:  04/10/2013   Anticipated DC Plan:  SKILLED NURSING FACILITY  In-house referral  Clinical Social Worker      DC Planning Services  CM consult      Choice offered to / List presented to:             Status of service:  In process, will continue to follow Medicare Important Message given?   (If response is "NO", the following Medicare IM given date fields will be blank) Date Medicare IM given:   Date Additional Medicare IM given:    Discharge Disposition:    Per UR Regulation:    If discussed at Long Length of Stay Meetings, dates discussed:    Comments:  04/08/13 Rosemary Holms RN BSN CM

## 2013-04-08 NOTE — Clinical Social Work Psychosocial (Signed)
Clinical Social Work Department BRIEF PSYCHOSOCIAL ASSESSMENT 04/08/2013  Patient:  Sheri Matthews, Sheri Matthews     Account Number:  [id number]     Admit date:  04/07/2013  Clinical Social Worker:  Nancie Neas  Date/Time:  04/08/2013 04:05 PM  Referred by:  Physician  Date Referred:  04/08/2013 Referred for  SNF Placement   Other Referral:   Interview type:  Patient Other interview type:   and daughters    PSYCHOSOCIAL DATA Living Status:  ALONE Admitted from facility:   Level of care:   Primary support name:  Glenda/Donna Primary support relationship to patient:  CHILD, ADULT Degree of support available:   supportive    CURRENT CONCERNS Current Concerns  Post-Acute Placement   Other Concerns:    SOCIAL WORK ASSESSMENT / PLAN CSW met with pt and pt's daughters at bedside. Pt alert and oriented and reports she lives alone. Pt's daughters live nearby and are able to check on pt often. She was at home yesterday and fell per her daughter's report. Pt has fractured wrist. She states that at baseline, pt is fairly independent, but has had several falls. Pt uses a cane for ambulating, but also has a rolling walker. PT evaluated pt today and recommendation is for SNF. CSW discussed placement process with pt and family. SNF list provided. Pt has been to [facility] in the past. Requesting either [facility] or [facility] at d/c.   Assessment/plan status:  Psychosocial Support/Ongoing Assessment of Needs Other assessment/ plan:   Information/referral to community resources:   SNF list  Advance directives    PATIENT'S/FAMILY'S RESPONSE TO PLAN OF CARE: Pt and family agreeable to [facility] at d/c with plans to transfer to ALF after rehab. CSW will initiate bed search and follow up with bed offers when available.       Derenda Fennel, [place] [phone]

## 2013-04-08 NOTE — H&P (Signed)
NAMEJACQUES, WILLINGHAM                ACCOUNT NO.:  [id number]  MEDICAL RECORD NO.:  [id number]  LOCATION:  A326                          FACILITY:  [facility]  PHYSICIAN:  Landri Dorsainvil L. Juanetta Gosling, M.D.DATE OF BIRTH:  [birth year]  DATE OF ADMISSION:  04/07/2013 DATE OF DISCHARGE:  [facility]                             HISTORY & PHYSICAL   REASON FOR ADMISSION:  Failure to thrive, multiple falls.  HISTORY:  Ms. Mcarthy is an 79 year old who had been in her usual state of poor health at home when she developed increasing problems with falling, not eating, not feeling well and was brought to my office. When she was seen in my office, she had bruising on her left wrist and some deformity of her left wrist and had a little bit more asymmetry of her face.  It was felt that she needed to be admitted to see what is going on.  PAST MEDICAL HISTORY:  Positive for hypertension, arthritis, shortness of breath, previous stroke, GERD, cardiac arrhythmias, pneumonia, depression and anemia.  PAST SURGICAL HISTORY:  Positive for back surgery and for ovarian surgery.  She also has atrial fibrillation which I did not put down. She has mild dementia.  SOCIAL HISTORY:  She does not smoke.  She does not use any alcohol or illicit drugs.  She has no known drug allergies.  MEDICATIONS:  Elavil 25 mg at bedtime, BuSpar 5 mg twice a day, diltiazem 240 mg daily, Aricept 5 mg daily, Norco 5/325 every 4 times a day as needed, metoprolol 25 mg b.i.d., omeprazole 20 mg daily, simvastatin 20 mg daily, and she has been on Coumadin.  REVIEW OF SYSTEMS:  Except as mentioned is negative.  PHYSICAL EXAMINATION:  VITAL SIGNS: Her blood pressure is 86/41, pulse 57, respirations 18, O2 sats 95%.  Temperature is 97.4. HEENT:  Pupils are reactive.  Nose and throat are clear.  She does have some facial asymmetry. NECK:  Supple without masses.  No bruits are heard. CHEST:  Clear. HEART:  Regular without gallop. ABDOMEN:  Soft.  No masses  are felt. EXTREMITIES:  No edema. NEURO:  Central nervous system examination shows that she does have some changes of dementia, but otherwise grossly intact.  LABORATORY WORK:  White count is 10,300, hemoglobin is 13, platelets 349, BUN is 33 and creatinine 3.33, so I think she is dehydrated and other lab work, CT of the brain, chest x-ray etc are pending.  ASSESSMENT AND PLAN:  My assessment then is she has failure to thrive, she has multiple falls, she has atrial fibrillation and she is on chronic Coumadin therapy and I do not think that is going to be safe for her now.  Plans for her to have CT laboratory work, IV fluids, and chest x-ray.     Nikitas Davtyan L. Juanetta Gosling, M.D.     ELH/MEDQ  D:  04/07/2013  T:  04/08/2013  Job:  [id number]

## 2013-04-08 NOTE — Progress Notes (Signed)
INITIAL NUTRITION ASSESSMENT   INTERVENTION: Ensure Complete po TD, with meals each supplement provides 350 kcal and 13 grams of protein. (prefers chocolate)  Staff to obtain current wt in order to assess for significant changes  RD will follow for nutrition care  NUTRITION DIAGNOSIS: Inadequate oral intake related to poor appetite as evidenced by diet hx and FTT  Goal: Pt to meet >/= 90% of their estimated nutrition needs   Monitor:  Po intake, labs and wt trends  Reason for Assessment: Malnutrition Screen Score = 3  79 y.o. female   ASSESSMENT: Pt up in chair and daughter is present. Her home diet is regular and she is able to feed herself. Her left arm has cast due to left distal radius fx. She reports very good intake of Ensure daily and says she eats only when she's hungry. Nursing staff are obtaining a current wt for evaluation. Will continue to follow for results and evaluate. She is at risk for malnutrition given her FTT, decreased po intake and advancing age.  Patient Active Problem List   Diagnosis Date Noted  . Atrial fibrillation with rapid ventricular response 07/05/2012  . Essential hypertension, benign 07/05/2012  . Left-sided chest wall pain 07/05/2012  . ANEMIA 01/19/2010  . PUD 01/19/2010  . DIARRHEA 01/19/2010    Height: Ht Readings from Last 1 Encounters:  07/03/12 5\' 2"  (1.575 m)    Weight: Wt Readings from Last 1 Encounters:  07/06/12 135 lb 12.9 oz (61.6 kg)    Ideal Body Weight: 110# (61.3 kg)  % Ideal Body Weight: 124%  Wt Readings from Last 10 Encounters:  07/06/12 135 lb 12.9 oz (61.6 kg)  05/25/11 124 lb 1.9 oz (56.3 kg)  03/28/11 125 lb 12.8 oz (57.063 kg)  03/16/11 133 lb (60.328 kg)  01/19/10 132 lb (59.875 kg)    Usual Body Weight: 135# (61.3 kg)  % Usual Body Weight: 100%  BMI: 24.5 normal range (based on February wt)  Estimated Nutritional Needs: Kcal: 1610-9604 Protein: 65-75 gr Fluid: 1800 ml daily  Skin:  intact  Diet Order: Cardiac  EDUCATION NEEDS: -Education needs addressed   Intake/Output Summary (Last 24 hours) at 04/08/13 0907 Last data filed at 04/08/13 0130  Gross per 24 hour  Intake      0 ml  Output    250 ml  Net   -250 ml    Last BM: 04/06/13  Labs:   Recent Labs Lab 04/07/13 1841  NA 137  K 3.5  CL 97  CO2 24  BUN 33*  CREATININE 3.33*  CALCIUM 9.5  GLUCOSE 101*    CBG (last 3)  No results found for this basename: GLUCAP,  in the last 72 hours  Scheduled Meds: . amitriptyline  25 mg Oral QHS  . atorvastatin  10 mg Oral QHS  . busPIRone  5 mg Oral BID  . Chlorhexidine Gluconate Cloth  6 each Topical Q0600  . diltiazem  240 mg Oral Daily  . donepezil  5 mg Oral QHS  . influenza vac split quadrivalent PF  0.5 mL Intramuscular Tomorrow-1000  . metoprolol  25 mg Oral BID  . mupirocin ointment  1 application Nasal BID  . pantoprazole  40 mg Oral Daily  . sodium chloride  3 mL Intravenous Q12H    Continuous Infusions: . sodium chloride 75 mL/hr at 04/08/13 0827    Past Medical History  Diagnosis Date  . Hypertension   . Arthritis   . Shortness of breath   .  Stroke   . GERD (gastroesophageal reflux disease)   . Dysrhythmia   . Pneumonia   . Depression   . Anemia     Past Surgical History  Procedure Laterality Date  . Back surgery    . Ovaries  removed    Royann Shivers MS,RD,CSG,LDN Office: #[phone] Pager: [phone]

## 2013-04-08 NOTE — Progress Notes (Signed)
Utilization Review Complete  

## 2013-04-08 NOTE — Consult Note (Signed)
Reason for Consult:Fracture of the left distal radius Referring Physician: Erryn Dickison is an 79 y.o. female.  HPI: She fell at her home late last night and hurt her left wrist.  She has no other injuries.  She has history recently of unsteadiness and partial falls.  Physical therapy is present now and will begin use of walker with platform.  She has pain in the left wrist with decreased motion.  X-rays show impacted distal radius fracture.  Past Medical History  Diagnosis Date  . Hypertension   . Arthritis   . Shortness of breath   . Stroke   . GERD (gastroesophageal reflux disease)   . Dysrhythmia   . Pneumonia   . Depression   . Anemia     Past Surgical History  Procedure Laterality Date  . Back surgery    . Ovaries  removed    History reviewed. No pertinent family history.  Social History:  reports that she has never smoked. She has never used smokeless tobacco. She reports that she does not drink alcohol or use illicit drugs.  Allergies: No Known Allergies  Medications: I have reviewed the patient's current medications.  Results for orders placed during the hospital encounter of 04/07/13 (from the past 48 hour(s))  MRSA PCR SCREENING     Status: Abnormal   Collection Time    04/07/13  6:29 PM      Result Value Range   MRSA by PCR POSITIVE (*) NEGATIVE   Comment:            The GeneXpert MRSA Assay (FDA     approved for NASAL specimens     only), is one component of a     comprehensive MRSA colonization     surveillance program. It is not     intended to diagnose MRSA     infection nor to guide or     monitor treatment for     MRSA infections.     RESULT CALLED TO, READ BACK BY AND VERIFIED WITH:     ROGERS,S ON 04/07/13 AT 2315 BY LOY,C  COMPREHENSIVE METABOLIC PANEL     Status: Abnormal   Collection Time    04/07/13  6:41 PM      Result Value Range   Sodium 137  135 - 145 mEq/L   Potassium 3.5  3.5 - 5.1 mEq/L   Chloride 97  96 - 112 mEq/L   CO2 24  19 - 32 mEq/L   Glucose, Bld 101 (*) 70 - 99 mg/dL   BUN 33 (*) 6 - 23 mg/dL   Creatinine, Ser 1.61 (*) 0.50 - 1.10 mg/dL   Calcium 9.5  8.4 - 09.6 mg/dL   Total Protein 7.1  6.0 - 8.3 g/dL   Albumin 3.5  3.5 - 5.2 g/dL   AST 16  0 - 37 U/L   ALT 10  0 - 35 U/L   Alkaline Phosphatase 89  39 - 117 U/L   Total Bilirubin 0.2 (*) 0.3 - 1.2 mg/dL   GFR calc non Af Amer 11 (*) >90 mL/min   GFR calc Af Amer 13 (*) >90 mL/min   Comment: (NOTE)     The eGFR has been calculated using the CKD EPI equation.     This calculation has not been validated in all clinical situations.     eGFR's persistently <90 mL/min signify possible Chronic Kidney     Disease.  PROTIME-INR     Status:  Abnormal   Collection Time    04/07/13  6:41 PM      Result Value Range   Prothrombin Time 25.8 (*) 11.6 - 15.2 seconds   INR 2.45 (*) 0.00 - 1.49  CBC WITH DIFFERENTIAL     Status: None   Collection Time    04/07/13  6:41 PM      Result Value Range   WBC 10.3  4.0 - 10.5 K/uL   RBC 4.29  3.87 - 5.11 MIL/uL   Hemoglobin 13.0  12.0 - 15.0 g/dL   HCT 54.0  98.1 - 19.1 %   MCV 93.5  78.0 - 100.0 fL   MCH 30.3  26.0 - 34.0 pg   MCHC 32.4  30.0 - 36.0 g/dL   RDW 47.8  29.5 - 62.1 %   Platelets 349  150 - 400 K/uL   Neutrophils Relative % 71  43 - 77 %   Neutro Abs 7.3  1.7 - 7.7 K/uL   Lymphocytes Relative 18  12 - 46 %   Lymphs Abs 1.8  0.7 - 4.0 K/uL   Monocytes Relative 8  3 - 12 %   Monocytes Absolute 0.8  0.1 - 1.0 K/uL   Eosinophils Relative 3  0 - 5 %   Eosinophils Absolute 0.3  0.0 - 0.7 K/uL   Basophils Relative 0  0 - 1 %   Basophils Absolute 0.0  0.0 - 0.1 K/uL  BASIC METABOLIC PANEL     Status: Abnormal   Collection Time    04/08/13  9:45 AM      Result Value Range   Sodium 138  135 - 145 mEq/L   Potassium 3.8  3.5 - 5.1 mEq/L   Chloride 102  96 - 112 mEq/L   CO2 24  19 - 32 mEq/L   Glucose, Bld 94  70 - 99 mg/dL   BUN 37 (*) 6 - 23 mg/dL   Creatinine, Ser 3.08 (*) 0.50 - 1.10  mg/dL   Calcium 8.7  8.4 - 65.7 mg/dL   GFR calc non Af Amer 12 (*) >90 mL/min   GFR calc Af Amer 14 (*) >90 mL/min   Comment: (NOTE)     The eGFR has been calculated using the CKD EPI equation.     This calculation has not been validated in all clinical situations.     eGFR's persistently <90 mL/min signify possible Chronic Kidney     Disease.    X-ray Chest Pa And Lateral   04/07/2013   CLINICAL DATA:  Syncope.  Hypertension.  Failure to thrive.  EXAM: CHEST  2 VIEW  COMPARISON:  None.  FINDINGS: Mild cardiomegaly is stable. Both lungs are clear. No evidence of pleural effusion. No mass or lymphadenopathy identified. An old mild lower thoracic vertebral body compression fracture again demonstrated.  IMPRESSION: Stable mild cardiomegaly.  No active lung disease.   Electronically Signed   By: Myles Rosenthal M.D.   On: 04/07/2013 21:53   Dg Wrist Complete Left  04/07/2013   CLINICAL DATA:  Recent traumatic injury with pain  EXAM: LEFT WRIST - COMPLETE 3+ VIEW  COMPARISON:  None.  FINDINGS: There is a impaction fracture of the left radius distally with extension in the radial carpal articulation. Degenerative changes of the 1st [facility] joint are noted. Changes of prior ulnar styloid fracture are seen.  IMPRESSION: Comminuted distal radial fracture with extension into the radial carpal articulation.   Electronically Signed   By: Loraine Leriche  Lukens M.D.   On: 04/07/2013 21:11   Ct Head Wo Contrast  04/07/2013   CLINICAL DATA:  79 year old female with multiple falls. Initial encounter.  EXAM: CT HEAD WITHOUT CONTRAST  TECHNIQUE: Contiguous axial images were obtained from the base of the skull through the vertex without intravenous contrast.  COMPARISON:  03/28/2011 and earlier.  FINDINGS: No scalp hematoma identified. Stable postoperative appearance of the orbits. Visualized paranasal sinuses and mastoids are clear.  No calvarium fracture identified.  Calcified atherosclerosis at the skull base. Stable cerebral  volume.  Stable ventricle size and configuration. There is a small volume of teardrop shaped intraventricular hemorrhage on the right within the lateral ventricle and probably adherent to the septum callosum (series 4, image 17). Estimated hemorrhage volume less than 1 mL. No layering intraventricular hemorrhage. No other extra-axial hemorrhage identified.  Stable basilar cisterns. Scattered bilateral white matter hypodensity is stable. No evidence of cortically based acute infarction identified. No suspicious intracranial vascular hyperdensity. There is a small chronic left posterior convexity calcified extra-axial mass which is stable measuring up to 11 mm diameter. No evidence of underlying cerebral edema. No other intracranial mass lesion.  IMPRESSION: 1. Trace intraventricular hemorrhage, favor posttraumatic in this setting. Query underlying anticoagulation in this patient. 2. No other acute injury identified and otherwise stable brain since 2012, with chronic nonspecific white matter disease and a small left posterior convexity meningioma. Critical Value/emergent results were called by telephone at the time of interpretation on 04/07/2013 at 8:34 PM to RN Albin Fischer, who verbally acknowledged these results.   Electronically Signed   By: Augusto Gamble M.D.   On: 04/07/2013 20:37    Review of Systems  Musculoskeletal: Positive for falls (Pain of the left wrist with swelling and decreased motion.).   Blood pressure 128/76, pulse 88, temperature 98.6 F (37 C), temperature source Oral, resp. rate 18, SpO2 96.00%. Physical Exam  Constitutional: She is oriented to person, place, and time. She appears well-developed and well-nourished.  HENT:  Head: Normocephalic and atraumatic.  Eyes: Conjunctivae are normal. Pupils are equal, round, and reactive to light.  Neck: Normal range of motion. Neck supple.  Cardiovascular: Intact distal pulses.   Respiratory: Effort normal.  GI: Soft.  Musculoskeletal:  She exhibits tenderness (Pain, ecchymosis of the left wrist with decreased motion.  NV is intact.  Grip decreased secondary to pain.).       Left wrist: She exhibits decreased range of motion, tenderness, bony tenderness and swelling.       Arms: Neurological: She is alert and oriented to person, place, and time. She has normal reflexes.  Skin: Skin is warm and dry.  Psychiatric: She has a normal mood and affect. Her behavior is normal. Judgment and thought content normal.    Assessment/Plan: Fracture of the left distal radius, impacted.  I have applied a sugar tong splint.  I will see her in my office next week.  Call for appointment.  She will need splint or cast for four to six weeks.  I will get x-rays in the office.  Thank you.  Shloima Clinch 04/08/2013, 10:21 AM

## 2013-04-08 NOTE — Clinical Social Work Placement (Signed)
Clinical Social Work Department CLINICAL SOCIAL WORK PLACEMENT NOTE 04/08/2013  Patient:  Sheri Matthews, Sheri Matthews  Account Number:  [id number] Admit date:  04/07/2013  Clinical Social Worker:  Derenda Fennel, LCSW  Date/time:  04/08/2013 04:00 PM  Clinical Social Work is seeking post-discharge placement for this patient at the following level of care:   SKILLED NURSING   (*CSW will update this form in Epic as items are completed)   04/08/2013  Patient/family provided with [facility] Department of Clinical Social Work's list of facilities offering this level of care within the geographic area requested by the patient (or if unable, by the patient's family).  04/08/2013  Patient/family informed of their freedom to choose among providers that offer the needed level of care, that participate in Medicare, Medicaid or managed care program needed by the patient, have an available bed and are willing to accept the patient.  04/08/2013  Patient/family informed of [facility]' ownership interest in [facility], as well as of the fact that they are under no obligation to receive care at this facility.  PASARR submitted to EDS on  PASARR number received from EDS on   FL2 transmitted to all facilities in geographic area requested by pt/family on  04/08/2013 FL2 transmitted to all facilities within larger geographic area on   Patient informed that his/her managed care company has contracts with or will negotiate with  certain facilities, including the following:     Patient/family informed of bed offers received:   Patient chooses bed at  Physician recommends and patient chooses bed at    Patient to be transferred to  on   Patient to be transferred to facility by   The following physician request were entered in Epic:   Additional Comments: Pt has existing pasarr number.  Derenda Fennel, [place] [phone]

## 2013-04-08 NOTE — Clinical Documentation Improvement (Signed)
THIS DOCUMENT IS NOT A PERMANENT PART OF THE MEDICAL RECORD  Please update your documentation with the medical record to reflect your response to this query. If you need help knowing how to do this please call [phone].  04/08/13  Dear Dr.Tequisha Maahs  A review of the patient medical record has revealed the following indicators.  Based on your clinical judgment, please clarify renal status and document in a progress note and/or discharge summary the clinical condition associated with the following supporting information:  Admitted with Failure to Thrive and Fractured Left distal radius Risk Factors History of hypertension  Advanced age: 79 Not eating  Labs: BUN/CR/GFR 07/04/12 = 23/1.29/36 04/07/13 = 33/3.33/11 04/08/13 = 37/3.22/12  Treatments Monitor BMPs Daily weights Monitor I&O    Possible Clinical Conditions?  Acute Renal Failure Acute Kidney Injury Acute on Chronic Renal Failure Chronic Renal Failure Other Condition_____________ Cannot Clinically Determine      You may use possible, probable, or suspect with inpatient documentation. possible, probable, suspected diagnoses MUST be documented at the time of discharge  Reviewed: additional documentation in the medical record  Thank Angelene Giovanni  Clinical Documentation Specialist: [phone] Health Information Management [place]

## 2013-04-08 NOTE — Evaluation (Signed)
Physical Therapy Evaluation Patient Details Name: Sheri Matthews MRN: [id number] DOB: Feb 01, 1924 Today's Date: 04/08/2013 Time: 1352-1440 PT Time Calculation (min): 48 min  PT Assessment / Plan / Recommendation History of Present Illness  Pt lives alone with family nearby.  She reports numerous falls at home the most recent of which was yesterday where she fractured her left wrist.  Pt states that she frequently is unable to eat and has been diagnosed with failure to thrive..  Clinical Impression   Pt was seen for evaluation.  She was seen initially in the AM and she was found to have generalized weakness along with a newly fractured left wrist.  Before we could progress to gait, Dr. Hilda Lias arrived to place a splint on her wrist.  PT was stopped and resumed in the afternoon.  Her wrist pain was significantly relieved by the stabilization of the splint.  Pt was able to transfer OOB and ambulate with a left platform walker 10'x2.  Her major problem is that of suspected orthostatic hypotension especially in standing.  I attempted to document this but was unable to get a BP reading.  Her dizziness begins after she has been upright for 1-2 minutes.  This is going to continue to cause mobility problems unless resolved.  She is not anxious about wearing support stockings.  She would be appropriate for SNF due to generalized weakness/deconditioning, but if she and family decide to go to ACLF she will need HHPT.    PT Assessment  Patient needs continued PT services    Follow Up Recommendations  SNF (if ACLF is chosen, will need HHPT)    Does the patient have the potential to tolerate intense rehabilitation      Barriers to Discharge Decreased caregiver support      Equipment Recommendations  Other (comment) (left platform walker)    Recommendations for Other Services     Frequency Min 3X/week    Precautions / Restrictions Precautions Precautions: Fall Restrictions Weight Bearing  Restrictions: No   Pertinent Vitals/Pain       Mobility  Bed Mobility Bed Mobility: Supine to Sit Supine to Sit: 4: Min assist;HOB flat Transfers Transfers: Sit to Stand;Stand to Sit Sit to Stand: 4: Min assist;From bed;From toilet;With upper extremity assist Stand to Sit: 4: Min guard;With upper extremity assist;To toilet;To chair/3-in-1 Ambulation/Gait Ambulation/Gait Assistance: 4: Min guard Ambulation Distance (Feet): 10 Feet (x2) Assistive device: Left platform walker Gait Pattern: Within Functional Limits Gait velocity: WNL General Gait Details: pt becomes very dizzy after being upright for several minutes.Marland KitchenMarland KitchenI attempted to take orthostatic BPs, but was unable to get a reading. Stairs: No Wheelchair Mobility Wheelchair Mobility: No    Exercises     PT Diagnosis: Difficulty walking;Generalized weakness;Acute pain  PT Problem List: Decreased strength;Decreased activity tolerance;Decreased mobility;Decreased knowledge of use of DME;Pain;Cardiopulmonary status limiting activity PT Treatment Interventions: Gait training;DME instruction;Functional mobility training;Therapeutic exercise;Patient/family education     PT Goals(Current goals can be found in the care plan section) Acute Rehab PT Goals Patient Stated Goal: none stated PT Goal Formulation: With patient/family Time For Goal Achievement: 05/22/13 Potential to Achieve Goals: Good  Visit Information  Last PT Received On: 04/08/13 History of Present Illness: Pt lives alone with family nearby.  She reports numerous falls at home the most recent of which was yesterday where she fractured her left wrist.  Pt states that she frequently is unable to eat and has been diagnosed with failure to thrive..       Prior  Functioning  Home Living Family/patient expects to be discharged to:: Skilled nursing facility Living Arrangements: Alone Additional Comments: family lives nearby Prior Function Level of Independence:  Independent with assistive device(s) Comments: usually uses a quad cane for gait Communication Communication: No difficulties    Cognition  Cognition Arousal/Alertness: Awake/alert Behavior During Therapy: WFL for tasks assessed/performed Overall Cognitive Status: Within Functional Limits for tasks assessed    Extremity/Trunk Assessment Upper Extremity Assessment Upper Extremity Assessment: LUE deficits/detail LUE Deficits / Details: left wrist is in a splint Lower Extremity Assessment Lower Extremity Assessment: Generalized weakness   Balance Balance Balance Assessed: No  End of Session PT - End of Session Equipment Utilized During Treatment: Gait belt Activity Tolerance: Patient tolerated treatment well Patient left: in chair;with call bell/phone within reach;with family/visitor present Nurse Communication: Mobility status  GP     Konrad Penta 04/08/2013, 4:46 PM

## 2013-04-09 ENCOUNTER — Inpatient Hospital Stay (HOSPITAL_COMMUNITY): Payer: Medicare Other

## 2013-04-09 LAB — BASIC METABOLIC PANEL
BUN: 35 mg/dL — ABNORMAL HIGH (ref 6–23)
CO2: 24 mEq/L (ref 19–32)
Chloride: 107 mEq/L (ref 96–112)
Creatinine, Ser: 2.08 mg/dL — ABNORMAL HIGH (ref 0.50–1.10)
GFR calc Af Amer: 24 mL/min — ABNORMAL LOW (ref >90)
Glucose, Bld: 92 mg/dL (ref 70–99)
Potassium: 4.1 mEq/L (ref 3.5–5.1)
Sodium: 143 mEq/L (ref 135–145)

## 2013-04-09 MED ORDER — LORAZEPAM 2 MG/ML IJ SOLN
0.5000 mg | INTRAMUSCULAR | Status: DC | PRN
  Administered 2013-04-09: 0.5 mg via INTRAVENOUS
  Filled 2013-04-09: qty 1

## 2013-04-09 MED ORDER — LORAZEPAM 2 MG/ML IJ SOLN
1.0000 mg | INTRAMUSCULAR | Status: DC | PRN
  Administered 2013-04-09 – 2013-04-10 (×2): 1 mg via INTRAVENOUS
  Filled 2013-04-09 (×2): qty 1

## 2013-04-09 NOTE — Progress Notes (Signed)
Patient's daughters decided to leave because they felt they were "making mama worse" by being here.  Sat with patient to provide reassurance.  After patient had rested quietly with decreased stimulation, patient fell asleep and was resting well.  Will continue to check on patient.  Schonewitz, Candelaria Stagers 04/09/2013

## 2013-04-09 NOTE — Progress Notes (Signed)
PT Cancellation Note  Patient Details Name: Sheri Matthews MRN: [id number] DOB: November 16, 1923   Cancelled Treatment:    Reason Eval/Treat Not Completed: Other (comment) Pt  Is very confused and unable to follow directions.  She is not appropriate for PT this afternoon. Myrlene Broker L 04/09/2013, 3:15 PM

## 2013-04-09 NOTE — Progress Notes (Signed)
Found patient sitting in bed with left arm splint off.  Patient handed me her splint when I walked into room.  Asked patient what happened to splint and she stated that she "took it off because she was tired of wearing it."  Primary MD notified and ordered Ativan 0.5mg  IV q4prn, med given as ordered.  Ortho MD notified and ordered to reapply splint and xray left wrist to see if fracture was displaced.  Orders received and carried out.  Schonewitz, Candelaria Stagers 04/09/2013

## 2013-04-09 NOTE — Clinical Social Work Note (Signed)
CSW presented bed offers and pt's daughters choose [facility] SNF. Facility notified. CSW to continue to follow.   Derenda Fennel, [place] [phone]

## 2013-04-09 NOTE — Progress Notes (Addendum)
Patient awake again and very confused and agitated again.  MD increased Ativan and med given per MD order.  Will sit with patient and provide reassurance until patient calms down.  Schonewitz, Candelaria Stagers 04/09/2013

## 2013-04-09 NOTE — Progress Notes (Signed)
Patient awake, confused, agitated, irritated with staff at this time.  MD paged twice with no return call.  Will continue to monitor patient and attempt to contact MD. Arloa Koh 04/09/2013

## 2013-04-09 NOTE — Progress Notes (Signed)
Patient resting well at this time.  Schonewitz, Candelaria Stagers 04/09/2013

## 2013-04-09 NOTE — Progress Notes (Signed)
Patient's Daughters arrived and attempted to provide reassurance to patient.  Patient still very confused and agitated, trying to get out of bed and cursing at staff.  Schonewitz, Candelaria Stagers 04/09/2013

## 2013-04-09 NOTE — Progress Notes (Signed)
MD on call reached, increased Ativan.  Orders received and carried out.

## 2013-04-09 NOTE — Clinical Social Work Placement (Signed)
Clinical Social Work Department CLINICAL SOCIAL WORK PLACEMENT NOTE 04/09/2013  Patient:  Sheri Matthews, Sheri Matthews  Account Number:  [id number] Admit date:  04/07/2013  Clinical Social Worker:  Derenda Fennel, LCSW  Date/time:  04/08/2013 04:00 PM  Clinical Social Work is seeking post-discharge placement for this patient at the following level of care:   SKILLED NURSING   (*CSW will update this form in Epic as items are completed)   04/08/2013  Patient/family provided with [facility] Department of Clinical Social Work's list of facilities offering this level of care within the geographic area requested by the patient (or if unable, by the patient's family).  04/08/2013  Patient/family informed of their freedom to choose among providers that offer the needed level of care, that participate in Medicare, Medicaid or managed care program needed by the patient, have an available bed and are willing to accept the patient.  04/08/2013  Patient/family informed of [facility]' ownership interest in [facility], as well as of the fact that they are under no obligation to receive care at this facility.  PASARR submitted to EDS on  PASARR number received from EDS on   FL2 transmitted to all facilities in geographic area requested by pt/family on  04/08/2013 FL2 transmitted to all facilities within larger geographic area on   Patient informed that his/her managed care company has contracts with or will negotiate with  certain facilities, including the following:     Patient/family informed of bed offers received:  04/09/2013 Patient chooses bed at [facility] Physician recommends and patient chooses bed at  [facility]  Patient to be transferred to  on   Patient to be transferred to facility by   The following physician request were entered in Epic:   Additional Comments: Pt has existing pasarr number.  Derenda Fennel, [place] [phone]

## 2013-04-10 ENCOUNTER — Encounter (HOSPITAL_COMMUNITY): Payer: Self-pay | Admitting: Pulmonary Disease

## 2013-04-10 DIAGNOSIS — N179 Acute kidney failure, unspecified: Secondary | ICD-10-CM

## 2013-04-10 DIAGNOSIS — I4891 Unspecified atrial fibrillation: Secondary | ICD-10-CM | POA: Diagnosis present

## 2013-04-10 DIAGNOSIS — R627 Adult failure to thrive: Secondary | ICD-10-CM | POA: Diagnosis present

## 2013-04-10 DIAGNOSIS — S52509A Unspecified fracture of the lower end of unspecified radius, initial encounter for closed fracture: Secondary | ICD-10-CM

## 2013-04-10 DIAGNOSIS — I951 Orthostatic hypotension: Secondary | ICD-10-CM | POA: Diagnosis present

## 2013-04-10 DIAGNOSIS — E860 Dehydration: Secondary | ICD-10-CM | POA: Diagnosis present

## 2013-04-10 DIAGNOSIS — F03918 Unspecified dementia, unspecified severity, with other behavioral disturbance: Secondary | ICD-10-CM | POA: Diagnosis present

## 2013-04-10 DIAGNOSIS — F0391 Unspecified dementia with behavioral disturbance: Secondary | ICD-10-CM

## 2013-04-10 DIAGNOSIS — R296 Repeated falls: Secondary | ICD-10-CM

## 2013-04-10 HISTORY — DX: Unspecified dementia, unspecified severity, with other behavioral disturbance: F03.918

## 2013-04-10 HISTORY — DX: Acute kidney failure, unspecified: N17.9

## 2013-04-10 HISTORY — DX: Unspecified dementia with behavioral disturbance: F03.91

## 2013-04-10 LAB — BASIC METABOLIC PANEL
BUN: 26 mg/dL — ABNORMAL HIGH (ref 6–23)
CO2: 26 mEq/L (ref 19–32)
Chloride: 108 mEq/L (ref 96–112)
Creatinine, Ser: 1.14 mg/dL — ABNORMAL HIGH (ref 0.50–1.10)
GFR calc Af Amer: 49 mL/min — ABNORMAL LOW (ref >90)
Glucose, Bld: 87 mg/dL (ref 70–99)
Potassium: 4.8 mEq/L (ref 3.5–5.1)

## 2013-04-10 MED ORDER — DILTIAZEM HCL ER COATED BEADS 120 MG PO CP24: 120.0000 mg | ORAL_CAPSULE | Freq: Every day | ORAL | Status: AC

## 2013-04-10 MED ORDER — PRAVASTATIN SODIUM 40 MG PO TABS: 40.0000 mg | ORAL_TABLET | Freq: Every day | ORAL | Status: DC

## 2013-04-10 MED ORDER — ALPRAZOLAM 0.25 MG PO TABS: 0.2500 mg | ORAL_TABLET | Freq: Three times a day (TID) | ORAL | Status: DC | PRN

## 2013-04-10 MED ORDER — METOPROLOL TARTRATE 12.5 MG HALF TABLET: 50.0000 mg | ORAL_TABLET | Freq: Two times a day (BID) | ORAL | Status: DC

## 2013-04-10 NOTE — Progress Notes (Signed)
Late entry:  Patient takes left wrist splint off of arm and refuses to wear it.  Dr. Romeo Apple notified by RN.  No new orders at this time.  Stated to try and place splint back on left wrist.

## 2013-04-10 NOTE — Discharge Summary (Signed)
Physician Discharge Summary  Patient ID: Sheri Matthews MRN: [id number] DOB/AGE: 1923-10-23 79 y.o. Primary Care Physician:Donabelle Molden L, MD Admit date: 04/07/2013 Discharge date: 04/10/2013    Discharge Diagnoses:   Principal Problem:   Failure to thrive in adult Active Problems:   Essential hypertension, benign   Dementia with behavioral disturbance   Distal radial fracture   Atrial fibrillation   Dehydration   Acute renal failure   Multiple falls   Orthostatic hypotension     Medication List    STOP taking these medications       cetirizine 10 MG tablet  Commonly known as:  ZYRTEC     simvastatin 20 MG tablet  Commonly known as:  ZOCOR     warfarin 2 MG tablet  Commonly known as:  COUMADIN      TAKE these medications       ALPRAZolam 0.25 MG tablet  Commonly known as:  XANAX  Take 1 tablet (0.25 mg total) by mouth 3 (three) times daily as needed for anxiety (agitation).     amitriptyline 25 MG tablet  Commonly known as:  ELAVIL  Take 25 mg by mouth at bedtime.     busPIRone 5 MG tablet  Commonly known as:  BUSPAR  Take 5 mg by mouth 2 (two) times daily.     citalopram 20 MG tablet  Commonly known as:  CELEXA  Take 20 mg by mouth daily.     diltiazem 120 MG 24 hr capsule  Commonly known as:  CARDIZEM CD  Take 1 capsule (120 mg total) by mouth daily.     donepezil 5 MG tablet  Commonly known as:  ARICEPT  Take 5 mg by mouth at bedtime.     HYDROcodone-acetaminophen 5-325 MG per tablet  Commonly known as:  NORCO/VICODIN  Take 1 tablet by mouth every 6 (six) hours as needed for pain.     metoprolol tartrate 12.5 mg Tabs tablet  Commonly known as:  LOPRESSOR  Take 2 tablets (50 mg total) by mouth 2 (two) times daily.     omeprazole 20 MG capsule  Commonly known as:  PRILOSEC  Take 20 mg by mouth daily.     pravastatin 40 MG tablet  Commonly known as:  PRAVACHOL  Take 1 tablet (40 mg total) by mouth daily.        Discharged  Condition: Improved    Consults: Orthopedics, Dr. Hilda Lias  Significant Diagnostic Studies: X-ray Chest Pa And Lateral   04/07/2013   CLINICAL DATA:  Syncope.  Hypertension.  Failure to thrive.  EXAM: CHEST  2 VIEW  COMPARISON:  None.  FINDINGS: Mild cardiomegaly is stable. Both lungs are clear. No evidence of pleural effusion. No mass or lymphadenopathy identified. An old mild lower thoracic vertebral body compression fracture again demonstrated.  IMPRESSION: Stable mild cardiomegaly.  No active lung disease.   Electronically Signed   By: Myles Rosenthal M.D.   On: 04/07/2013 21:53   Dg Wrist Complete Left  04/09/2013   CLINICAL DATA:  Follow up fracture.  Splint removal.  EXAM: LEFT WRIST - COMPLETE 3+ VIEW  COMPARISON:  04/07/2013.  FINDINGS: The impacted fracture of the distal radius demonstrates no significant change. There is disruption of the dorsal cortex on the lateral view and fracture extension into the distal radioulnar joint which is mildly widened. Old ulnar styloid fracture, TFCC chondrocalcinosis and advanced degenerative changes at the 1st metacarpal base are again noted. There is moderate soft tissue swelling within the  distal forearm.  IMPRESSION: Unchanged alignment of impacted and mildly displaced distal radial fracture.   Electronically Signed   By: Roxy Horseman M.D.   On: 04/09/2013 17:36   Dg Wrist Complete Left  04/07/2013   CLINICAL DATA:  Recent traumatic injury with pain  EXAM: LEFT WRIST - COMPLETE 3+ VIEW  COMPARISON:  None.  FINDINGS: There is a impaction fracture of the left radius distally with extension in the radial carpal articulation. Degenerative changes of the 1st [facility] joint are noted. Changes of prior ulnar styloid fracture are seen.  IMPRESSION: Comminuted distal radial fracture with extension into the radial carpal articulation.   Electronically Signed   By: Alcide Clever M.D.   On: 04/07/2013 21:11   Ct Head Wo Contrast  04/07/2013   CLINICAL DATA:   79 year old female with multiple falls. Initial encounter.  EXAM: CT HEAD WITHOUT CONTRAST  TECHNIQUE: Contiguous axial images were obtained from the base of the skull through the vertex without intravenous contrast.  COMPARISON:  03/28/2011 and earlier.  FINDINGS: No scalp hematoma identified. Stable postoperative appearance of the orbits. Visualized paranasal sinuses and mastoids are clear.  No calvarium fracture identified.  Calcified atherosclerosis at the skull base. Stable cerebral volume.  Stable ventricle size and configuration. There is a small volume of teardrop shaped intraventricular hemorrhage on the right within the lateral ventricle and probably adherent to the septum callosum (series 4, image 17). Estimated hemorrhage volume less than 1 mL. No layering intraventricular hemorrhage. No other extra-axial hemorrhage identified.  Stable basilar cisterns. Scattered bilateral white matter hypodensity is stable. No evidence of cortically based acute infarction identified. No suspicious intracranial vascular hyperdensity. There is a small chronic left posterior convexity calcified extra-axial mass which is stable measuring up to 11 mm diameter. No evidence of underlying cerebral edema. No other intracranial mass lesion.  IMPRESSION: 1. Trace intraventricular hemorrhage, favor posttraumatic in this setting. Query underlying anticoagulation in this patient. 2. No other acute injury identified and otherwise stable brain since 2012, with chronic nonspecific white matter disease and a small left posterior convexity meningioma. Critical Value/emergent results were called by telephone at the time of interpretation on 04/07/2013 at 8:34 PM to RN Albin Fischer, who verbally acknowledged these results.   Electronically Signed   By: Augusto Gamble M.D.   On: 04/07/2013 20:37    Lab Results: Basic Metabolic Panel:  Recent Labs  16/10/96 0610 04/10/13 0635  NA 143 141  K 4.1 4.8  CL 107 108  CO2 24 26  GLUCOSE  92 87  BUN 35* 26*  CREATININE 2.08* 1.14*  CALCIUM 8.8 9.0   Liver Function Tests:  Recent Labs  04/07/13 1841  AST 16  ALT 10  ALKPHOS 89  BILITOT 0.2*  PROT 7.1  ALBUMIN 3.5     CBC:  Recent Labs  04/07/13 1841  WBC 10.3  NEUTROABS 7.3  HGB 13.0  HCT 40.1  MCV 93.5  PLT 349    Recent Results (from the past 240 hour(s))  MRSA PCR SCREENING     Status: Abnormal   Collection Time    04/07/13  6:29 PM      Result Value Range Status   MRSA by PCR POSITIVE (*) NEGATIVE Final   Comment:            The GeneXpert MRSA Assay (FDA     approved for NASAL specimens     only), is one component of a     comprehensive MRSA colonization  surveillance program. It is not     intended to diagnose MRSA     infection nor to guide or     monitor treatment for     MRSA infections.     RESULT CALLED TO, READ BACK BY AND VERIFIED WITH:     ROGERS,S ON 04/07/13 AT 2315 BY [facility] Course: This is an 79 year old who has baseline dementia chronic atrial fibrillation on chronic anticoagulation. She has not been eating or drinking well and has had multiple falls. She has obvious deformity of her left wrist when she came to my office. She was admitted to the hospital for failure to thrive. She was clearly no longer a candidate for anti-coagulation this was discontinued. She was in acute renal failure thought to be related to dehydration. She had orthopedic consultation and was put in a sugar tongs splint. She was given IV fluids and her renal failure resolved. She had more problems with disorientation and agitation. It was clear that she was not going to be able to return home so plans were made for her to go to a skilled care facility for rehabilitation. She had orthostatic hypotension thought to be related to her dehydration but her blood pressure medications were reduced while she was in the hospital. At the time of discharge she was awake but somewhat confused her renal  function had improved back to baseline she was off Coumadin.  Discharge Exam: Blood pressure 121/72, pulse 82, temperature 98.1 F (36.7 C), temperature source Oral, resp. rate 18, height 5\' 1"  (1.549 m), weight 56.7 kg (125 lb), SpO2 91.00%. Awake confused in atrial fibrillation  Disposition: To skilled care facility when bed available      Signed: Sonya Gunnoe Elbert Ewings Pager [phone]  04/10/2013, 9:41 AM

## 2013-04-11 NOTE — Progress Notes (Signed)
Patient left in stable condition via EMS. Daughters at bedside to collect personal items. Offered bath to patient before she left, daughters even pleaded with patient. Patient stated "NO! I'll take a wash up before bed tonight. Report called to [place] at [facility].

## 2013-04-11 NOTE — Progress Notes (Signed)
NAMEREHA, MARTINOVICH                ACCOUNT NO.:  [id number]  MEDICAL RECORD NO.:  [id number]  LOCATION:  A326                          FACILITY:  [facility]  PHYSICIAN:  Samauri Kellenberger L. Juanetta Gosling, M.D.DATE OF BIRTH:  03-15-1924  DATE OF PROCEDURE:  04/10/2013 DATE OF DISCHARGE:                                PROGRESS NOTE   Ms. Fernholz is still confused.  She had her splint back on and took it back off.  She is awake and alert, but confused.  Her chest is clear. Her heart is irregular.  Her abdomen is soft.  She has an obvious deformity of her wrist.  She is in atrial fibrillation.  Her creatinine has improved further.  ASSESSMENT:  She is much improved as far as her acute renal failure, dehydration are concerned.  She still has problems with her wrist fracture and we will leave her cast on.  She has more problems with confusion.  She has been approved I think for nursing home placement and she may be able to go as soon as tomorrow.  Her creatinine today is 1.14.  I do not plan to change anything at this time.     Krystle Polcyn L. Juanetta Gosling, M.D.     ELH/MEDQ  D:  04/10/2013  T:  04/10/2013  Job:  [id number]

## 2013-04-11 NOTE — Progress Notes (Signed)
Subjective: Patient is alert and awake but confused and disoriented. Patient is back to her baseline. No nausea or vomiting.  Objective: Vital signs in last 24 hours: Temp:  [98.1 F (36.7 C)-98.2 F (36.8 C)] 98.2 F (36.8 C) (11/28 0018) Pulse Rate:  [86-100] 86 (11/28 0018) BP: (118-149)/(73-90) 118/73 mmHg (11/28 0018) SpO2:  [92 %-94 %] 94 % (11/28 0018) Weight change:  Last BM Date: 04/10/13  Intake/Output from previous day: 11/27 0701 - 11/28 0700 In: 120 [P.O.:120] Out: 701 [Urine:700; Stool:1]  PHYSICAL EXAM General appearance: alert and no distress Resp: clear to auscultation bilaterally Cardio: S1, S2 normal GI: soft, non-tender; bowel sounds normal; no masses,  no organomegaly Extremities: extremities normal, atraumatic, no cyanosis or edema  Lab Results:    @labtest @ [facility] No results found for this basename: PHART, PCO2, PO2ART, TCO2, HCO3,  in the last 72 hours CULTURES Recent Results (from the past 240 hour(s))  MRSA PCR SCREENING     Status: Abnormal   Collection Time    04/07/13  6:29 PM      Result Value Range Status   MRSA by PCR POSITIVE (*) NEGATIVE Final   Comment:            The GeneXpert MRSA Assay (FDA     approved for NASAL specimens     only), is one component of a     comprehensive MRSA colonization     surveillance program. It is not     intended to diagnose MRSA     infection nor to guide or     monitor treatment for     MRSA infections.     RESULT CALLED TO, READ BACK BY AND VERIFIED WITH:     ROGERS,S ON 04/07/13 AT 2315 BY LOY,C   Studies/Results: Dg Wrist Complete Left  04/09/2013   CLINICAL DATA:  Follow up fracture.  Splint removal.  EXAM: LEFT WRIST - COMPLETE 3+ VIEW  COMPARISON:  04/07/2013.  FINDINGS: The impacted fracture of the distal radius demonstrates no significant change. There is disruption of the dorsal cortex on the lateral view and fracture extension into the distal radioulnar joint which is mildly widened. Old  ulnar styloid fracture, TFCC chondrocalcinosis and advanced degenerative changes at the 1st metacarpal base are again noted. There is moderate soft tissue swelling within the distal forearm.  IMPRESSION: Unchanged alignment of impacted and mildly displaced distal radial fracture.   Electronically Signed   By: Roxy Horseman M.D.   On: 04/09/2013 17:36    Medications: I have reviewed the patient's current medications.  Assesment: Principal Problem:   Failure to thrive in adult Active Problems:   Essential hypertension, benign   Dementia with behavioral disturbance   Distal radial fracture   Atrial fibrillation   Dehydration   Acute renal failure   Multiple falls   Orthostatic hypotension    Plan: Medications reviewed Will discharge to nursing home. Discharge summary was done by Dr. Juanetta Gosling.    LOS: 4 days   Othniel Maret 04/11/2013, 8:15 AM

## 2013-04-11 NOTE — Progress Notes (Signed)
NAMEHAVEN, PYLANT                ACCOUNT NO.:  [id number]  MEDICAL RECORD NO.:  [id number]  LOCATION:  A326                          FACILITY:  [facility]  PHYSICIAN:  Fatemah Pourciau L. Juanetta Gosling, M.D.DATE OF BIRTH:  24-Jun-1923  DATE OF PROCEDURE:  04/09/2013 DATE OF DISCHARGE:                                PROGRESS NOTE   Ms. Franey has more confusion.  She actually took her cast off.  When I saw her earlier in the day, she was more alert and awake, seem to be doing well and was more oriented.  Her laboratory work shows that her creatinine is down to 2.  Her chest is clear.  Her heart is regular. She has malformation of the wrist.  She is in atrial fibrillation.  ASSESSMENT:  She has dehydration which has led to acute renal failure which is improved.  She is demented.  She has chronic atrial fibrillation and has been on Coumadin which will now be stopped and she has a fracture in her wrist.  I do not plan to change anything, but I did add some Ativan to see if it will help with her confusion.     Anwyn Kriegel L. Juanetta Gosling, M.D.     ELH/MEDQ  D:  04/10/2013  T:  04/10/2013  Job:  [id number]

## 2013-04-11 NOTE — Clinical Social Work Placement (Signed)
    Clinical Social Work Department CLINICAL SOCIAL WORK PLACEMENT NOTE 04/11/2013  Patient:  Sheri Matthews, Sheri Matthews  Account Number:  [id number] Admit date:  04/07/2013  Clinical Social Worker:  Sherrlyn Hock  Date/time:  04/08/2013 04:00 PM  Clinical Social Work is seeking post-discharge placement for this patient at the following level of care:   SKILLED NURSING   (*CSW will update this form in Epic as items are completed)   04/08/2013  Patient/family provided with [facility] Department of Clinical Social Work's list of facilities offering this level of care within the geographic area requested by the patient (or if unable, by the patient's family).  04/08/2013  Patient/family informed of their freedom to choose among providers that offer the needed level of care, that participate in Medicare, Medicaid or managed care program needed by the patient, have an available bed and are willing to accept the patient.  04/08/2013  Patient/family informed of [facility]' ownership interest in [facility], as well as of the fact that they are under no obligation to receive care at this facility.  PASARR submitted to EDS on  PASARR number received from EDS on   FL2 transmitted to all facilities in geographic area requested by pt/family on  04/08/2013 FL2 transmitted to all facilities within larger geographic area on   Patient informed that his/her managed care company has contracts with or will negotiate with  certain facilities, including the following:     Patient/family informed of bed offers received:  04/09/2013 Patient chooses bed at [facility] Physician recommends and patient chooses bed at  [facility]  Patient to be transferred to [facility] on  04/11/2013 Patient to be transferred to facility by [facility]  The following physician request were entered in Epic:   Additional Comments: Pt has existing pasarr number.  Santa Genera, LCSW Clinical Social Worker [phone])

## 2013-04-11 NOTE — Clinical Social Work Note (Signed)
Patient ready for discharge today, will admit to [facility].  Will be transported by [employer]. Facility and family both agreeable to transport.  FL2 reviewed w RN and updated as needed.  Discharge packet prepared and placed w shadow chart for transport.  Discharge summary faxed to facility via [facility].  RN asked to call report to SNF.  Family asked to complete preadmission paperwork w facility, facility advised that family will be following EMS and wants to sign papers upon arrival.  CSW signing off as no further SW needs identified.  Santa Genera, LCSW Clinical Social Worker [phone])

## 2014-09-22 ENCOUNTER — Emergency Department (HOSPITAL_COMMUNITY)
Admission: EM | Admit: 2014-09-22 | Discharge: 2014-09-22 | Disposition: A | Discharge status: Home / Self Care | Payer: Medicare Other | Attending: Emergency Medicine | Admitting: Emergency Medicine

## 2014-09-22 ENCOUNTER — Emergency Department (HOSPITAL_COMMUNITY): Payer: Medicare Other

## 2014-09-22 ENCOUNTER — Encounter (HOSPITAL_COMMUNITY): Payer: Self-pay | Admitting: Emergency Medicine

## 2014-09-22 DIAGNOSIS — Z87448 Personal history of other diseases of urinary system: Secondary | ICD-10-CM | POA: Diagnosis not present

## 2014-09-22 DIAGNOSIS — I4891 Unspecified atrial fibrillation: Secondary | ICD-10-CM | POA: Diagnosis not present

## 2014-09-22 DIAGNOSIS — K219 Gastro-esophageal reflux disease without esophagitis: Secondary | ICD-10-CM | POA: Diagnosis not present

## 2014-09-22 DIAGNOSIS — Z862 Personal history of diseases of the blood and blood-forming organs and certain disorders involving the immune mechanism: Secondary | ICD-10-CM | POA: Insufficient documentation

## 2014-09-22 DIAGNOSIS — R1013 Epigastric pain: Secondary | ICD-10-CM | POA: Insufficient documentation

## 2014-09-22 DIAGNOSIS — R079 Chest pain, unspecified: Secondary | ICD-10-CM

## 2014-09-22 DIAGNOSIS — F0391 Unspecified dementia with behavioral disturbance: Secondary | ICD-10-CM | POA: Diagnosis not present

## 2014-09-22 DIAGNOSIS — I10 Essential (primary) hypertension: Secondary | ICD-10-CM | POA: Diagnosis not present

## 2014-09-22 DIAGNOSIS — Z79899 Other long term (current) drug therapy: Secondary | ICD-10-CM | POA: Diagnosis not present

## 2014-09-22 DIAGNOSIS — Z8673 Personal history of transient ischemic attack (TIA), and cerebral infarction without residual deficits: Secondary | ICD-10-CM | POA: Insufficient documentation

## 2014-09-22 DIAGNOSIS — Z8701 Personal history of pneumonia (recurrent): Secondary | ICD-10-CM | POA: Insufficient documentation

## 2014-09-22 DIAGNOSIS — M1990 Unspecified osteoarthritis, unspecified site: Secondary | ICD-10-CM | POA: Diagnosis not present

## 2014-09-22 DIAGNOSIS — F329 Major depressive disorder, single episode, unspecified: Secondary | ICD-10-CM | POA: Insufficient documentation

## 2014-09-22 HISTORY — DX: Unspecified atrial fibrillation: I48.91

## 2014-09-22 LAB — CBC WITH DIFFERENTIAL/PLATELET
Basophils Absolute: 0 10*3/uL (ref 0.0–0.1)
Basophils Relative: 0 % (ref 0–1)
Eosinophils Absolute: 0.5 10*3/uL (ref 0.0–0.7)
Eosinophils Relative: 5 % (ref 0–5)
HCT: 36.3 % (ref 36.0–46.0)
Hemoglobin: 11.6 g/dL — ABNORMAL LOW (ref 12.0–15.0)
Lymphocytes Relative: 16 % (ref 12–46)
Lymphs Abs: 1.5 10*3/uL (ref 0.7–4.0)
MCH: 30 pg (ref 26.0–34.0)
MCHC: 32 g/dL (ref 30.0–36.0)
MCV: 93.8 fL (ref 78.0–100.0)
Monocytes Absolute: 0.8 10*3/uL (ref 0.1–1.0)
Monocytes Relative: 8 % (ref 3–12)
Neutro Abs: 6.6 10*3/uL (ref 1.7–7.7)
Neutrophils Relative %: 71 % (ref 43–77)
Platelets: 278 10*3/uL (ref 150–400)
RBC: 3.87 MIL/uL (ref 3.87–5.11)
RDW: 12.1 % (ref 11.5–15.5)
WBC: 9.3 10*3/uL (ref 4.0–10.5)

## 2014-09-22 LAB — TROPONIN I: Troponin I: <0.03 ng/mL (ref <0.031)

## 2014-09-22 LAB — BASIC METABOLIC PANEL
Anion gap: 7 (ref 5–15)
BUN: 33 mg/dL — ABNORMAL HIGH (ref 6–20)
CO2: 28 mmol/L (ref 22–32)
Calcium: 9.3 mg/dL (ref 8.9–10.3)
Chloride: 105 mmol/L (ref 101–111)
Creatinine, Ser: 1.97 mg/dL — ABNORMAL HIGH (ref 0.44–1.00)
GFR calc Af Amer: 25 mL/min — ABNORMAL LOW (ref >60)
GFR calc non Af Amer: 22 mL/min — ABNORMAL LOW (ref >60)
Glucose, Bld: 111 mg/dL — ABNORMAL HIGH (ref 70–99)
Potassium: 4.2 mmol/L (ref 3.5–5.1)
Sodium: 140 mmol/L (ref 135–145)

## 2014-09-22 MED ORDER — MORPHINE SULFATE 4 MG/ML IJ SOLN
4.0000 mg | Freq: Once | INTRAMUSCULAR | Status: AC
Start: 2014-09-22 — End: 2014-09-22
  Administered 2014-09-22: 4 mg via INTRAVENOUS
  Filled 2014-09-22: qty 1

## 2014-09-22 NOTE — ED Notes (Signed)
Pt given asa, nitro, and cardizem enroute to facility.

## 2014-09-22 NOTE — Discharge Instructions (Signed)

## 2014-09-22 NOTE — ED Provider Notes (Signed)
CSN: [id number]     Arrival date & time 09/22/14  1910 History   First MD Initiated Contact with Patient 09/22/14 1919     Chief Complaint  Patient presents with  . Chest Pain     (Consider location/radiation/quality/duration/timing/severity/associated sxs/prior Treatment) HPI   81 year old female with chest pain. Onset shortly before arrival. Sharp pain in epigastrium and lower sternal area. Worse with deep inspiration. No associated symptoms. Specifically denies palpitations, dyspnea. No nausea or vomiting. Symptoms relatively constant since onset. The p.m. she was sitting down and eating. No fevers or chills. No unusual leg pain or swelling. It does not appear she has a history of coronary artery disease.  Past Medical History  Diagnosis Date  . Hypertension   . Arthritis   . Shortness of breath   . Stroke   . GERD (gastroesophageal reflux disease)   . Dysrhythmia   . Pneumonia   . Depression   . Anemia   . Dementia with behavioral disturbance 04/10/2013  . Acute renal failure 04/10/2013  . A-fib    Past Surgical History  Procedure Laterality Date  . Back surgery    . Ovaries  removed   History reviewed. No pertinent family history. History  Substance Use Topics  . Smoking status: Never Smoker   . Smokeless tobacco: Never Used  . Alcohol Use: No   OB History    No data available     Review of Systems  All systems reviewed and negative, other than as noted in HPI.   Allergies  Review of patient's allergies indicates no known allergies.  Home Medications   Prior to Admission medications   Medication Sig Start Date End Date Taking? Authorizing Provider  ALPRAZolam (XANAX) 0.25 MG tablet Take 1 tablet (0.25 mg total) by mouth 3 (three) times daily as needed for anxiety (agitation). 04/10/13   Kari BaarsEdward Hawkins, MD  amitriptyline (ELAVIL) 25 MG tablet Take 25 mg by mouth at bedtime.    Historical Provider, MD  busPIRone (BUSPAR) 5 MG tablet Take 5 mg by mouth 2  (two) times daily.    Historical Provider, MD  citalopram (CELEXA) 20 MG tablet Take 20 mg by mouth daily. 03/04/13   Historical Provider, MD  diltiazem (CARDIZEM CD) 120 MG 24 hr capsule Take 1 capsule (120 mg total) by mouth daily. 04/10/13   Kari BaarsEdward Hawkins, MD  donepezil (ARICEPT) 5 MG tablet Take 5 mg by mouth at bedtime.     Historical Provider, MD  HYDROcodone-acetaminophen (NORCO/VICODIN) 5-325 MG per tablet Take 1 tablet by mouth every 6 (six) hours as needed for pain.    Historical Provider, MD  metoprolol (LOPRESSOR) 12.5 mg TABS tablet Take 2 tablets (50 mg total) by mouth 2 (two) times daily. 04/10/13   Kari BaarsEdward Hawkins, MD  omeprazole (PRILOSEC) 20 MG capsule Take 20 mg by mouth daily.    Historical Provider, MD  pravastatin (PRAVACHOL) 40 MG tablet Take 1 tablet (40 mg total) by mouth daily. 04/10/13   Kari BaarsEdward Hawkins, MD   BP 119/73 mmHg  Pulse 70  Temp(Src) 98.3 F (36.8 C)  Resp 17  Wt 125 lb (56.7 kg)  SpO2 98% Physical Exam  Constitutional: She appears well-developed and well-nourished. No distress.  HENT:  Head: Normocephalic and atraumatic.  Eyes: Conjunctivae are normal. Right eye exhibits no discharge. Left eye exhibits no discharge.  Neck: Neck supple.  Cardiovascular: Normal rate, regular rhythm and normal heart sounds.  Exam reveals no gallop and no friction rub.   No  murmur heard. Pulmonary/Chest: Effort normal and breath sounds normal. No respiratory distress. She exhibits tenderness.  Irregularly irregular. Tenderness to palpation over the mid to lower sternum and epigastrium. No overlying skin changes.  Abdominal: Soft. She exhibits no distension. There is no tenderness.  Musculoskeletal: She exhibits no edema or tenderness.  Lower extremities symmetric as compared to each other. No calf tenderness. Negative Homan's. No palpable cords.   Neurological: She is alert.  Skin: Skin is warm and dry. She is not diaphoretic.  Psychiatric: She has a normal mood and  affect. Her behavior is normal. Thought content normal.  Nursing note and vitals reviewed.   ED Course  Procedures (including critical care time) Labs Review Labs Reviewed  CBC WITH DIFFERENTIAL/PLATELET - Abnormal; Notable for the following:    Hemoglobin 11.6 (*)    All other components within normal limits  BASIC METABOLIC PANEL - Abnormal; Notable for the following:    Glucose, Bld 111 (*)    BUN 33 (*)    Creatinine, Ser 1.97 (*)    GFR calc non Af Amer 22 (*)    GFR calc Af Amer 25 (*)    All other components within normal limits  TROPONIN I    Imaging Review No results found.   EKG Interpretation   Date/Time:  Tuesday Sep 22 2014 19:20:08 EDT Ventricular Rate:  76 PR Interval:    QRS Duration: 104 QT Interval:  463 QTC Calculation: 521 R Axis:   58 Text Interpretation:  Atrial fibrillation Low voltage, precordial leads  Borderline T abnormalities, diffuse leads Prolonged QT interval Artifact  in lead(s) I II III aVR aVL ED PHYSICIAN INTERPRETATION AVAILABLE IN [facility] Confirmed by TEST, Record ([id number]) on 09/24/2014 3:38:24 PM      MDM   Final diagnoses:  Chest pain    81 year old female with chest pain. Seems most consistent with a musculoskeletal etiology. Pain is very reproducible with palpation. EKG shows atrial fibrillation with controlled rate. She has a history of the same. No acute ischemic changes. Doubt PE, dissection, infectious or other emergent pathology. It has been determined that no acute conditions requiring further emergency intervention are present at this time. The patient has been advised of the diagnosis and plan. I reviewed any labs and imaging including any potential incidental findings. We have discussed signs and symptoms that warrant return to the ED and they are listed in the discharge instructions.      Raeford RazorStephen Ahana Najera, MD 09/24/14 1630

## 2014-09-22 NOTE — ED Notes (Signed)
Ems called out due to choking on arrival pt c/o sob and chest pain.

## 2014-10-29 ENCOUNTER — Other Ambulatory Visit: Payer: Self-pay

## 2015-07-12 ENCOUNTER — Emergency Department (HOSPITAL_COMMUNITY): Payer: Medicare Other

## 2015-07-12 ENCOUNTER — Encounter (HOSPITAL_COMMUNITY): Payer: Self-pay

## 2015-07-12 ENCOUNTER — Inpatient Hospital Stay (HOSPITAL_COMMUNITY)
Admission: EM | Admit: 2015-07-12 | Discharge: 2015-07-14 | DRG: 065 | Disposition: A | Discharge status: Home Health | Payer: Medicare Other | Attending: Pulmonary Disease | Admitting: Pulmonary Disease

## 2015-07-12 DIAGNOSIS — N189 Chronic kidney disease, unspecified: Secondary | ICD-10-CM | POA: Diagnosis present

## 2015-07-12 DIAGNOSIS — E860 Dehydration: Secondary | ICD-10-CM | POA: Diagnosis present

## 2015-07-12 DIAGNOSIS — I482 Chronic atrial fibrillation: Secondary | ICD-10-CM | POA: Diagnosis present

## 2015-07-12 DIAGNOSIS — R296 Repeated falls: Secondary | ICD-10-CM | POA: Diagnosis present

## 2015-07-12 DIAGNOSIS — K219 Gastro-esophageal reflux disease without esophagitis: Secondary | ICD-10-CM | POA: Diagnosis present

## 2015-07-12 DIAGNOSIS — Z8711 Personal history of peptic ulcer disease: Secondary | ICD-10-CM | POA: Diagnosis not present

## 2015-07-12 DIAGNOSIS — J209 Acute bronchitis, unspecified: Secondary | ICD-10-CM

## 2015-07-12 DIAGNOSIS — I129 Hypertensive chronic kidney disease with stage 1 through stage 4 chronic kidney disease, or unspecified chronic kidney disease: Secondary | ICD-10-CM | POA: Diagnosis present

## 2015-07-12 DIAGNOSIS — W19XXXA Unspecified fall, initial encounter: Secondary | ICD-10-CM

## 2015-07-12 DIAGNOSIS — Z7982 Long term (current) use of aspirin: Secondary | ICD-10-CM | POA: Diagnosis not present

## 2015-07-12 DIAGNOSIS — N179 Acute kidney failure, unspecified: Secondary | ICD-10-CM | POA: Diagnosis present

## 2015-07-12 DIAGNOSIS — R269 Unspecified abnormalities of gait and mobility: Secondary | ICD-10-CM

## 2015-07-12 DIAGNOSIS — R627 Adult failure to thrive: Secondary | ICD-10-CM | POA: Diagnosis present

## 2015-07-12 DIAGNOSIS — I4891 Unspecified atrial fibrillation: Secondary | ICD-10-CM | POA: Diagnosis present

## 2015-07-12 DIAGNOSIS — I639 Cerebral infarction, unspecified: Principal | ICD-10-CM | POA: Diagnosis present

## 2015-07-12 DIAGNOSIS — I6789 Other cerebrovascular disease: Secondary | ICD-10-CM | POA: Diagnosis not present

## 2015-07-12 DIAGNOSIS — R531 Weakness: Secondary | ICD-10-CM

## 2015-07-12 HISTORY — DX: Adult failure to thrive: R62.7

## 2015-07-12 HISTORY — DX: Orthostatic hypotension: I95.1

## 2015-07-12 LAB — URINALYSIS, ROUTINE W REFLEX MICROSCOPIC
BILIRUBIN URINE: NEGATIVE
GLUCOSE, UA: NEGATIVE mg/dL
HGB URINE DIPSTICK: NEGATIVE
KETONES UR: NEGATIVE mg/dL
NITRITE: NEGATIVE
PH: 5 (ref 5.0–8.0)
Protein, ur: NEGATIVE mg/dL
Specific Gravity, Urine: 1.02 (ref 1.005–1.030)

## 2015-07-12 LAB — COMPREHENSIVE METABOLIC PANEL
ALT: 12 U/L — AB (ref 14–54)
AST: 21 U/L (ref 15–41)
Albumin: 2.7 g/dL — ABNORMAL LOW (ref 3.5–5.0)
Alkaline Phosphatase: 71 U/L (ref 38–126)
Anion gap: 13 (ref 5–15)
BUN: 35 mg/dL — AB (ref 6–20)
CALCIUM: 9.7 mg/dL (ref 8.9–10.3)
CO2: 22 mmol/L (ref 22–32)
CREATININE: 1.43 mg/dL — AB (ref 0.44–1.00)
Chloride: 103 mmol/L (ref 101–111)
GFR calc non Af Amer: 31 mL/min — ABNORMAL LOW (ref >60)
GFR, EST AFRICAN AMERICAN: 36 mL/min — AB (ref >60)
Glucose, Bld: 93 mg/dL (ref 65–99)
Potassium: 3.7 mmol/L (ref 3.5–5.1)
SODIUM: 138 mmol/L (ref 135–145)
Total Bilirubin: <0.1 mg/dL — ABNORMAL LOW (ref 0.3–1.2)
Total Protein: 6.6 g/dL (ref 6.5–8.1)

## 2015-07-12 LAB — CBC WITH DIFFERENTIAL/PLATELET
Basophils Absolute: 0 10*3/uL (ref 0.0–0.1)
Basophils Relative: 0 %
Eosinophils Absolute: 0.1 10*3/uL (ref 0.0–0.7)
Eosinophils Relative: 1 %
HCT: 32.9 % — ABNORMAL LOW (ref 36.0–46.0)
HEMOGLOBIN: 10.6 g/dL — AB (ref 12.0–15.0)
LYMPHS ABS: 1.5 10*3/uL (ref 0.7–4.0)
Lymphocytes Relative: 27 %
MCH: 27.3 pg (ref 26.0–34.0)
MCHC: 32.2 g/dL (ref 30.0–36.0)
MCV: 84.8 fL (ref 78.0–100.0)
Monocytes Absolute: 0.9 10*3/uL (ref 0.1–1.0)
Monocytes Relative: 15 %
Neutro Abs: 3.2 10*3/uL (ref 1.7–7.7)
Neutrophils Relative %: 57 %
Platelets: 403 10*3/uL — ABNORMAL HIGH (ref 150–400)
RBC: 3.88 MIL/uL (ref 3.87–5.11)
RDW: 12.6 % (ref 11.5–15.5)
WBC: 5.6 10*3/uL (ref 4.0–10.5)

## 2015-07-12 LAB — URINE MICROSCOPIC-ADD ON: RBC / HPF: NONE SEEN RBC/hpf (ref 0–5)

## 2015-07-12 LAB — TROPONIN I: Troponin I: <0.03 ng/mL (ref <0.031)

## 2015-07-12 MED ORDER — BUSPIRONE HCL 5 MG PO TABS
2.5000 mg | ORAL_TABLET | Freq: Two times a day (BID) | ORAL | Status: DC
  Administered 2015-07-12 – 2015-07-14 (×3): 2.5 mg via ORAL
  Filled 2015-07-12 (×4): qty 1

## 2015-07-12 MED ORDER — VITAMIN B-12 1000 MCG PO TABS
1000.0000 ug | ORAL_TABLET | Freq: Every day | ORAL | Status: DC
  Administered 2015-07-13: 1000 ug via ORAL
  Filled 2015-07-12 (×2): qty 1

## 2015-07-12 MED ORDER — SODIUM CHLORIDE 0.9 % IV BOLUS (SEPSIS)
1000.0000 mL | Freq: Once | INTRAVENOUS | Status: AC
  Administered 2015-07-12: 1000 mL via INTRAVENOUS

## 2015-07-12 MED ORDER — PANTOPRAZOLE SODIUM 40 MG PO TBEC
40.0000 mg | DELAYED_RELEASE_TABLET | Freq: Every day | ORAL | Status: DC
  Administered 2015-07-13: 40 mg via ORAL
  Filled 2015-07-12 (×2): qty 1

## 2015-07-12 MED ORDER — METOPROLOL TARTRATE 50 MG PO TABS
50.0000 mg | ORAL_TABLET | Freq: Two times a day (BID) | ORAL | Status: DC
  Administered 2015-07-12 – 2015-07-14 (×3): 50 mg via ORAL
  Filled 2015-07-12 (×5): qty 1

## 2015-07-12 MED ORDER — ADULT MULTIVITAMIN W/MINERALS CH
1.0000 | ORAL_TABLET | Freq: Every day | ORAL | Status: DC
  Administered 2015-07-13: 1 via ORAL
  Filled 2015-07-12 (×2): qty 1

## 2015-07-12 MED ORDER — VITAMIN D 1000 UNITS PO TABS
1000.0000 [IU] | ORAL_TABLET | Freq: Every day | ORAL | Status: DC
  Administered 2015-07-13: 1000 [IU] via ORAL
  Filled 2015-07-12 (×2): qty 1

## 2015-07-12 MED ORDER — ACETAMINOPHEN 500 MG PO TABS
500.0000 mg | ORAL_TABLET | Freq: Four times a day (QID) | ORAL | Status: DC | PRN
  Administered 2015-07-12 – 2015-07-14 (×2): 500 mg via ORAL
  Filled 2015-07-12 (×2): qty 1

## 2015-07-12 MED ORDER — CITALOPRAM HYDROBROMIDE 20 MG PO TABS
20.0000 mg | ORAL_TABLET | Freq: Every day | ORAL | Status: DC
  Administered 2015-07-13: 20 mg via ORAL
  Filled 2015-07-12 (×2): qty 1

## 2015-07-12 MED ORDER — OMEGA-3-ACID ETHYL ESTERS 1 G PO CAPS
1.0000 | ORAL_CAPSULE | Freq: Two times a day (BID) | ORAL | Status: DC
  Administered 2015-07-12 – 2015-07-13 (×2): 1 g via ORAL
  Filled 2015-07-12 (×4): qty 1

## 2015-07-12 MED ORDER — ASPIRIN EC 81 MG PO TBEC
81.0000 mg | DELAYED_RELEASE_TABLET | Freq: Every day | ORAL | Status: DC
  Administered 2015-07-13: 81 mg via ORAL
  Filled 2015-07-12: qty 1

## 2015-07-12 MED ORDER — SENNOSIDES-DOCUSATE SODIUM 8.6-50 MG PO TABS: 1.0000 | ORAL_TABLET | Freq: Every evening | ORAL | Status: DC | PRN

## 2015-07-12 MED ORDER — STROKE: EARLY STAGES OF RECOVERY BOOK
Freq: Once | Status: DC
  Filled 2015-07-12: qty 1

## 2015-07-12 MED ORDER — LEVOFLOXACIN 500 MG PO TABS: 500.0000 mg | ORAL_TABLET | ORAL | Status: DC

## 2015-07-12 MED ORDER — LEVOFLOXACIN 750 MG PO TABS
750.0000 mg | ORAL_TABLET | Freq: Once | ORAL | Status: AC
  Administered 2015-07-12: 750 mg via ORAL
  Filled 2015-07-12: qty 1

## 2015-07-12 MED ORDER — DONEPEZIL HCL 5 MG PO TABS
10.0000 mg | ORAL_TABLET | Freq: Every day | ORAL | Status: DC
  Administered 2015-07-12: 10 mg via ORAL
  Filled 2015-07-12 (×2): qty 2

## 2015-07-12 MED ORDER — LATANOPROST 0.005 % OP SOLN
1.0000 [drp] | Freq: Every day | OPHTHALMIC | Status: DC
  Filled 2015-07-12: qty 2.5

## 2015-07-12 MED ORDER — DILTIAZEM HCL ER COATED BEADS 120 MG PO CP24
120.0000 mg | ORAL_CAPSULE | Freq: Every day | ORAL | Status: DC
  Administered 2015-07-13 – 2015-07-14 (×2): 120 mg via ORAL
  Filled 2015-07-12 (×2): qty 1

## 2015-07-12 MED ORDER — CALCIUM CARBONATE-VITAMIN D 500-200 MG-UNIT PO TABS
1.0000 | ORAL_TABLET | Freq: Two times a day (BID) | ORAL | Status: DC
  Administered 2015-07-12 – 2015-07-13 (×2): 1 via ORAL
  Filled 2015-07-12 (×4): qty 1

## 2015-07-12 MED ORDER — HALOPERIDOL LACTATE 5 MG/ML IJ SOLN
1.0000 mg | Freq: Once | INTRAMUSCULAR | Status: DC
  Filled 2015-07-12: qty 1

## 2015-07-12 MED ORDER — SODIUM CHLORIDE 0.9 % IV SOLN
INTRAVENOUS | Status: DC
  Administered 2015-07-12: via INTRAVENOUS
  Administered 2015-07-13: 1 mL via INTRAVENOUS

## 2015-07-12 NOTE — ED Notes (Signed)
Pt resident of [facility].  EMS says for the past 2 weeks pt has not been eating well and has had a cough.   Pt has fallen several times recently and had an xray 2 weeks ago of left leg but was "inconclusive."

## 2015-07-12 NOTE — H&P (Signed)
[facility] History and Physical  PAMALEE MARCOE ZOX:[id number] DOB: March 15, 1924 DOA: 07/12/2015  Referring physician: Dr Manus Gunning PCP: Fredirick Maudlin, MD   Chief Complaint: Poor eating, cough, several falls recently   HPI: Sheri Matthews is a 82 y.o. female with hx of HTN, chron vertigo, TIA, anemia/ PUD, and more recently worsening dementia over the past few years.  Presenting with poor po intake, cough and falling.   Vitals OK in ED, labs show creat 1.43, alb 2.7, Hb 10.6 otherwise wnl.  Denies dysuria, CP, fevers.      Chart review: 2011 - HTN, DJD, vertigo, hx TIA/HL > dizziness, low BP's, low HB 8. Heme +, had EDG and colon done > diverticulae and ulcer near ileocicum biopsied.  Mult gastric erosion, 2 ulcers, prob due to nsaids.  2012 - 86 yo in ALF ^cough, dizzines (hx chron vertigo). High BNP, CXR vol overload.  Rx with steroid for ashtmatic bronchitis. ECHO negative. UTI Rx cipro.  Carot dopp =stenosis, f/u as OP.   2012 - dementia pt w fall and afib w RVR which was new.  Rx po dilt/ Lopressor.  ECHO ok.  COumadin. DC'd.  Jan 2013 - 2 falls, taking ativan for insomnia and took too many.  AKI creat >3, high K 7.5.  rx with kayex, IVF, better.  Feb 2014 - afib with RVR, recent GI illness.  hR 150 , ruled out for MI.  Increased po dilt dose from 180 > 240 mg/ day.  DC's.   Nov 2014 - Failure to thrive in adult, Essential hypertension, Dementia with behavioral disturbance, Distal radial fracture, Atrial fibrillation, Dehydration, Acute renal failure, Multiple falls, Orthostatic hypotension  ROS  denies CP  no joint pain   no HA  no blurry vision  no rash  no diarrhea  no nausea/ vomiting  no dysuria  no difficulty voiding  no change in urine color    Where does patient live ALF Can patient participate in ADLs? some  Past Medical History  Past Medical History  Diagnosis Date  . Hypertension   . Arthritis   . Shortness of breath   . Stroke (HCC)   . GERD  (gastroesophageal reflux disease)   . Dysrhythmia   . Pneumonia   . Depression   . Anemia   . Dementia with behavioral disturbance 04/10/2013  . Acute renal failure (HCC) 04/10/2013  . A-fib (HCC)   . Failure to thrive in adult   . Orthostatic hypotension   . Depression    Past Surgical History  Past Surgical History  Procedure Laterality Date  . Back surgery    . Ovaries  removed   Family History No family history on file. Social History  reports that she has never smoked. She has never used smokeless tobacco. She reports that she does not drink alcohol or use illicit drugs. Allergies No Known Allergies Home medications Prior to Admission medications   Medication Sig Start Date End Date Taking? Authorizing Provider  acetaminophen (TYLENOL) 500 MG tablet Take 500 mg by mouth every 6 (six) hours as needed for mild pain or moderate pain.   Yes Historical Provider, MD  aspirin EC 81 MG tablet Take 81 mg by mouth daily.   Yes Historical Provider, MD  busPIRone (BUSPAR) 5 MG tablet Take 2.5 mg by mouth 2 (two) times daily.    Yes Historical Provider, MD  calcium-vitamin D (OSCAL WITH D) 500-200 MG-UNIT per tablet Take 1 tablet by mouth 2 (two) times daily.  Yes Historical Provider, MD  cholecalciferol (VITAMIN D) 1000 UNITS tablet Take 1,000 Units by mouth daily.   Yes Historical Provider, MD  citalopram (CELEXA) 20 MG tablet Take 20 mg by mouth daily. 03/04/13  Yes Historical Provider, MD  diltiazem (CARDIZEM CD) 120 MG 24 hr capsule Take 1 capsule (120 mg total) by mouth daily. 04/10/13  Yes Kari Baars, MD  donepezil (ARICEPT) 10 MG tablet Take 10 mg by mouth at bedtime.   Yes Historical Provider, MD  fluticasone (FLONASE) 50 MCG/ACT nasal spray Place 2 sprays into both nostrils daily.   Yes Historical Provider, MD  latanoprost (XALATAN) 0.005 % ophthalmic solution Place 1 drop into the right eye at bedtime.   Yes Historical Provider, MD  loratadine (CLARITIN) 10 MG tablet Take 10  mg by mouth daily.   Yes Historical Provider, MD  Multiple Vitamin (MULTIVITAMIN WITH MINERALS) TABS tablet Take 1 tablet by mouth daily.   Yes Historical Provider, MD  Omega 3 1000 MG CAPS Take 1 capsule by mouth 2 (two) times daily.   Yes Historical Provider, MD  omeprazole (PRILOSEC) 20 MG capsule Take 20 mg by mouth daily.   Yes Historical Provider, MD  vitamin B-12 (CYANOCOBALAMIN) 1000 MCG tablet Take 1,000 mcg by mouth daily.   Yes Historical Provider, MD  ALPRAZolam (XANAX) 0.25 MG tablet Take 1 tablet (0.25 mg total) by mouth 3 (three) times daily as needed for anxiety (agitation). Patient not taking: Reported on 09/22/2014 04/10/13   Kari Baars, MD  metoprolol (LOPRESSOR) 12.5 mg TABS tablet Take 2 tablets (50 mg total) by mouth 2 (two) times daily. Patient not taking: Reported on 09/22/2014 04/10/13   Kari Baars, MD   Liver Function Tests  Recent Labs Lab 07/12/15 1302  AST 21  ALT 12*  ALKPHOS 71  BILITOT <0.1*  PROT 6.6  ALBUMIN 2.7*   No results for input(s): LIPASE, AMYLASE in the last 168 hours. CBC  Recent Labs Lab 07/12/15 1302  WBC 5.6  NEUTROABS 3.2  HGB 10.6*  HCT 32.9*  MCV 84.8  PLT 403*   Basic Metabolic Panel  Recent Labs Lab 07/12/15 1302  NA 138  K 3.7  CL 103  CO2 22  GLUCOSE 93  BUN 35*  CREATININE 1.43*  CALCIUM 9.7     Filed Vitals:   07/12/15 1430 07/12/15 1500 07/12/15 1530 07/12/15 1735  BP: 138/105 118/73 130/76 132/68  Pulse: 80 70 98 65  Temp:      TempSrc:      Resp: Height:      Weight:      SpO2: 100% 96% 98% 98%   Exam: Elderly no distress No rash, cyanosis or gangrene Sclera anicteric, throat clear Pupils unequal slightly, good gaze No jvd Chest crackles R base, L clear Irreg irreg no MRG Abd soft ntnd no ascites or mass or hsm GU defer MS chron joint changes bilat knees, no pain w ROM, no sig effusion No LE edema Neuro pleasant, moves all 4 ext, bilat LE's weak, L > R   EKG  (independently reviewed) > afib, HF 80, no acute changes CXR (independently reviewed) > no acute cardiopulm abnormality  Home meds > asa, Buspar, Celexa, Cardizem CD,  Aricept, flonase, Prilosec, Xanax prn, vitamins, metoprolol 12.5 bid  Assessment: 1 Gen weakness/ gait failure/ falling - possibly from CVA/ bronchitis. Has been on Buspar and Aricept a long time per family.  Will dc xanax prn and Claritin as may be  oversedating.  BP's ok here, on dilt/ MTP for afib. Will try some IVF's as well 2 Acute cerebellar CVA - on ASA already, neuro consult in am. Small infarct.  3 CKD baseline creat 1.1- 1.6. Creat today 1.4, stable 4 Prod cough -  bronchitis 5 Gait failure - combination of deconditioning/ bronchitis/ CVA 6 Chron afib - on Cardizem/ MTP 7 Psych / dementia - on buspirone, Celexa, prn Xanax, Aricept       Plan - not sure why pt is not walking now, progressive deconditioning and/or polypharm and/or acute bronchitis and/or CVA. Plan as above   DVT Prophylaxis SCD's  Code Status: full  Family Communication: at bedside  Disposition Plan: back to ALF when better    Maree Krabbe [facility] Pager [phone]  Cell [phone]  If 7PM-7AM, please contact night-coverage [email or web address] Password [facility] 07/12/2015, 6:58 PM

## 2015-07-12 NOTE — Progress Notes (Signed)
Pharmacy Antibiotic Note  Sheri Matthews is a 82 y.o. female admitted on 07/12/2015 with bronchitis.  Pharmacy has been consulted for levaquin dosing.  Plan: Levaquin 750 mg po x 1 then 500 mg q48 hours F/u renal function, cultures and clinical course  Height:  (157.5 cm) Weight: 125 lb 10.6 oz (57 kg) IBW/kg (Calculated) : 50.1  Temp (24hrs), Avg:98.4 F (36.9 C), Min:98.1 F (36.7 C), Max:98.8 F (37.1 C)   Recent Labs Lab 07/12/15 1302  WBC 5.6  CREATININE 1.43*    Estimated Creatinine Clearance: 21.1 mL/min (by C-G formula based on Cr of 1.43).    No Known Allergies  Antimicrobials this admission: levaquin  2/27 >>    Thank you for allowing pharmacy to be a part of this patient's care.  Talbert Cage [place] 07/12/2015 8:11 PM

## 2015-07-12 NOTE — H&P (Deleted)
[facility] History and Physical  PAMALEE MARCOE ZOX:[id number] DOB: March 15, 1924 DOA: 07/12/2015  Referring physician: Dr Manus Gunning PCP: Fredirick Maudlin, MD   Chief Complaint: Poor eating, cough, several falls recently   HPI: Sheri Matthews is a 82 y.o. female with hx of HTN, chron vertigo, TIA, anemia/ PUD, and more recently worsening dementia over the past few years.  Presenting with poor po intake, cough and falling.   Vitals OK in ED, labs show creat 1.43, alb 2.7, Hb 10.6 otherwise wnl.  Denies dysuria, CP, fevers.      Chart review: 2011 - HTN, DJD, vertigo, hx TIA/HL > dizziness, low BP's, low HB 8. Heme +, had EDG and colon done > diverticulae and ulcer near ileocicum biopsied.  Mult gastric erosion, 2 ulcers, prob due to nsaids.  2012 - 86 yo in ALF ^cough, dizzines (hx chron vertigo). High BNP, CXR vol overload.  Rx with steroid for ashtmatic bronchitis. ECHO negative. UTI Rx cipro.  Carot dopp =stenosis, f/u as OP.   2012 - dementia pt w fall and afib w RVR which was new.  Rx po dilt/ Lopressor.  ECHO ok.  COumadin. DC'd.  Jan 2013 - 2 falls, taking ativan for insomnia and took too many.  AKI creat >3, high K 7.5.  rx with kayex, IVF, better.  Feb 2014 - afib with RVR, recent GI illness.  hR 150 , ruled out for MI.  Increased po dilt dose from 180 > 240 mg/ day.  DC's.   Nov 2014 - Failure to thrive in adult, Essential hypertension, Dementia with behavioral disturbance, Distal radial fracture, Atrial fibrillation, Dehydration, Acute renal failure, Multiple falls, Orthostatic hypotension  ROS  denies CP  no joint pain   no HA  no blurry vision  no rash  no diarrhea  no nausea/ vomiting  no dysuria  no difficulty voiding  no change in urine color    Where does patient live ALF Can patient participate in ADLs? some  Past Medical History  Past Medical History  Diagnosis Date  . Hypertension   . Arthritis   . Shortness of breath   . Stroke (HCC)   . GERD  (gastroesophageal reflux disease)   . Dysrhythmia   . Pneumonia   . Depression   . Anemia   . Dementia with behavioral disturbance 04/10/2013  . Acute renal failure (HCC) 04/10/2013  . A-fib (HCC)   . Failure to thrive in adult   . Orthostatic hypotension   . Depression    Past Surgical History  Past Surgical History  Procedure Laterality Date  . Back surgery    . Ovaries  removed   Family History No family history on file. Social History  reports that she has never smoked. She has never used smokeless tobacco. She reports that she does not drink alcohol or use illicit drugs. Allergies No Known Allergies Home medications Prior to Admission medications   Medication Sig Start Date End Date Taking? Authorizing Provider  acetaminophen (TYLENOL) 500 MG tablet Take 500 mg by mouth every 6 (six) hours as needed for mild pain or moderate pain.   Yes Historical Provider, MD  aspirin EC 81 MG tablet Take 81 mg by mouth daily.   Yes Historical Provider, MD  busPIRone (BUSPAR) 5 MG tablet Take 2.5 mg by mouth 2 (two) times daily.    Yes Historical Provider, MD  calcium-vitamin D (OSCAL WITH D) 500-200 MG-UNIT per tablet Take 1 tablet by mouth 2 (two) times daily.  Yes Historical Provider, MD  cholecalciferol (VITAMIN D) 1000 UNITS tablet Take 1,000 Units by mouth daily.   Yes Historical Provider, MD  citalopram (CELEXA) 20 MG tablet Take 20 mg by mouth daily. 03/04/13  Yes Historical Provider, MD  diltiazem (CARDIZEM CD) 120 MG 24 hr capsule Take 1 capsule (120 mg total) by mouth daily. 04/10/13  Yes Kari Baars, MD  donepezil (ARICEPT) 10 MG tablet Take 10 mg by mouth at bedtime.   Yes Historical Provider, MD  fluticasone (FLONASE) 50 MCG/ACT nasal spray Place 2 sprays into both nostrils daily.   Yes Historical Provider, MD  latanoprost (XALATAN) 0.005 % ophthalmic solution Place 1 drop into the right eye at bedtime.   Yes Historical Provider, MD  loratadine (CLARITIN) 10 MG tablet Take 10  mg by mouth daily.   Yes Historical Provider, MD  Multiple Vitamin (MULTIVITAMIN WITH MINERALS) TABS tablet Take 1 tablet by mouth daily.   Yes Historical Provider, MD  Omega 3 1000 MG CAPS Take 1 capsule by mouth 2 (two) times daily.   Yes Historical Provider, MD  omeprazole (PRILOSEC) 20 MG capsule Take 20 mg by mouth daily.   Yes Historical Provider, MD  vitamin B-12 (CYANOCOBALAMIN) 1000 MCG tablet Take 1,000 mcg by mouth daily.   Yes Historical Provider, MD  ALPRAZolam (XANAX) 0.25 MG tablet Take 1 tablet (0.25 mg total) by mouth 3 (three) times daily as needed for anxiety (agitation). Patient not taking: Reported on 09/22/2014 04/10/13   Kari Baars, MD  metoprolol (LOPRESSOR) 12.5 mg TABS tablet Take 2 tablets (50 mg total) by mouth 2 (two) times daily. Patient not taking: Reported on 09/22/2014 04/10/13   Kari Baars, MD   Liver Function Tests  Recent Labs Lab 07/12/15 1302  AST 21  ALT 12*  ALKPHOS 71  BILITOT <0.1*  PROT 6.6  ALBUMIN 2.7*   No results for input(s): LIPASE, AMYLASE in the last 168 hours. CBC  Recent Labs Lab 07/12/15 1302  WBC 5.6  NEUTROABS 3.2  HGB 10.6*  HCT 32.9*  MCV 84.8  PLT 403*   Basic Metabolic Panel  Recent Labs Lab 07/12/15 1302  NA 138  K 3.7  CL 103  CO2 22  GLUCOSE 93  BUN 35*  CREATININE 1.43*  CALCIUM 9.7     Filed Vitals:   07/12/15 1300 07/12/15 1330 07/12/15 1411 07/12/15 1735  BP: 106/27 102/78 123/64 132/68  Pulse: 80 83 60 65  Temp:      TempSrc:      Resp: Height:      Weight:      SpO2: 100% 97% 97% 98%   Exam: Elderly no distress No rash, cyanosis or gangrene Sclera anicteric, throat clear Pupils unequal slightly, good gaze No jvd Chest crackles R base, L clear Irreg irreg no MRG Abd soft ntnd no ascites or mass or hsm GU defer MS chron joint changes bilat knees, no pain w ROM, no sig effusion No LE edema Neuro pleasant, moves all 4 ext, bilat LE's weak, L > R   EKG  (independently reviewed) > afib, HF 80, no acute changes CXR (independently reviewed) > no acute cardiopulm abnormality  Home meds > asa, Buspar, Celexa, Cardizem CD,  Aricept, flonase, Prilosec, Xanax prn, vitamins, metoprolol 12.5 bid  Assessment: 1 Gen weakness/ gait failure/ falling - possibly from CVA/ bronchitis. Has been on Buspar and Aricept a long time per family.  Will dc xanax prn and Claritin as may be  oversedating.  BP's ok here, on dilt/ MTP for afib. Will try some IVF's as well 2 Acute cerebellar CVA - on ASA already, neuro consult in am. Small infarct.  3 CKD baseline creat 1.1- 1.6. Creat today 1.4, stable 4 Prod cough -  bronchitis 5 Gait failure - combination of deconditioning/ bronchitis/ CVA 6 Chron afib - on Cardizem/ MTP 7 Psych / dementia - on buspirone, Celexa, prn Xanax, Aricept       Plan - not sure why pt is not walking now, progressive deconditioning and/or polypharm and/or acute bronchitis and/or CVA. Plan as above   DVT Prophylaxis SCD's  Code Status: full  Family Communication: at bedside  Disposition Plan: back to ALF when better    Maree Krabbe [facility] Pager [phone]  Cell [phone]  If 7PM-7AM, please contact night-coverage [email or web address] Password [facility] 07/12/2015, 5:46 PM

## 2015-07-12 NOTE — ED Notes (Signed)
Lab at bedside. Pt to be taken to xray once lab obtains sample.

## 2015-07-12 NOTE — ED Provider Notes (Signed)
CSN: [id number]     Arrival date & time 07/12/15  1230 History  By signing my name below, I, Phillis Haggis, attest that this documentation has been prepared under the direction and in the presence of Glynn Octave, MD. Electronically Signed: Phillis Haggis, ED Scribe. 07/12/2015. 9:19 AM.  No chief complaint on file.  The history is provided by the EMS personnel. No language interpreter was used.  HPI Comments (Level 5 Caveat due to dementia): Sheri Matthews is a 82 y.o. Female with a hx of HTN, arthritis, stroke, dysrhythmia, dementia with behavioral disturbance, acute renal failure, A-Fib, and failure to thrive brought in by EMS from [facility] nursing home who presents to the Emergency Department complaining of a fall onset PTA. Pt states that she is ambulatory with a walker at home. Per triage note, EMS reports that pt has not been eating well for 2 weeks, has had a cough, and fallen several times. Pt said she did not fall and has been eating well. They also report that she had an x-ray performed of the left leg with "inconclusive results." She complains of left toes and left knee pain; she said she fell a year ago and has been having pain ever since. Pt denies SOB, abdominal pain, chest pain, headache, nausea, or vomiting.   Past Medical History  Diagnosis Date  . Hypertension   . Arthritis   . Shortness of breath   . Stroke (HCC)   . GERD (gastroesophageal reflux disease)   . Dysrhythmia   . Pneumonia   . Depression   . Anemia   . Dementia with behavioral disturbance 04/10/2013  . Acute renal failure (HCC) 04/10/2013  . A-fib (HCC)   . Failure to thrive in adult   . Orthostatic hypotension   . Depression    Past Surgical History  Procedure Laterality Date  . Back surgery    . Ovaries  removed   History reviewed. No pertinent family history. Social History  Substance Use Topics  . Smoking status: Never Smoker   . Smokeless tobacco: Never Used  . Alcohol Use: No   OB  History    No data available     Review of Systems  Unable to perform ROS: Dementia      Allergies  Review of patient's allergies indicates no known allergies.  Home Medications   Prior to Admission medications   Medication Sig Start Date End Date Taking? Authorizing Provider  acetaminophen (TYLENOL) 500 MG tablet Take 500 mg by mouth every 6 (six) hours as needed for mild pain or moderate pain.   Yes Historical Provider, MD  aspirin EC 81 MG tablet Take 81 mg by mouth daily.   Yes Historical Provider, MD  busPIRone (BUSPAR) 5 MG tablet Take 2.5 mg by mouth 2 (two) times daily.    Yes Historical Provider, MD  calcium-vitamin D (OSCAL WITH D) 500-200 MG-UNIT per tablet Take 1 tablet by mouth 2 (two) times daily.   Yes Historical Provider, MD  cholecalciferol (VITAMIN D) 1000 UNITS tablet Take 1,000 Units by mouth daily.   Yes Historical Provider, MD  citalopram (CELEXA) 20 MG tablet Take 20 mg by mouth daily. 03/04/13  Yes Historical Provider, MD  diltiazem (CARDIZEM CD) 120 MG 24 hr capsule Take 1 capsule (120 mg total) by mouth daily. 04/10/13  Yes Kari Baars, MD  donepezil (ARICEPT) 10 MG tablet Take 10 mg by mouth at bedtime.   Yes Historical Provider, MD  fluticasone (FLONASE) 50 MCG/ACT nasal  spray Place 2 sprays into both nostrils daily.   Yes Historical Provider, MD  latanoprost (XALATAN) 0.005 % ophthalmic solution Place 1 drop into the right eye at bedtime.   Yes Historical Provider, MD  loratadine (CLARITIN) 10 MG tablet Take 10 mg by mouth daily.   Yes Historical Provider, MD  Multiple Vitamin (MULTIVITAMIN WITH MINERALS) TABS tablet Take 1 tablet by mouth daily.   Yes Historical Provider, MD  Omega 3 1000 MG CAPS Take 1 capsule by mouth 2 (two) times daily.   Yes Historical Provider, MD  omeprazole (PRILOSEC) 20 MG capsule Take 20 mg by mouth daily.   Yes Historical Provider, MD  vitamin B-12 (CYANOCOBALAMIN) 1000 MCG tablet Take 1,000 mcg by mouth daily.   Yes  Historical Provider, MD  ALPRAZolam (XANAX) 0.25 MG tablet Take 1 tablet (0.25 mg total) by mouth 3 (three) times daily as needed for anxiety (agitation). Patient not taking: Reported on 09/22/2014 04/10/13   Kari Baars, MD  metoprolol (LOPRESSOR) 12.5 mg TABS tablet Take 2 tablets (50 mg total) by mouth 2 (two) times daily. Patient not taking: Reported on 09/22/2014 04/10/13   Kari Baars, MD   BP 130/74 mmHg  Pulse 69  Temp(Src) 98.7 F (37.1 C) (Oral)  Resp 20  Ht  (1.626 m)  Wt 125 lb 10.6 oz (57 kg)  BMI 21.56 kg/m2  SpO2 96% Physical Exam  Constitutional: She appears well-developed and well-nourished. No distress.  HENT:  Head: Normocephalic and atraumatic.  Mouth/Throat: Oropharynx is clear and moist. Mucous membranes are dry. No oropharyngeal exudate.  right ptosis  Eyes: Conjunctivae and EOM are normal. Pupils are equal, round, and reactive to light.  Neck: Normal range of motion. Neck supple.  No meningismus.  Cardiovascular: Normal rate, regular rhythm, normal heart sounds and intact distal pulses.   No murmur heard. Pulmonary/Chest: Effort normal. No respiratory distress. She has rhonchi.  Abdominal: Soft. There is no tenderness. There is no rebound and no guarding.  Musculoskeletal: Normal range of motion. She exhibits no edema or tenderness.  Pain with ROM of left knee and palpation of left great toe; ROM of hips intact; weakly palpable distal pulses  Neurological: She is alert.  Moving all extremities normally; right facial droop  Skin: Skin is warm.  Psychiatric: She has a normal mood and affect. Her behavior is normal.  Nursing note and vitals reviewed.   ED Course  Procedures (including critical care time) DIAGNOSTIC STUDIES: Oxygen Saturation is 94% on RA, adequate by my interpretation.    COORDINATION OF CARE: 12:48 PM-labs, CT and x-ray  Labs Review Labs Reviewed  MRSA PCR SCREENING - Abnormal; Notable for the following:    MRSA by PCR  POSITIVE (*)    All other components within normal limits  CBC WITH DIFFERENTIAL/PLATELET - Abnormal; Notable for the following:    Hemoglobin 10.6 (*)    HCT 32.9 (*)    Platelets 403 (*)    All other components within normal limits  COMPREHENSIVE METABOLIC PANEL - Abnormal; Notable for the following:    BUN 35 (*)    Creatinine, Ser 1.43 (*)    Albumin 2.7 (*)    ALT 12 (*)    Total Bilirubin <0.1 (*)    GFR calc non Af Amer 31 (*)    GFR calc Af Amer 36 (*)    All other components within normal limits  URINALYSIS, ROUTINE W REFLEX MICROSCOPIC (NOT AT [facility]) - Abnormal; Notable for the following:  Leukocytes, UA TRACE (*)    All other components within normal limits  URINE MICROSCOPIC-ADD ON - Abnormal; Notable for the following:    Squamous Epithelial / LPF 6-30 (*)    Bacteria, UA RARE (*)    All other components within normal limits  LIPID PANEL - Abnormal; Notable for the following:    Triglycerides 173 (*)    HDL 24 (*)    All other components within normal limits  TROPONIN I    Imaging Review Dg Chest 2 View  07/12/2015  CLINICAL DATA:  Cough.  Fell at nursing home. EXAM: CHEST  2 VIEW COMPARISON:  09/22/2014. FINDINGS: The cardiac silhouette, mediastinal and hilar contours are within normal limits and stable. There is tortuosity and calcification of the thoracic aorta. The lungs are clear. No pleural effusion. The bony thorax is intact. Stable T12 compression deformity. IMPRESSION: No acute cardiopulmonary findings. Electronically Signed   By: Rudie Meyer M.D.   On: 07/12/2015 14:07   Ct Head Wo Contrast  07/12/2015  CLINICAL DATA:  Pt not eating well over the last 2 weeks; Pt has fallen several times recently and had an xray 2 weeks ago of left leg but was "inconclusive." EXAM: CT HEAD WITHOUT CONTRAST TECHNIQUE: Contiguous axial images were obtained from the base of the skull through the vertex without intravenous contrast. COMPARISON:  CT 04/07/2013 FINDINGS: No  acute intracranial hemorrhage. No focal mass lesion. No CT evidence of acute infarction. No midline shift or mass effect. No hydrocephalus. Basilar cisterns are patent. There are periventricular and subcortical white matter hypodensities. Generalized cortical atrophy. Dense calcification in the LEFT parietal lobe associated with the dura is likely a benign meningioma and not changed from prior. Low-density lesions in the LEFT cerebellum consistent with remote infarctions. IMPRESSION: 1. Low-density lesions in the LEFT cerebellum are new from prior but have the appearance of remote infarctions. 2. No acute intracranial findings. 3. Atrophy and white matter microvascular disease. Electronically Signed   By: Genevive Bi M.D.   On: 07/12/2015 13:49   Mr Angiogram Head Wo Contrast  07/12/2015  CLINICAL DATA:  Altered mental status.  Dementia.  Acute infarct. EXAM: MRA HEAD WITHOUT CONTRAST TECHNIQUE: Angiographic images of the Circle of Willis were obtained using MRA technique without intravenous contrast. COMPARISON:  MRI brain from the same day. FINDINGS: The study is severely degraded by patient motion. The most superior slab is not diagnostic due to the motion. The internal carotid arteries are intact bilaterally through the ICA termini without significant focal stenosis. The vessels are poorly visualized beyond the proximal A1 or M1 segments. MCA and ACA branch vessels are not visualized. The vertebral arteries appear codominant. The basilar artery is intact. The proximal PCAs are not well seen. IMPRESSION: 1. The proximal internal carotid arteries and vertebrobasilar system demonstrate no significant focal stenosis. 2. The more distal vessels cannot be assessed due to extreme patient motion. Electronically Signed   By: Marin Roberts M.D.   On: 07/12/2015 17:10   Mr Brain Wo Contrast  07/12/2015  CLINICAL DATA:  Difficulty walking.  Abnormal CT. EXAM: MRI HEAD WITHOUT CONTRAST TECHNIQUE:  Multiplanar, multiecho pulse sequences of the brain and surrounding structures were obtained without intravenous contrast. COMPARISON:  CT head without contrast from the same day. MRI brain 08/27/2009. FINDINGS: Acute nonhemorrhagic infarct is present within the medial superior left cerebellum. The more lateral and inferior left cerebellar infarcts are remote. These were not present in 2011. An additional right remote cerebellar lacunar  infarct is present as well. Progressive diffuse atrophy and white matter disease is present. There multiple remote lacunar infarcts of the basal ganglia bilaterally. Most of these were present on the prior study. White matter changes extend into the brainstem with some progression since the prior exam. Flow is present in the major intracranial arteries. Bilateral lens replacements are noted. The globes and orbits are otherwise intact. Mild mucosal thickening is present in the anterior paranasal sinuses. Fluid levels are present in the sphenoid sinuses bilaterally. The mastoid air cells are clear. The skullbase is within normal limits. Midline sagittal images demonstrate atrophy but no focal lesion. IMPRESSION: 1. Acute 9 mm infarct in the posterior superior left cerebellum without associated hemorrhage. 2. Remote lacunar infarcts of the left cerebellum are otherwise remote. These are new since 2011 and likely new since the previous CT of 2014. 3. Progressive diffuse atrophy and white matter disease. 4. Multiple bilateral remote lacunar infarcts of the basal ganglia. Electronically Signed   By: Marin Roberts M.D.   On: 07/12/2015 17:08   Dg Knee Complete 4 Views Left  07/12/2015  CLINICAL DATA:  Pain following fall EXAM: LEFT KNEE - COMPLETE 4+ VIEW COMPARISON:  April 02, 2011 FINDINGS: Frontal, lateral, and bilateral oblique views were obtained. There is no fracture or dislocation. There is a small joint effusion. There is mild generalized joint space narrowing. No  erosive change. There is extensive chondrocalcinosis. IMPRESSION: Small joint effusion. Mild generalized osteoarthritis. No fracture or dislocation. Extensive chondrocalcinosis. Chondrocalcinosis may be seen with osteoarthritis but also may be seen with calcium pyrophosphate deposition disease which may present clinically as pseudogout. Electronically Signed   By: Bretta Bang III M.D.   On: 07/12/2015 14:04   Dg Foot Complete Left  07/12/2015  CLINICAL DATA:  Left foot pain after fall. EXAM: LEFT FOOT - COMPLETE 3+ VIEW COMPARISON:  None. FINDINGS: There is no evidence of fracture or dislocation. Mild hallux valgus deformity of the first metatarsophalangeal joint is noted. Other joint spaces appear intact. Soft tissues are unremarkable. IMPRESSION: Mild hallux valgus deformity of the first metatarsophalangeal joint. No fracture or dislocation is noted. Electronically Signed   By: Lupita Raider, M.D.   On: 07/12/2015 14:08   Dg Hip Unilat With Pelvis 2-3 Views Left  07/12/2015  CLINICAL DATA:  Initial encounter for complaining of a fall onset PTA. cough, and fallen several times-----previous x-ray performed of the left leg with "inconclusive results." She complains of left toes and left knee pain; she said she fell a year ago and has been having pain ever since. EXAM: DG HIP (WITH OR WITHOUT PELVIS) 2-3V LEFT COMPARISON:  None. FINDINGS: AP view of the pelvis and AP/frog leg views of the left hip. AP view of the pelvis is degraded secondary to overlying soft tissues, obscuring the iliac crests. Mild osteopenia. Vascular calcifications. No acute fracture. Joint spaces are maintained for age. IMPRESSION: No acute osseous abnormality. Electronically Signed   By: Jeronimo Greaves M.D.   On: 07/12/2015 14:05   I have personally reviewed and evaluated these images and lab results as part of my medical decision-making.   EKG Interpretation   Date/Time:  Monday July 12 2015 12:55:54 EST Ventricular  Rate:  89 PR Interval:    QRS Duration: 91 QT Interval:  377 QTC Calculation: 459 R Axis:   48 Text Interpretation:  Atrial fibrillation Low voltage, precordial leads  Borderline prolonged QT interval No significant change was found Baseline  wander Confirmed by [facility]  MD, Jeannett Senior [phone]) on 07/12/2015 12:58:29 PM      MDM   Final diagnoses:  Cerebellar infarct (HCC)   patient from nursing home with fatigue, decreased by mouth intake and recurrent falls. History of dementia. Complains of pain to left knee and left foot.  Labs appear to be at baseline. Initial hypotension has improved. X-rays are negative for traumatic fracture or dislocation. R facial droop on exam, unknown chronicity.  CT with L cerebellar infarct, old appearing.  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  They have been concerned because patient has been staying in bed and not wanting to get up or do anything.  UA not impressive for infection. MR obtained given abnormal CT head and generalized weakness. Acute cerebellar infarct seen. Patient unable to stand or ambulate.  Admission d/w Dr. Arlean Hopping. I personally performed the services described in this documentation, which was scribed in my presence. The recorded information has been reviewed and is accurate.    Glynn Octave, MD 07/13/15 [phone]

## 2015-07-13 ENCOUNTER — Inpatient Hospital Stay (HOSPITAL_COMMUNITY): Payer: Medicare Other

## 2015-07-13 DIAGNOSIS — I6789 Other cerebrovascular disease: Secondary | ICD-10-CM

## 2015-07-13 LAB — LIPID PANEL
Cholesterol: 133 mg/dL (ref 0–200)
HDL: 24 mg/dL — ABNORMAL LOW (ref >40)
LDL CALC: 74 mg/dL (ref 0–99)
Total CHOL/HDL Ratio: 5.5 RATIO
Triglycerides: 173 mg/dL — ABNORMAL HIGH (ref <150)
VLDL: 35 mg/dL (ref 0–40)

## 2015-07-13 LAB — MRSA PCR SCREENING: MRSA by PCR: POSITIVE — AB

## 2015-07-13 MED ORDER — ASPIRIN 325 MG PO TABS
325.0000 mg | ORAL_TABLET | Freq: Every day | ORAL | Status: DC
  Administered 2015-07-14: 325 mg via ORAL
  Filled 2015-07-13: qty 1

## 2015-07-13 MED ORDER — CETYLPYRIDINIUM CHLORIDE 0.05 % MT LIQD
7.0000 mL | Freq: Two times a day (BID) | OROMUCOSAL | Status: DC
  Administered 2015-07-13: 7 mL via OROMUCOSAL

## 2015-07-13 NOTE — Care Management Note (Signed)
Case Management Note  Patient Details  Name: Sheri Matthews MRN: [id number] Date of Birth: 1924/04/06  Subjective/Objective:       From [facility] assisted living.  Has had [facility] in the past. More to follow.             Action/Plan:  ALF/ SNF Expected Discharge Date:                  Expected Discharge Plan:  Assisted Living / Rest Home  In-House Referral:  Clinical Social Work  Discharge planning Services  CM Consult  Post Acute Care Choice:    Choice offered to:     DME Arranged:    DME Agency:     HH Arranged:    HH Agency:  [facility]  Status of Service:  In process, will continue to follow  Medicare Important Message Given:    Date Medicare IM Given:    Medicare IM give by:    Date Additional Medicare IM Given:    Additional Medicare Important Message give by:     If discussed at Long Length of Stay Meetings, dates discussed:    Additional Comments:  Adonis Huguenin, RN 07/13/2015, 5:27 PM

## 2015-07-13 NOTE — Progress Notes (Signed)
Subjective: She was admitted with failure to thrive and possibly acute stroke. She has dementia at baseline. She had acute renal failure. She has chronic atrial fib. She's not been doing well for about 2 weeks. She is still very confused.  Objective: Vital signs in last 24 hours: Temp:  [98.1 F (36.7 C)-99 F (37.2 C)] 98.7 F (37.1 C) (02/28 0728) Pulse Rate:  [60-104] 69 (02/28 0143) Resp:  [13-25] 20 (02/28 0530) BP: (90-152)/(27-105) 130/74 mmHg (02/28 0728) SpO2:  [94 %-100 %] 96 % (02/28 0728) Weight:  [54.432 kg (120 lb)-57 kg (125 lb 10.6 oz)] 57 kg (125 lb 10.6 oz) (02/27 1943) Weight change:  Last BM Date: 07/13/15  Intake/Output from previous day:    PHYSICAL EXAM General appearance: alert and Confused and with a left facial droop Resp: clear to auscultation bilaterally Cardio: irregularly irregular rhythm GI: soft, non-tender; bowel sounds normal; no masses,  no organomegaly Extremities: extremities normal, atraumatic, no cyanosis or edema  Lab Results:  Results for orders placed or performed during the hospital encounter of 07/12/15 (from the past 48 hour(s))  CBC with Differential/Platelet     Status: Abnormal   Collection Time: 07/12/15  1:02 PM  Result Value Ref Range   WBC 5.6 4.0 - 10.5 K/uL   RBC 3.88 3.87 - 5.11 MIL/uL   Hemoglobin 10.6 (L) 12.0 - 15.0 g/dL   HCT 32.9 (L) 36.0 - 46.0 %   MCV 84.8 78.0 - 100.0 fL   MCH 27.3 26.0 - 34.0 pg   MCHC 32.2 30.0 - 36.0 g/dL   RDW 12.6 11.5 - 15.5 %   Platelets 403 (H) 150 - 400 K/uL   Neutrophils Relative % 57 %   Neutro Abs 3.2 1.7 - 7.7 K/uL   Lymphocytes Relative 27 %   Lymphs Abs 1.5 0.7 - 4.0 K/uL   Monocytes Relative 15 %   Monocytes Absolute 0.9 0.1 - 1.0 K/uL   Eosinophils Relative 1 %   Eosinophils Absolute 0.1 0.0 - 0.7 K/uL   Basophils Relative 0 %   Basophils Absolute 0.0 0.0 - 0.1 K/uL  Comprehensive metabolic panel     Status: Abnormal   Collection Time: 07/12/15  1:02 PM  Result Value  Ref Range   Sodium 138 135 - 145 mmol/L   Potassium 3.7 3.5 - 5.1 mmol/L   Chloride 103 101 - 111 mmol/L   CO2 22 22 - 32 mmol/L   Glucose, Bld 93 65 - 99 mg/dL   BUN 35 (H) 6 - 20 mg/dL   Creatinine, Ser 1.43 (H) 0.44 - 1.00 mg/dL   Calcium 9.7 8.9 - 10.3 mg/dL   Total Protein 6.6 6.5 - 8.1 g/dL   Albumin 2.7 (L) 3.5 - 5.0 g/dL   AST 21 15 - 41 U/L   ALT 12 (L) 14 - 54 U/L   Alkaline Phosphatase 71 38 - 126 U/L   Total Bilirubin <0.1 (L) 0.3 - 1.2 mg/dL   GFR calc non Af Amer 31 (L) >60 mL/min   GFR calc Af Amer 36 (L) >60 mL/min    Comment: (NOTE) The eGFR has been calculated using the CKD EPI equation. This calculation has not been validated in all clinical situations. eGFR's persistently <60 mL/min signify possible Chronic Kidney Disease.    Anion gap 13 5 - 15  Troponin I     Status: None   Collection Time: 07/12/15  1:02 PM  Result Value Ref Range   Troponin I <0.03 <0.031  ng/mL    Comment:        NO INDICATION OF MYOCARDIAL INJURY.   Urinalysis, Routine w reflex microscopic (not at [facility])     Status: Abnormal   Collection Time: 07/12/15  2:30 PM  Result Value Ref Range   Color, Urine YELLOW YELLOW   APPearance CLEAR CLEAR   Specific Gravity, Urine 1.020 1.005 - 1.030   pH 5.0 5.0 - 8.0   Glucose, UA NEGATIVE NEGATIVE mg/dL   Hgb urine dipstick NEGATIVE NEGATIVE   Bilirubin Urine NEGATIVE NEGATIVE   Ketones, ur NEGATIVE NEGATIVE mg/dL   Protein, ur NEGATIVE NEGATIVE mg/dL   Nitrite NEGATIVE NEGATIVE   Leukocytes, UA TRACE (A) NEGATIVE  Urine microscopic-add on     Status: Abnormal   Collection Time: 07/12/15  2:30 PM  Result Value Ref Range   Squamous Epithelial / LPF 6-30 (A) NONE SEEN   WBC, UA 0-5 0 - 5 WBC/hpf   RBC / HPF NONE SEEN 0 - 5 RBC/hpf   Bacteria, UA RARE (A) NONE SEEN  MRSA PCR Screening     Status: Abnormal   Collection Time: 07/12/15 11:10 PM  Result Value Ref Range   MRSA by PCR POSITIVE (A) NEGATIVE    Comment:        The GeneXpert  MRSA Assay (FDA approved for NASAL specimens only), is one component of a comprehensive MRSA colonization surveillance program. It is not intended to diagnose MRSA infection nor to guide or monitor treatment for MRSA infections. RESULT CALLED TO, READ BACK BY AND VERIFIED WITH: WILSON A AT 0227 ON [id number] BY FORSYTH K   Lipid panel     Status: Abnormal   Collection Time: 07/13/15  5:58 AM  Result Value Ref Range   Cholesterol 133 0 - 200 mg/dL   Triglycerides 173 (H) <150 mg/dL   HDL 24 (L) >40 mg/dL   Total CHOL/HDL Ratio 5.5 RATIO   VLDL 35 0 - 40 mg/dL   LDL Cholesterol 74 0 - 99 mg/dL    Comment:        Total Cholesterol/HDL:CHD Risk Coronary Heart Disease Risk Table                     Men   Women  1/2 Average Risk   3.4   3.3  Average Risk       5.0   4.4  2 X Average Risk   9.6   7.1  3 X Average Risk  23.4   11.0        Use the calculated Patient Ratio above and the CHD Risk Table to determine the patient's CHD Risk.        ATP III CLASSIFICATION (LDL):  <100     mg/dL   Optimal  100-129  mg/dL   Near or Above                    Optimal  130-159  mg/dL   Borderline  160-189  mg/dL   High  >190     mg/dL   Very High     ABGS No results for input(s): PHART, PO2ART, TCO2, HCO3 in the last 72 hours.  Invalid input(s): PCO2 CULTURES Recent Results (from the past 240 hour(s))  MRSA PCR Screening     Status: Abnormal   Collection Time: 07/12/15 11:10 PM  Result Value Ref Range Status   MRSA by PCR POSITIVE (A) NEGATIVE Final    Comment:  The GeneXpert MRSA Assay (FDA approved for NASAL specimens only), is one component of a comprehensive MRSA colonization surveillance program. It is not intended to diagnose MRSA infection nor to guide or monitor treatment for MRSA infections. RESULT CALLED TO, READ BACK BY AND VERIFIED WITH: WILSON A AT 0227 ON [id number] BY FORSYTH K    Studies/Results: Dg Chest 2 View  07/12/2015  CLINICAL DATA:  Cough.  Fell  at nursing home. EXAM: CHEST  2 VIEW COMPARISON:  09/22/2014. FINDINGS: The cardiac silhouette, mediastinal and hilar contours are within normal limits and stable. There is tortuosity and calcification of the thoracic aorta. The lungs are clear. No pleural effusion. The bony thorax is intact. Stable T12 compression deformity. IMPRESSION: No acute cardiopulmonary findings. Electronically Signed   By: Marijo Sanes M.D.   On: 07/12/2015 14:07   Ct Head Wo Contrast  07/12/2015  CLINICAL DATA:  Pt not eating well over the last 2 weeks; Pt has fallen several times recently and had an xray 2 weeks ago of left leg but was "inconclusive." EXAM: CT HEAD WITHOUT CONTRAST TECHNIQUE: Contiguous axial images were obtained from the base of the skull through the vertex without intravenous contrast. COMPARISON:  CT 04/07/2013 FINDINGS: No acute intracranial hemorrhage. No focal mass lesion. No CT evidence of acute infarction. No midline shift or mass effect. No hydrocephalus. Basilar cisterns are patent. There are periventricular and subcortical white matter hypodensities. Generalized cortical atrophy. Dense calcification in the LEFT parietal lobe associated with the dura is likely a benign meningioma and not changed from prior. Low-density lesions in the LEFT cerebellum consistent with remote infarctions. IMPRESSION: 1. Low-density lesions in the LEFT cerebellum are new from prior but have the appearance of remote infarctions. 2. No acute intracranial findings. 3. Atrophy and white matter microvascular disease. Electronically Signed   By: Suzy Bouchard M.D.   On: 07/12/2015 13:49   Mr Angiogram Head Wo Contrast  07/12/2015  CLINICAL DATA:  Altered mental status.  Dementia.  Acute infarct. EXAM: MRA HEAD WITHOUT CONTRAST TECHNIQUE: Angiographic images of the Circle of Willis were obtained using MRA technique without intravenous contrast. COMPARISON:  MRI brain from the same day. FINDINGS: The study is severely degraded by  patient motion. The most superior slab is not diagnostic due to the motion. The internal carotid arteries are intact bilaterally through the ICA termini without significant focal stenosis. The vessels are poorly visualized beyond the proximal A1 or M1 segments. MCA and ACA branch vessels are not visualized. The vertebral arteries appear codominant. The basilar artery is intact. The proximal PCAs are not well seen. IMPRESSION: 1. The proximal internal carotid arteries and vertebrobasilar system demonstrate no significant focal stenosis. 2. The more distal vessels cannot be assessed due to extreme patient motion. Electronically Signed   By: San Morelle M.D.   On: 07/12/2015 17:10   Mr Brain Wo Contrast  07/12/2015  CLINICAL DATA:  Difficulty walking.  Abnormal CT. EXAM: MRI HEAD WITHOUT CONTRAST TECHNIQUE: Multiplanar, multiecho pulse sequences of the brain and surrounding structures were obtained without intravenous contrast. COMPARISON:  CT head without contrast from the same day. MRI brain 08/27/2009. FINDINGS: Acute nonhemorrhagic infarct is present within the medial superior left cerebellum. The more lateral and inferior left cerebellar infarcts are remote. These were not present in 2011. An additional right remote cerebellar lacunar infarct is present as well. Progressive diffuse atrophy and white matter disease is present. There multiple remote lacunar infarcts of the basal ganglia bilaterally. Most of these  were present on the prior study. White matter changes extend into the brainstem with some progression since the prior exam. Flow is present in the major intracranial arteries. Bilateral lens replacements are noted. The globes and orbits are otherwise intact. Mild mucosal thickening is present in the anterior paranasal sinuses. Fluid levels are present in the sphenoid sinuses bilaterally. The mastoid air cells are clear. The skullbase is within normal limits. Midline sagittal images demonstrate  atrophy but no focal lesion. IMPRESSION: 1. Acute 9 mm infarct in the posterior superior left cerebellum without associated hemorrhage. 2. Remote lacunar infarcts of the left cerebellum are otherwise remote. These are new since 2011 and likely new since the previous CT of 2014. 3. Progressive diffuse atrophy and white matter disease. 4. Multiple bilateral remote lacunar infarcts of the basal ganglia. Electronically Signed   By: San Morelle M.D.   On: 07/12/2015 17:08   Dg Knee Complete 4 Views Left  07/12/2015  CLINICAL DATA:  Pain following fall EXAM: LEFT KNEE - COMPLETE 4+ VIEW COMPARISON:  April 02, 2011 FINDINGS: Frontal, lateral, and bilateral oblique views were obtained. There is no fracture or dislocation. There is a small joint effusion. There is mild generalized joint space narrowing. No erosive change. There is extensive chondrocalcinosis. IMPRESSION: Small joint effusion. Mild generalized osteoarthritis. No fracture or dislocation. Extensive chondrocalcinosis. Chondrocalcinosis may be seen with osteoarthritis but also may be seen with calcium pyrophosphate deposition disease which may present clinically as pseudogout. Electronically Signed   By: Lowella Grip III M.D.   On: 07/12/2015 14:04   Dg Foot Complete Left  07/12/2015  CLINICAL DATA:  Left foot pain after fall. EXAM: LEFT FOOT - COMPLETE 3+ VIEW COMPARISON:  None. FINDINGS: There is no evidence of fracture or dislocation. Mild hallux valgus deformity of the first metatarsophalangeal joint is noted. Other joint spaces appear intact. Soft tissues are unremarkable. IMPRESSION: Mild hallux valgus deformity of the first metatarsophalangeal joint. No fracture or dislocation is noted. Electronically Signed   By: Marijo Conception, M.D.   On: 07/12/2015 14:08   Dg Hip Unilat With Pelvis 2-3 Views Left  07/12/2015  CLINICAL DATA:  Initial encounter for complaining of a fall onset PTA. cough, and fallen several times-----previous  x-ray performed of the left leg with "inconclusive results." She complains of left toes and left knee pain; she said she fell a year ago and has been having pain ever since. EXAM: DG HIP (WITH OR WITHOUT PELVIS) 2-3V LEFT COMPARISON:  None. FINDINGS: AP view of the pelvis and AP/frog leg views of the left hip. AP view of the pelvis is degraded secondary to overlying soft tissues, obscuring the iliac crests. Mild osteopenia. Vascular calcifications. No acute fracture. Joint spaces are maintained for age. IMPRESSION: No acute osseous abnormality. Electronically Signed   By: Abigail Miyamoto M.D.   On: 07/12/2015 14:05    Medications:  Prior to Admission:  Prescriptions prior to admission  Medication Sig Dispense Refill Last Dose  . acetaminophen (TYLENOL) 500 MG tablet Take 500 mg by mouth every 6 (six) hours as needed for mild pain or moderate pain.   Past Week at Unknown time  . aspirin EC 81 MG tablet Take 81 mg by mouth daily.   07/12/2015 at Unknown time  . busPIRone (BUSPAR) 5 MG tablet Take 2.5 mg by mouth 2 (two) times daily.    07/12/2015 at Unknown time  . calcium-vitamin D (OSCAL WITH D) 500-200 MG-UNIT per tablet Take 1 tablet by mouth  2 (two) times daily.   07/12/2015 at Unknown time  . cholecalciferol (VITAMIN D) 1000 UNITS tablet Take 1,000 Units by mouth daily.   07/12/2015 at Unknown time  . citalopram (CELEXA) 20 MG tablet Take 20 mg by mouth daily.   07/12/2015 at Unknown time  . diltiazem (CARDIZEM CD) 120 MG 24 hr capsule Take 1 capsule (120 mg total) by mouth daily.   07/12/2015 at Unknown time  . donepezil (ARICEPT) 10 MG tablet Take 10 mg by mouth at bedtime.   07/11/2015 at Unknown time  . fluticasone (FLONASE) 50 MCG/ACT nasal spray Place 2 sprays into both nostrils daily.   07/12/2015 at Unknown time  . latanoprost (XALATAN) 0.005 % ophthalmic solution Place 1 drop into the right eye at bedtime.   07/11/2015 at Unknown time  . loratadine (CLARITIN) 10 MG tablet Take 10 mg by mouth daily.    07/12/2015  . Multiple Vitamin (MULTIVITAMIN WITH MINERALS) TABS tablet Take 1 tablet by mouth daily.   07/12/2015 at Unknown time  . Omega 3 1000 MG CAPS Take 1 capsule by mouth 2 (two) times daily.   07/12/2015 at Unknown time  . omeprazole (PRILOSEC) 20 MG capsule Take 20 mg by mouth daily.   07/12/2015 at Unknown time  . vitamin B-12 (CYANOCOBALAMIN) 1000 MCG tablet Take 1,000 mcg by mouth daily.   07/12/2015 at Unknown time  . ALPRAZolam (XANAX) 0.25 MG tablet Take 1 tablet (0.25 mg total) by mouth 3 (three) times daily as needed for anxiety (agitation). (Patient not taking: Reported on 09/22/2014) 30 tablet 0   . metoprolol (LOPRESSOR) 12.5 mg TABS tablet Take 2 tablets (50 mg total) by mouth 2 (two) times daily. (Patient not taking: Reported on 09/22/2014)      Scheduled: .  stroke: mapping our early stages of recovery book   Does not apply Once  . aspirin EC  81 mg Oral Daily  . busPIRone  2.5 mg Oral BID  . calcium-vitamin D  1 tablet Oral BID  . cholecalciferol  1,000 Units Oral Daily  . citalopram  20 mg Oral Daily  . diltiazem  120 mg Oral Daily  . donepezil  10 mg Oral QHS  . haloperidol lactate  1 mg Intravenous Once  . latanoprost  1 drop Right Eye QHS  . [START ON 07/14/2015] levofloxacin  500 mg Oral Q48H  . metoprolol  50 mg Oral BID  . multivitamin with minerals  1 tablet Oral Daily  . omega-3 acid ethyl esters  1 capsule Oral BID  . pantoprazole  40 mg Oral Daily  . vitamin B-12  1,000 mcg Oral Daily   Continuous: . sodium chloride 75 mL/hr at 07/12/15 2346   PPI:[id number], senna-docusate  Assesment: She came in with failure to thrive. She has chronic atrial fibrillation and has had a cerebellar infarct which may be acute. PT consultation has been requested. I will have neurology see her. Continue IV fluids etc. Active Problems:   Failure to thrive in adult   Atrial fibrillation (HCC)   Dehydration   Cerebellar infarct (HCC)   Gait abnormality   General  weakness   Acute bronchitis   Falls   CVA (cerebral infarction)    Plan: Neurology consultation PT consultation    LOS: 1 day   Gildardo Tickner L 07/13/2015, 9:02 AM

## 2015-07-13 NOTE — Consult Note (Signed)
[facility]  A. , MD     [email or web address]          Sheri Matthews is an 82 y.o. female.   ASSESSMENT/PLAN: 1. Acute/subacute gait impairment likely multifactorial including acute stroke. However, she also has an effect of osteoarthritis and aging. Medication effect is also a potential issue. 2. Chronic atrial fibrillation. 3. Underlying dementia.  RECOMMENDATION: Increase aspirin to 325. Physical and occupational therapy. Minimize psychotropic medications. Continue with Aricept. Dementia labs  This is an 82-year-old white female who has a baseline history of gait impairment over last few years and dementia. She currently lives in a nursing home facility. It appears that she's had several falls in the last couple weeks associated with deterioration of gait and basic function. She was taken to the emergency room for further evaluation. She has had several falls and because of this she was taken off long-term anticoagulation for chronic atrial fibrillation. She has been maintained on aspirin 81 mg. Patient cannot provide any history because of her baseline confusion. She thinks she is at home. SHEENT has no complaints however.  GENERAL: Pleasant female in no acute distress.  HEENT: Supple. Atraumatic normocephalic.   ABDOMEN: soft  EXTREMITIES: No edema. Significant arthritic changes of the hands and knees. She does have moderate pain on movement of the ankles with no evidence of swelling.   BACK: Normal.  SKIN: Normal by inspection.    MENTAL STATUS: She is awake and alert. She thinks she is at home. She does follow commands briskly. There is no dysarthria. She is not oriented to time.  CRANIAL NERVES: Pupils are equal, round and reactive to light and accommodation; extra ocular movements are full, there is no significant nystagmus; visual fields are full; upper and lower facial muscles are normal in strength and symmetric, there is no flattening of the  nasolabial folds; tongue is midline; uvula is midline; shoulder elevation is normal.  MOTOR: Moves both sides well. There is no drift with the upper extremities her legs. Exact strength is difficult because of difficulties following commands. Bulk and tone are normal throughout. She has a least 4/5 strength throughout.  COORDINATION: Left finger to nose is normal, right finger to nose is normal, No rest tremor; no intention tremor; no postural tremor; no bradykinesia.  REFLEXES: Deep tendon reflexes are symmetrical and normal. Babinski reflexes are flexor bilaterally.   SENSATION: Normal to light touch and pain.   Blood pressure 127/88, pulse 67, temperature 98.6 F (37 C), temperature source Oral, resp. rate 20, height 5' 4" (1.626 m), weight 57 kg (125 lb 10.6 oz), SpO2 95 %.  Past Medical History  Diagnosis Date  . Hypertension   . Arthritis   . Shortness of breath   . Stroke (HCC)   . GERD (gastroesophageal reflux disease)   . Dysrhythmia   . Pneumonia   . Depression   . Anemia   . Dementia with behavioral disturbance 04/10/2013  . Acute renal failure (HCC) 04/10/2013  . A-fib (HCC)   . Failure to thrive in adult   . Orthostatic hypotension   . Depression     Past Surgical History  Procedure Laterality Date  . Back surgery    . Ovaries  removed    History reviewed. No pertinent family history.  Social History:  reports that she has never smoked. She has never used smokeless tobacco. She reports that she does not drink alcohol or use illicit drugs.  Allergies: No Known Allergies  Medications: Prior   to Admission medications   Medication Sig Start Date End Date Taking? Authorizing Provider  acetaminophen (TYLENOL) 500 MG tablet Take 500 mg by mouth every 6 (six) hours as needed for mild pain or moderate pain.   Yes Historical Provider, MD  aspirin EC 81 MG tablet Take 81 mg by mouth daily.   Yes Historical Provider, MD  busPIRone (BUSPAR) 5 MG tablet Take 2.5 mg by  mouth 2 (two) times daily.    Yes Historical Provider, MD  calcium-vitamin D (OSCAL WITH D) 500-200 MG-UNIT per tablet Take 1 tablet by mouth 2 (two) times daily.   Yes Historical Provider, MD  cholecalciferol (VITAMIN D) 1000 UNITS tablet Take 1,000 Units by mouth daily.   Yes Historical Provider, MD  citalopram (CELEXA) 20 MG tablet Take 20 mg by mouth daily. 03/04/13  Yes Historical Provider, MD  diltiazem (CARDIZEM CD) 120 MG 24 hr capsule Take 1 capsule (120 mg total) by mouth daily. 04/10/13  Yes Edward Hawkins, MD  donepezil (ARICEPT) 10 MG tablet Take 10 mg by mouth at bedtime.   Yes Historical Provider, MD  fluticasone (FLONASE) 50 MCG/ACT nasal spray Place 2 sprays into both nostrils daily.   Yes Historical Provider, MD  latanoprost (XALATAN) 0.005 % ophthalmic solution Place 1 drop into the right eye at bedtime.   Yes Historical Provider, MD  loratadine (CLARITIN) 10 MG tablet Take 10 mg by mouth daily.   Yes Historical Provider, MD  Multiple Vitamin (MULTIVITAMIN WITH MINERALS) TABS tablet Take 1 tablet by mouth daily.   Yes Historical Provider, MD  Omega 3 1000 MG CAPS Take 1 capsule by mouth 2 (two) times daily.   Yes Historical Provider, MD  omeprazole (PRILOSEC) 20 MG capsule Take 20 mg by mouth daily.   Yes Historical Provider, MD  vitamin B-12 (CYANOCOBALAMIN) 1000 MCG tablet Take 1,000 mcg by mouth daily.   Yes Historical Provider, MD  ALPRAZolam (XANAX) 0.25 MG tablet Take 1 tablet (0.25 mg total) by mouth 3 (three) times daily as needed for anxiety (agitation). Patient not taking: Reported on 09/22/2014 04/10/13   Edward Hawkins, MD  metoprolol (LOPRESSOR) 12.5 mg TABS tablet Take 2 tablets (50 mg total) by mouth 2 (two) times daily. Patient not taking: Reported on 09/22/2014 04/10/13   Edward Hawkins, MD    Scheduled Meds: .  stroke: mapping our early stages of recovery book   Does not apply Once  . antiseptic oral rinse  7 mL Mouth Rinse BID  . aspirin EC  81 mg Oral Daily    . busPIRone  2.5 mg Oral BID  . calcium-vitamin D  1 tablet Oral BID  . cholecalciferol  1,000 Units Oral Daily  . citalopram  20 mg Oral Daily  . diltiazem  120 mg Oral Daily  . donepezil  10 mg Oral QHS  . haloperidol lactate  1 mg Intravenous Once  . latanoprost  1 drop Right Eye QHS  . [START ON 07/14/2015] levofloxacin  500 mg Oral Q48H  . metoprolol  50 mg Oral BID  . multivitamin with minerals  1 tablet Oral Daily  . omega-3 acid ethyl esters  1 capsule Oral BID  . pantoprazole  40 mg Oral Daily  . vitamin B-12  1,000 mcg Oral Daily   Continuous Infusions: . sodium chloride 1 mL (07/13/15 1324)   PRN Meds:.acetaminophen, senna-docusate     Results for orders placed or performed during the hospital encounter of 07/12/15 (from the past 48 hour(s))  CBC with   Differential/Platelet     Status: Abnormal   Collection Time: 07/12/15  1:02 PM  Result Value Ref Range   WBC 5.6 4.0 - 10.5 K/uL   RBC 3.88 3.87 - 5.11 MIL/uL   Hemoglobin 10.6 (L) 12.0 - 15.0 g/dL   HCT 32.9 (L) 36.0 - 46.0 %   MCV 84.8 78.0 - 100.0 fL   MCH 27.3 26.0 - 34.0 pg   MCHC 32.2 30.0 - 36.0 g/dL   RDW 12.6 11.5 - 15.5 %   Platelets 403 (H) 150 - 400 K/uL   Neutrophils Relative % 57 %   Neutro Abs 3.2 1.7 - 7.7 K/uL   Lymphocytes Relative 27 %   Lymphs Abs 1.5 0.7 - 4.0 K/uL   Monocytes Relative 15 %   Monocytes Absolute 0.9 0.1 - 1.0 K/uL   Eosinophils Relative 1 %   Eosinophils Absolute 0.1 0.0 - 0.7 K/uL   Basophils Relative 0 %   Basophils Absolute 0.0 0.0 - 0.1 K/uL  Comprehensive metabolic panel     Status: Abnormal   Collection Time: 07/12/15  1:02 PM  Result Value Ref Range   Sodium 138 135 - 145 mmol/L   Potassium 3.7 3.5 - 5.1 mmol/L   Chloride 103 101 - 111 mmol/L   CO2 22 22 - 32 mmol/L   Glucose, Bld 93 65 - 99 mg/dL   BUN 35 (H) 6 - 20 mg/dL   Creatinine, Ser 1.43 (H) 0.44 - 1.00 mg/dL   Calcium 9.7 8.9 - 10.3 mg/dL   Total Protein 6.6 6.5 - 8.1 g/dL   Albumin 2.7 (L) 3.5 -  5.0 g/dL   AST 21 15 - 41 U/L   ALT 12 (L) 14 - 54 U/L   Alkaline Phosphatase 71 38 - 126 U/L   Total Bilirubin <0.1 (L) 0.3 - 1.2 mg/dL   GFR calc non Af Amer 31 (L) >60 mL/min   GFR calc Af Amer 36 (L) >60 mL/min    Comment: (NOTE) The eGFR has been calculated using the CKD EPI equation. This calculation has not been validated in all clinical situations. eGFR's persistently <60 mL/min signify possible Chronic Kidney Disease.    Anion gap 13 5 - 15  Troponin I     Status: None   Collection Time: 07/12/15  1:02 PM  Result Value Ref Range   Troponin I <0.03 <0.031 ng/mL    Comment:        NO INDICATION OF MYOCARDIAL INJURY.   Urinalysis, Routine w reflex microscopic (not at [facility])     Status: Abnormal   Collection Time: 07/12/15  2:30 PM  Result Value Ref Range   Color, Urine YELLOW YELLOW   APPearance CLEAR CLEAR   Specific Gravity, Urine 1.020 1.005 - 1.030   pH 5.0 5.0 - 8.0   Glucose, UA NEGATIVE NEGATIVE mg/dL   Hgb urine dipstick NEGATIVE NEGATIVE   Bilirubin Urine NEGATIVE NEGATIVE   Ketones, ur NEGATIVE NEGATIVE mg/dL   Protein, ur NEGATIVE NEGATIVE mg/dL   Nitrite NEGATIVE NEGATIVE   Leukocytes, UA TRACE (A) NEGATIVE  Urine microscopic-add on     Status: Abnormal   Collection Time: 07/12/15  2:30 PM  Result Value Ref Range   Squamous Epithelial / LPF 6-30 (A) NONE SEEN   WBC, UA 0-5 0 - 5 WBC/hpf   RBC / HPF NONE SEEN 0 - 5 RBC/hpf   Bacteria, UA RARE (A) NONE SEEN  MRSA PCR Screening     Status: Abnormal     Collection Time: 07/12/15 11:10 PM  Result Value Ref Range   MRSA by PCR POSITIVE (A) NEGATIVE    Comment:        The GeneXpert MRSA Assay (FDA approved for NASAL specimens only), is one component of a comprehensive MRSA colonization surveillance program. It is not intended to diagnose MRSA infection nor to guide or monitor treatment for MRSA infections. RESULT CALLED TO, READ BACK BY AND VERIFIED WITH: WILSON A AT 0227 ON [id number] BY FORSYTH K     Lipid panel     Status: Abnormal   Collection Time: 07/13/15  5:58 AM  Result Value Ref Range   Cholesterol 133 0 - 200 mg/dL   Triglycerides 173 (H) <150 mg/dL   HDL 24 (L) >40 mg/dL   Total CHOL/HDL Ratio 5.5 RATIO   VLDL 35 0 - 40 mg/dL   LDL Cholesterol 74 0 - 99 mg/dL    Comment:        Total Cholesterol/HDL:CHD Risk Coronary Heart Disease Risk Table                     Men   Women  1/2 Average Risk   3.4   3.3  Average Risk       5.0   4.4  2 X Average Risk   9.6   7.1  3 X Average Risk  23.4   11.0        Use the calculated Patient Ratio above and the CHD Risk Table to determine the patient's CHD Risk.        ATP III CLASSIFICATION (LDL):  <100     mg/dL   Optimal  100-129  mg/dL   Near or Above                    Optimal  130-159  mg/dL   Borderline  160-189  mg/dL   High  >190     mg/dL   Very High     Studies/Results:  BRAIN MRI/MRA  1. The proximal internal carotid arteries and vertebrobasilar system demonstrate no significant focal stenosis. 2. The more distal vessels cannot be assessed due to extreme patient motion.  1. Acute 9 mm infarct in the posterior superior left cerebellum without associated hemorrhage. 2. Remote lacunar infarcts of the left cerebellum are otherwise remote. These are new since 2011 and likely new since the previous CT of 2014. 3. Progressive diffuse atrophy and white matter disease. 4. Multiple bilateral remote lacunar infarcts of the basal ganglia.    The brain MRI is reviewed in person. There is a tiny acute infarct involving the left superior cerebellar region. There are chronic infarcts involving the left inferior cerebellar region. There is moderate to severe global atrophy. There is moderate periventricular leukoencephalopathy. There are couple chronic lacunar infarcts involving the centrum semiovale bilaterally.     A. , M.D.  Diplomate, American Board of Psychiatry and Neurology (  Neurology). 07/13/2015, 7:33 PM       

## 2015-07-13 NOTE — Progress Notes (Signed)
*  PRELIMINARY RESULTS* Echocardiogram 2D Echocardiogram has been performed.  Jeryl Columbia 07/13/2015, 4:29 PM

## 2015-07-13 NOTE — Evaluation (Addendum)
Physical Therapy Evaluation Patient Details Name: Sheri Matthews MRN: [id number] DOB: 09-17-1923 Today's Date: 07/13/2015   History of Present Illness  82yo white female resident of [address] [facility] in [place], comes to [facility] after 34m decline in mobility, PO intake, and multiple falls. Pt found to have acute cerebellar infarct, and remote cerebellar/basal ganglia infarcts indeterminate in age, but new since 2014.  PMH: is complex and high density, includes dementia, which as family describes is in early stage. The family offers R sided Bell's palsy as medical history for the patient, although it is not in the medical record.   Clinical Impression  At evaluation, pt is received semirecumbent in bed upon entry, nursing and 2 daughters present. The pt is awake and agreeable to participate. No acute distress noted at this time. The pt is alert, pleasant, conversational, and following simple commands 50-75% of the time. Family reports multiple pt falls in the last month; in session pt demonstrates failure of acquisition of balance in stance, requiring total physical assistance to combat posterior lean. Pt grip strength is strong and symmetrical; global strength as screened during functional mobility assessment presents with moderate impairment, the pt now requiring moderate physical assistance for bed mobility and transfers, whereas the patient was performing these with minimal assistance at baseline . Digital opposition is slow and labored bilaterally, potentially only impaired by cognitive deficits, but unable to identify cerebellar involvement. Dysmetria is present bilaterally, on the R worse distally, and on the L worse proximally. Eye tracking is WFL, no noted vertigo/nystagmus at this time (a complaint offered earlier with OT and chronically listed in medical record.) At this time it is difficult to ascertain as to whether LLE pain or falls anxiety is a greater limiting factor to progressing with standing  balance, however balance in standing is never achieved in this session. Patient presenting with impairment of strength, range of motion, coordination, balance, pain, and activity tolerance, limiting ability to perform ADL and mobility tasks at  baseline level of function. Patient will benefit from skilled intervention to address the above impairments and limitations, in order to restore to prior level of function, improve patient safety upon discharge, and to decrease falls risk.       Follow Up Recommendations Other (comment);Supervision for mobility/OOB;Supervision/Assistance - 24 hour (return to [facility] c PT services there 3x weekly. )    Equipment Recommendations  Other (comment)    Recommendations for Other Services       Precautions / Restrictions Precautions Precautions: None Restrictions Weight Bearing Restrictions: No      Mobility  Bed Mobility Overal bed mobility: Needs Assistance Bed Mobility: Supine to Sit;Sit to Supine     Supine to sit: Min assist;HOB elevated Sit to supine: Mod assist;HOB elevated   General bed mobility comments: Pt requiring assistance for bed mobility due to generalized weakness, can make it most of the way, but reqires assistance for scooting to EOB and assistance for lateral scooting.   Transfers Overall transfer level: Needs assistance Equipment used: 1 person hand held assist Transfers: Sit to/from Stand Sit to Stand: Mod assist         General transfer comment: Performed twice. Pt with significant posterior lean, unable to pull self forward. When PT attempts to assist, pt collapses onto bed, saying she is unable due to L ankle pain. Pt refuses additional attemps.   Ambulation/Gait                Stairs  Wheelchair Mobility    Modified Rankin (Stroke Patients Only)       Balance Overall balance assessment: Needs assistance;History of Falls Sitting-balance support: Single extremity  supported;Feet supported Sitting balance-Leahy Scale: Poor     Standing balance support: Bilateral upper extremity supported;During functional activity Standing balance-Leahy Scale: Zero                               Pertinent Vitals/Pain Pain Assessment: Faces Faces Pain Scale: Hurts little more Pain Location: L ankle; family reports chronic L knee pain for years.  Pain Intervention(s): Limited activity within patient's tolerance;Monitored during session;Repositioned    Home Living Family/patient expects to be discharged to:: Assisted living [facility] [place] )               Home Equipment:  (unclear) Additional Comments: was using WC or RW for ambulation. Limited due to chronic orthostasis issues.     Prior Function Level of Independence: Needs assistance   Gait / Transfers Assistance Needed: Family reports patient has been mostly in bed for past month.            Hand Dominance   Dominant Hand: Right    Extremity/Trunk Assessment   Upper Extremity Assessment: Defer to OT evaluation           Lower Extremity Assessment: Generalized weakness (Noted during transfers. *see 'mobility' section for detail. )         Communication   Communication: No difficulties  Cognition Arousal/Alertness: Awake/alert Behavior During Therapy: WFL for tasks assessed/performed Overall Cognitive Status: History of cognitive impairments - at baseline       Memory: Decreased short-term memory;Decreased recall of precautions              General Comments      Exercises        Assessment/Plan    PT Assessment Patient needs continued PT services  PT Diagnosis Difficulty walking;Abnormality of gait;Generalized weakness   PT Problem List Decreased strength;Decreased range of motion;Decreased activity tolerance;Decreased balance;Decreased mobility;Decreased coordination;Decreased cognition  PT Treatment Interventions Balance training;Gait  training;Stair training;Functional mobility training;Therapeutic activities;Therapeutic exercise;Patient/family education;Cognitive remediation;Neuromuscular re-education   PT Goals (Current goals can be found in the Care Plan section) Acute Rehab PT Goals Patient Stated Goal: maintain current indep, improve if possible, remedy FTT.  PT Goal Formulation: With family Time For Goal Achievement: 07/27/15 Potential to Achieve Goals: Fair    Frequency 7X/week (May be downgraded to 3x weekly based on pt's willingness to participate. )   Barriers to discharge        Co-evaluation               End of Session Equipment Utilized During Treatment: Gait belt Activity Tolerance: Patient limited by pain;Patient limited by fatigue Patient left: in bed;with bed alarm set;with call bell/phone within reach;with family/visitor present Nurse Communication: Other (comment)         Time: 5409-8119 PT Time Calculation (min) (ACUTE ONLY): 16 min   Charges:   PT Evaluation $PT Eval Moderate Complexity: 1 Procedure PT Treatments $Therapeutic Activity: 8-22 mins   PT G Codes:        12:50 PM, 2015-08-08 Rosamaria Lints, PT, DPT PRN Physical Therapist at [facility] License # [id number] [phone] (wireless)  [phone] (mobile)

## 2015-07-13 NOTE — Evaluation (Signed)
Clinical/Bedside Swallow Evaluation Patient Details  Name: Sheri Matthews MRN: [id number] Date of Birth: 1923/09/10  Today's Date: 07/13/2015 Time: SLP Start Time (ACUTE ONLY): 1215 SLP Stop Time (ACUTE ONLY): 1241 SLP Time Calculation (min) (ACUTE ONLY): 26 min  Past Medical History:  Past Medical History  Diagnosis Date  . Hypertension   . Arthritis   . Shortness of breath   . Stroke (HCC)   . GERD (gastroesophageal reflux disease)   . Dysrhythmia   . Pneumonia   . Depression   . Anemia   . Dementia with behavioral disturbance 04/10/2013  . Acute renal failure (HCC) 04/10/2013  . A-fib (HCC)   . Failure to thrive in adult   . Orthostatic hypotension   . Depression    Past Surgical History:  Past Surgical History  Procedure Laterality Date  . Back surgery    . Ovaries  removed   HPI:  Sheri Matthews is a 82 y.o. Female with a hx of HTN, arthritis, stroke, dysrhythmia, dementia with behavioral disturbance, acute renal failure, A-Fib, and failure to thrive brought in by EMS from [facility] nursing home who presents to the Emergency Department complaining of a fall onset PTA. Pt states that she is ambulatory with a walker at home. Per triage note, EMS reports that pt has not been eating well for 2 weeks, has had a cough, and fallen several times. Pt said she did not fall and has been eating well. They also report that she had an x-ray performed of the left leg with "inconclusive results." She complains of left toes and left knee pain; she said she fell a year ago and has been having pain ever since. Pt denies SOB, abdominal pain, chest pain, headache, nausea, or vomiting. MRI showed: Acute 9 mm infarct in the posterior superior left cerebellum   Assessment / Plan / Recommendation Clinical Impression  Oral motor examination reveals mild right asymmetry (h/o Bells' Palsy) and general oral/lingual weakness. Pt wears U/L dentures and tongue is coated- ? Thrush. Pt shows no overt  signs/symptoms of aspiration, however po intake was limited due to pt declining. She required frequent encouragement to "take just a little more". Family provided education and tips on ways to increase intake in individuals with dementia (offer po, eat a little, remove, present food again in 30 minutes; encourage self-feeding, finger foods, sweets as allowed). Daughters appreciative of visit and SLP advised that pt would be seen while in acute setting for diet tolerance and education as needed. Given that pt took very little, family was educated on signs/symptoms of aspiration and to alert RN if any concerns arise.    Aspiration Risk  Mild aspiration risk    Diet Recommendation Dysphagia 3 (Mech soft);Thin liquid   Liquid Administration via: Cup;Straw Medication Administration: Whole meds with liquid Supervision: Patient able to self feed;Full supervision/cueing for compensatory strategies Compensations: Minimize environmental distractions;Slow rate;Small sips/bites Postural Changes: Seated upright at 90 degrees;Remain upright for at least 30 minutes after po intake (h/o reflux)    Other  Recommendations Oral Care Recommendations: Oral care BID;Staff/trained caregiver to provide oral care (brush to tongue; ? thrush) Other Recommendations: Clarify dietary restrictions   Follow up Recommendations  24 hour supervision/assistance    Frequency and Duration min 2x/week  1 week       Prognosis Prognosis for Safe Diet Advancement: Good Barriers to Reach Goals: Cognitive deficits;Behavior (limited po intake)      Swallow Study   General Date of Onset: 07/12/15  HPI: Sheri Matthews is a 82 y.o. Female with a hx of HTN, arthritis, stroke, dysrhythmia, dementia with behavioral disturbance, acute renal failure, A-Fib, and failure to thrive brought in by EMS from [facility] nursing home who presents to the Emergency Department complaining of a fall onset PTA. Pt states that she is ambulatory with a  walker at home. Per triage note, EMS reports that pt has not been eating well for 2 weeks, has had a cough, and fallen several times. Pt said she did not fall and has been eating well. They also report that she had an x-ray performed of the left leg with "inconclusive results." She complains of left toes and left knee pain; she said she fell a year ago and has been having pain ever since. Pt denies SOB, abdominal pain, chest pain, headache, nausea, or vomiting. MRI showed: Acute 9 mm infarct in the posterior superior left cerebellum Type of Study: Bedside Swallow Evaluation Previous Swallow Assessment: none on record Diet Prior to this Study: NPO Temperature Spikes Noted: No Respiratory Status: Room air History of Recent Intubation: No Behavior/Cognition: Alert;Cooperative;Pleasant mood Oral Cavity Assessment:  (tongue is coated- ? thrush) Oral Care Completed by SLP: Yes Oral Cavity - Dentition: Dentures, top;Dentures, bottom Vision: Functional for self-feeding Self-Feeding Abilities: Able to feed self;Needs assist Patient Positioning: Upright in bed Baseline Vocal Quality: Normal Volitional Cough: Congested;Strong Volitional Swallow: Able to elicit    Oral/Motor/Sensory Function Overall Oral Motor/Sensory Function: Mild impairment Facial ROM: Reduced right Facial Symmetry: Within Functional Limits Facial Strength: Within Functional Limits Facial Sensation: Within Functional Limits Lingual ROM: Within Functional Limits Lingual Symmetry: Within Functional Limits Lingual Strength: Reduced Lingual Sensation: Within Functional Limits Velum: Within Functional Limits Mandible: Within Functional Limits   Ice Chips Ice chips: Within functional limits Presentation: Spoon   Thin Liquid Thin Liquid: Within functional limits Presentation: Cup;Straw;Self Fed;Spoon    Nectar Thick Nectar Thick Liquid: Not tested   Honey Thick Honey Thick Liquid: Not tested   Puree Puree: Within functional  limits Presentation: Spoon   Solid   Thank you,  Havery Moros, CCC-SLP [phone]    Solid: Within functional limits Presentation: Self Fed        PORTER,DABNEY 07/13/2015,12:49 PM

## 2015-07-13 NOTE — Evaluation (Signed)
Occupational Therapy Evaluation Patient Details Name: Sheri Matthews MRN: [id number] DOB: 1923-07-17 Today's Date: 07/13/2015    History of Present Illness Pt is a 82 y.o. female with hx of HTN, chron vertigo, TIA, anemia/ PUD, and more recently worsening dementia over the past few years. Presenting with poor po intake, cough and falling. Vitals OK in ED, labs show creat 1.43, alb 2.7, Hb 10.6 otherwise wnl. Denies dysuria, CP, fevers. Pt with acute cerebellar CVA, small infarct.    Clinical Impression   Pt awake, alert, oriented to person and place, agreeable to evaluation. Pt with dementia, limiting reliable history. Per chart review pt is resident at [facility] and uses walker for functional mobility tasks. Pt requiring min-mod assist for bed mobility, refusing to attempt sit-stand or functional mobility due to dizziness, stating "My head is just spinning round and round", dizziness resolves upon return to supine position. Pt able to complete ADL tasks at [facility]. I suspect pt receives assistance at [facility] for ADL tasks, however an OT evaluation on return to ALF/LTC would be beneficial to determine baseline functioning in her home environment and any necessary OT services may then be arranged.     Follow Up Recommendations  Other (comment);Supervision/Assistance - 24 hour (return to ALF/LTC with OT evaluation-in house or HHOT)    Equipment Recommendations  None recommended by OT       Precautions / Restrictions Precautions Precautions: Fall Restrictions Weight Bearing Restrictions: No      Mobility Bed Mobility Overal bed mobility: Needs Assistance Bed Mobility: Supine to Sit;Sit to Supine     Supine to sit: Min assist;HOB elevated Sit to supine: Mod assist;HOB elevated   General bed mobility comments: Pt requiring assistance for bed mobility due to generalized weakness  Transfers                 General transfer comment: pt refused to attempt transfer due  to dizziness         ADL Overall ADL's : Needs assistance/impaired                     Lower Body Dressing: Min guard;Sitting/lateral leans Lower Body Dressing Details (indicate cue type and reason): pt doffed and donned socks with minimal difficulty               General ADL Comments: Pt declined to perform standing ADL tasks due to dizziness     Vision Vision Assessment?: Yes Eye Alignment: Within Functional Limits Ocular Range of Motion: Within Functional Limits Alignment/Gaze Preference: Within Defined Limits Tracking/Visual Pursuits: Able to track stimulus in all quads without difficulty Saccades: Within functional limits Convergence: Within functional limits Visual Fields: No apparent deficits          Pertinent Vitals/Pain Pain Assessment: No/denies pain     Hand Dominance Right   Extremity/Trunk Assessment Upper Extremity Assessment Upper Extremity Assessment: Overall WFL for tasks assessed   Lower Extremity Assessment Lower Extremity Assessment: Defer to PT evaluation       Communication Communication Communication: No difficulties   Cognition Arousal/Alertness: Awake/alert Behavior During Therapy: WFL for tasks assessed/performed Overall Cognitive Status: History of cognitive impairments - at baseline (dementia)                                Home Living Family/patient expects to be discharged to:: Assisted living  Home Equipment: Other (comment) (unknown)          Prior Functioning/Environment Level of Independence: Needs assistance  Gait / Transfers Assistance Needed: Per chart review, pt ambulates with walker at [facility] ADL's / Homemaking Assistance Needed: Pt reports independence, however suspect pt required assistance with ADL tasks        OT Diagnosis: Generalized weakness;Cognitive deficits   OT Problem List: Decreased activity tolerance;Impaired balance (sitting  and/or standing);Decreased cognition;Decreased safety awareness    End of Session    Activity Tolerance: Patient tolerated treatment well Patient left: in bed;with call bell/phone within reach;with bed alarm set   Time: 0822-0850 OT Time Calculation (min): 28 min Charges:  OT General Charges $OT Visit: 1 Procedure OT Evaluation $OT Eval Moderate Complexity: 1 Procedure  Ezra Sites, OTR/L  [phone]  07/13/2015, 9:05 AM

## 2015-07-14 LAB — VITAMIN B12: VITAMIN B 12: 4446 pg/mL — AB (ref 180–914)

## 2015-07-14 LAB — TSH: TSH: 1.298 u[IU]/mL (ref 0.350–4.500)

## 2015-07-14 MED ORDER — ASPIRIN 325 MG PO TABS: 325.0000 mg | ORAL_TABLET | Freq: Every day | ORAL | Status: AC

## 2015-07-14 NOTE — Discharge Summary (Signed)
Physician Discharge Summary  Patient ID: Sheri Matthews MRN: [id number] DOB/AGE: 01/09/1924 82 y.o. Primary Care Physician:Demetry Bendickson L, MD Admit date: 07/12/2015 Discharge date: 07/14/2015    Discharge Diagnoses:   Active Problems:   Failure to thrive in adult   Atrial fibrillation (HCC)   Dehydration   Cerebellar infarct (HCC)   Gait abnormality   General weakness   Acute bronchitis   Falls   CVA (cerebral infarction)     Medication List    STOP taking these medications        ALPRAZolam 0.25 MG tablet  Commonly known as:  XANAX     aspirin EC 81 MG tablet  Replaced by:  aspirin 325 MG tablet     metoprolol tartrate 12.5 mg Tabs tablet  Commonly known as:  LOPRESSOR      TAKE these medications        acetaminophen 500 MG tablet  Commonly known as:  TYLENOL  Take 500 mg by mouth every 6 (six) hours as needed for mild pain or moderate pain.     aspirin 325 MG tablet  Take 1 tablet (325 mg total) by mouth daily.     busPIRone 5 MG tablet  Commonly known as:  BUSPAR  Take 2.5 mg by mouth 2 (two) times daily.     calcium-vitamin D 500-200 MG-UNIT tablet  Commonly known as:  OSCAL WITH D  Take 1 tablet by mouth 2 (two) times daily.     cholecalciferol 1000 units tablet  Commonly known as:  VITAMIN D  Take 1,000 Units by mouth daily.     citalopram 20 MG tablet  Commonly known as:  CELEXA  Take 20 mg by mouth daily.     diltiazem 120 MG 24 hr capsule  Commonly known as:  CARDIZEM CD  Take 1 capsule (120 mg total) by mouth daily.     donepezil 10 MG tablet  Commonly known as:  ARICEPT  Take 10 mg by mouth at bedtime.     fluticasone 50 MCG/ACT nasal spray  Commonly known as:  FLONASE  Place 2 sprays into both nostrils daily.     latanoprost 0.005 % ophthalmic solution  Commonly known as:  XALATAN  Place 1 drop into the right eye at bedtime.     loratadine 10 MG tablet  Commonly known as:  CLARITIN  Take 10 mg by mouth daily.      multivitamin with minerals Tabs tablet  Take 1 tablet by mouth daily.     Omega 3 1000 MG Caps  Take 1 capsule by mouth 2 (two) times daily.     omeprazole 20 MG capsule  Commonly known as:  PRILOSEC  Take 20 mg by mouth daily.     vitamin B-12 1000 MCG tablet  Commonly known as:  CYANOCOBALAMIN  Take 1,000 mcg by mouth daily.        Discharged Condition: Improved    Consults: Neurology  Significant Diagnostic Studies: Dg Chest 2 View  07/12/2015  CLINICAL DATA:  Cough.  Fell at nursing home. EXAM: CHEST  2 VIEW COMPARISON:  09/22/2014. FINDINGS: The cardiac silhouette, mediastinal and hilar contours are within normal limits and stable. There is tortuosity and calcification of the thoracic aorta. The lungs are clear. No pleural effusion. The bony thorax is intact. Stable T12 compression deformity. IMPRESSION: No acute cardiopulmonary findings. Electronically Signed   By: Rudie Meyer M.D.   On: 07/12/2015 14:07   Ct Head Wo Contrast  07/12/2015  CLINICAL DATA:  Pt not eating well over the last 2 weeks; Pt has fallen several times recently and had an xray 2 weeks ago of left leg but was "inconclusive." EXAM: CT HEAD WITHOUT CONTRAST TECHNIQUE: Contiguous axial images were obtained from the base of the skull through the vertex without intravenous contrast. COMPARISON:  CT 04/07/2013 FINDINGS: No acute intracranial hemorrhage. No focal mass lesion. No CT evidence of acute infarction. No midline shift or mass effect. No hydrocephalus. Basilar cisterns are patent. There are periventricular and subcortical white matter hypodensities. Generalized cortical atrophy. Dense calcification in the LEFT parietal lobe associated with the dura is likely a benign meningioma and not changed from prior. Low-density lesions in the LEFT cerebellum consistent with remote infarctions. IMPRESSION: 1. Low-density lesions in the LEFT cerebellum are new from prior but have the appearance of remote infarctions. 2.  No acute intracranial findings. 3. Atrophy and white matter microvascular disease. Electronically Signed   By: Genevive Bi M.D.   On: 07/12/2015 13:49   Mr Angiogram Head Wo Contrast  07/12/2015  CLINICAL DATA:  Altered mental status.  Dementia.  Acute infarct. EXAM: MRA HEAD WITHOUT CONTRAST TECHNIQUE: Angiographic images of the Circle of Willis were obtained using MRA technique without intravenous contrast. COMPARISON:  MRI brain from the same day. FINDINGS: The study is severely degraded by patient motion. The most superior slab is not diagnostic due to the motion. The internal carotid arteries are intact bilaterally through the ICA termini without significant focal stenosis. The vessels are poorly visualized beyond the proximal A1 or M1 segments. MCA and ACA branch vessels are not visualized. The vertebral arteries appear codominant. The basilar artery is intact. The proximal PCAs are not well seen. IMPRESSION: 1. The proximal internal carotid arteries and vertebrobasilar system demonstrate no significant focal stenosis. 2. The more distal vessels cannot be assessed due to extreme patient motion. Electronically Signed   By: Marin Roberts M.D.   On: 07/12/2015 17:10   Mr Brain Wo Contrast  07/12/2015  CLINICAL DATA:  Difficulty walking.  Abnormal CT. EXAM: MRI HEAD WITHOUT CONTRAST TECHNIQUE: Multiplanar, multiecho pulse sequences of the brain and surrounding structures were obtained without intravenous contrast. COMPARISON:  CT head without contrast from the same day. MRI brain 08/27/2009. FINDINGS: Acute nonhemorrhagic infarct is present within the medial superior left cerebellum. The more lateral and inferior left cerebellar infarcts are remote. These were not present in 2011. An additional right remote cerebellar lacunar infarct is present as well. Progressive diffuse atrophy and white matter disease is present. There multiple remote lacunar infarcts of the basal ganglia bilaterally. Most of  these were present on the prior study. White matter changes extend into the brainstem with some progression since the prior exam. Flow is present in the major intracranial arteries. Bilateral lens replacements are noted. The globes and orbits are otherwise intact. Mild mucosal thickening is present in the anterior paranasal sinuses. Fluid levels are present in the sphenoid sinuses bilaterally. The mastoid air cells are clear. The skullbase is within normal limits. Midline sagittal images demonstrate atrophy but no focal lesion. IMPRESSION: 1. Acute 9 mm infarct in the posterior superior left cerebellum without associated hemorrhage. 2. Remote lacunar infarcts of the left cerebellum are otherwise remote. These are new since 2011 and likely new since the previous CT of 2014. 3. Progressive diffuse atrophy and white matter disease. 4. Multiple bilateral remote lacunar infarcts of the basal ganglia. Electronically Signed   By: Marin Roberts M.D.   On:  07/12/2015 17:08   Dg Knee Complete 4 Views Left  07/12/2015  CLINICAL DATA:  Pain following fall EXAM: LEFT KNEE - COMPLETE 4+ VIEW COMPARISON:  April 02, 2011 FINDINGS: Frontal, lateral, and bilateral oblique views were obtained. There is no fracture or dislocation. There is a small joint effusion. There is mild generalized joint space narrowing. No erosive change. There is extensive chondrocalcinosis. IMPRESSION: Small joint effusion. Mild generalized osteoarthritis. No fracture or dislocation. Extensive chondrocalcinosis. Chondrocalcinosis may be seen with osteoarthritis but also may be seen with calcium pyrophosphate deposition disease which may present clinically as pseudogout. Electronically Signed   By: Bretta Bang III M.D.   On: 07/12/2015 14:04   Dg Foot Complete Left  07/12/2015  CLINICAL DATA:  Left foot pain after fall. EXAM: LEFT FOOT - COMPLETE 3+ VIEW COMPARISON:  None. FINDINGS: There is no evidence of fracture or dislocation. Mild  hallux valgus deformity of the first metatarsophalangeal joint is noted. Other joint spaces appear intact. Soft tissues are unremarkable. IMPRESSION: Mild hallux valgus deformity of the first metatarsophalangeal joint. No fracture or dislocation is noted. Electronically Signed   By: Lupita Raider, M.D.   On: 07/12/2015 14:08   Dg Hip Unilat With Pelvis 2-3 Views Left  07/12/2015  CLINICAL DATA:  Initial encounter for complaining of a fall onset PTA. cough, and fallen several times-----previous x-ray performed of the left leg with "inconclusive results." She complains of left toes and left knee pain; she said she fell a year ago and has been having pain ever since. EXAM: DG HIP (WITH OR WITHOUT PELVIS) 2-3V LEFT COMPARISON:  None. FINDINGS: AP view of the pelvis and AP/frog leg views of the left hip. AP view of the pelvis is degraded secondary to overlying soft tissues, obscuring the iliac crests. Mild osteopenia. Vascular calcifications. No acute fracture. Joint spaces are maintained for age. IMPRESSION: No acute osseous abnormality. Electronically Signed   By: Jeronimo Greaves M.D.   On: 07/12/2015 14:05    Lab Results: Basic Metabolic Panel:  Recent Labs  04/54/09 1302  NA 138  K 3.7  CL 103  CO2 22  GLUCOSE 93  BUN 35*  CREATININE 1.43*  CALCIUM 9.7   Liver Function Tests:  Recent Labs  07/12/15 1302  AST 21  ALT 12*  ALKPHOS 71  BILITOT <0.1*  PROT 6.6  ALBUMIN 2.7*     CBC:  Recent Labs  07/12/15 1302  WBC 5.6  NEUTROABS 3.2  HGB 10.6*  HCT 32.9*  MCV 84.8  PLT 403*    Recent Results (from the past 240 hour(s))  MRSA PCR Screening     Status: Abnormal   Collection Time: 07/12/15 11:10 PM  Result Value Ref Range Status   MRSA by PCR POSITIVE (A) NEGATIVE Final    Comment:        The GeneXpert MRSA Assay (FDA approved for NASAL specimens only), is one component of a comprehensive MRSA colonization surveillance program. It is not intended to diagnose  MRSA infection nor to guide or monitor treatment for MRSA infections. RESULT CALLED TO, READ BACK BY AND VERIFIED WITH: Gabriel Earing AT 0227 ON [id number] BY [facility] Course: This is an 82 year old who is having more trouble with ambulation. She came to the emergency department and was found to have an acute stroke which was small. She also had hardening of the arteries in her brain. She is known to have dementia. She has arthritis as  well. She had stroke workup which was negative except for the finding of the stroke on MRI had neurology consultation and it was felt that she should have her aspirin increased from low-dose 81 mg to 325 mg. PT felt that she could have outpatient PT. She was back at baseline at time of discharge  Discharge Exam: Blood pressure 151/75, pulse 84, temperature 98.6 F (37 C), temperature source Oral, resp. rate 20, height  (1.626 m), weight 57 kg (125 lb 10.6 oz), SpO2 94 %. She is awake and confused. Chest is clear. Heart is irregular as always.  Disposition: Back to her assisted living facility. She will have PT and RN to see her. She will be on mechanical soft diet. Increase her aspirin to 325 mg.      Discharge Instructions    Discharge patient    Complete by:  As directed      Face-to-face encounter (required for Medicare/Medicaid patients)    Complete by:  As directed   I Ozzie Remmers L certify that this patient is under my care and that I, or a nurse practitioner or physician's assistant working with me, had a face-to-face encounter that meets the physician face-to-face encounter requirements with this patient on 07/14/2015. The encounter with the patient was in whole, or in part for the following medical condition(s) which is the primary reason for home health care (List medical condition): Stroke  The encounter with the patient was in whole, or in part, for the following medical condition, which is the primary reason for home health care:   stroke  I certify that, based on my findings, the following services are medically necessary home health services:   Nursing Physical therapy    Reason for Medically Necessary Home Health Services:  Skilled Nursing- Change/Decline in Patient Status  My clinical findings support the need for the above services:  Unable to leave home safely without assistance and/or assistive device  Further, I certify that my clinical findings support that this patient is homebound due to:  Unsafe ambulation due to balance issues     Home Health    Complete by:  As directed   To provide the following care/treatments:   PT RN               Signed: Alisan Dokes L   07/14/2015, 8:48 AM

## 2015-07-14 NOTE — Clinical Social Work Note (Signed)
Clinical Social Work Assessment  Patient Details  Name: Sheri Matthews MRN: [id number] Date of Birth: [birth year]  Date of referral:  07/14/15               Reason for consult:  Facility Placement                Permission sought to share information with:  Family Supports Permission granted to share information::  Yes, Verbal Permission Granted  Name::     Butch Penny, daughter   Housing/Transportation Living arrangements for the past 2 months:  [place] of Information:  Adult Children Patient Interpreter Needed:  None Criminal Activity/Legal Involvement Pertinent to Current Situation/Hospitalization:  No - Comment as needed Significant Relationships:  Adult Children Lives with:  Facility Resident Do you feel safe going back to the place where you live?  Yes Need for family participation in patient care:  Yes (Comment)  Care giving concerns:  No care giving concerns identified.   Social Worker assessment / plan:  CSW met with pt and family to address consult. Pt was admitted from [place]. Pt was sleeping soundly during assessment. CSW introduced herself and explained role of social work. CSW also explained the process of returning to ALF. Family would like for pt to return at discharge. Per MD, pt is ready for discharge today. CSW sent discharge information to facility. Facility is able to accept pt at discharge. RN will call report and [facility] will provide transportation. Pt's family is in agreement with discharge plan. CSW is signing off as no further needs identified.   Employment status:  Retired Forensic scientist:  Information systems manager, Medicaid In [place] PT Recommendations:  Home with [place] (Pt is open to [facility] and will resume care. ) Information / Referral to community resources:  Other (Comment Required) [facility])  Patient/Family's Response to care:  Pt's family was appreciative of CSW support.   Patient/Family's  Understanding of and Emotional Response to Diagnosis, Current Treatment, and Prognosis:  Pt's family understands that pt is to return to ALF.   Emotional Assessment Appearance:  Appears older than stated age Attitude/Demeanor/Rapport:  Other (sleeping, spoke with family. ) Affect (typically observed):  Other (sleeping, spoke with family) Orientation:  Fluctuating Orientation (Suspected and/or reported Sundowners) Alcohol / Substance use:  Never Used Psych involvement (Current and /or in the community):  No (Comment)  Discharge Needs  Concerns to be addressed:  Adjustment to Illness Readmission within the last 30 days:  No Current discharge risk:  None Barriers to Discharge:  No Barriers Identified   Darden Dates, LCSW 07/14/2015, 1:07 PM

## 2015-07-14 NOTE — Care Management Important Message (Signed)
Important Message  Patient Details  Name: Sheri Matthews MRN: [id number] Date of Birth: 07-14-1923   Medicare Important Message Given:  Yes    Adonis Huguenin, RN 07/14/2015, 12:20 PM

## 2015-07-14 NOTE — NC FL2 (Signed)
[place] MEDICAID FL2 LEVEL OF CARE SCREENING TOOL     IDENTIFICATION  Patient Name: Sheri Matthews Birthdate: Sep 15, 1923 Sex: female Admission Date (Current Location): 07/12/2015  [facility] and [place] Number:  [employer] and Address:  [facility],  [address], [place] [address]      Provider Number: [id number]  Attending Physician Name and Address:  Kari Baars, MD  Relative Name and Phone Number:       Current Level of Care: Hospital Recommended Level of Care: Assisted Living Facility Prior Approval Number:    Date Approved/Denied:   PASRR Number:    Discharge Plan: Domiciliary (Rest home)    Current Diagnoses: Patient Active Problem List   Diagnosis Date Noted  . Cerebellar infarct (HCC) 07/12/2015  . Gait abnormality 07/12/2015  . General weakness 07/12/2015  . Acute bronchitis 07/12/2015  . Falls 07/12/2015  . CVA (cerebral infarction) 07/12/2015  . Dementia with behavioral disturbance 04/10/2013  . Failure to thrive in adult 04/10/2013  . Distal radial fracture 04/10/2013  . Atrial fibrillation (HCC) 04/10/2013  . Dehydration 04/10/2013  . Acute renal failure (HCC) 04/10/2013  . Multiple falls 04/10/2013  . Orthostatic hypotension 04/10/2013  . Atrial fibrillation with rapid ventricular response (HCC) 07/05/2012  . Essential hypertension, benign 07/05/2012  . Left-sided chest wall pain 07/05/2012  . ANEMIA 01/19/2010  . PUD 01/19/2010  . DIARRHEA 01/19/2010    Orientation RESPIRATION BLADDER Height & Weight     Self  Normal Incontinent Weight: 125 lb 10.6 oz (57 kg) Height:   (162.6 cm)  BEHAVIORAL SYMPTOMS/MOOD NEUROLOGICAL BOWEL NUTRITION STATUS      Incontinent Diet (DYS 3, Thin Liquids, Aspriation Precautions)  AMBULATORY STATUS COMMUNICATION OF NEEDS Skin   Limited Assist Verbally Normal                       Personal Care Assistance Level of Assistance  Bathing, Feeding, Dressing Bathing  Assistance: Limited assistance Feeding assistance: Independent Dressing Assistance: Limited assistance     Functional Limitations Info  Sight, Hearing, Speech Sight Info: Adequate Hearing Info: Adequate Speech Info: Adequate    SPECIAL CARE FACTORS FREQUENCY  PT (By licensed PT) (Home Health)                    Contractures      Additional Factors Info  Code Status, Allergies Code Status Info: Full Code Allergies Info: No known allergies           Current Medications (07/14/2015):  This is the current hospital active medication list Current Facility-Administered Medications  Medication Dose Route Frequency Provider Last Rate Last Dose  .  stroke: mapping our early stages of recovery book   Does not apply Once Delano Metz, MD      . 0.9 %  sodium chloride infusion   Intravenous Continuous Delano Metz, MD 75 mL/hr at 07/13/15 1324 1 mL at 07/13/15 1324  . acetaminophen (TYLENOL) tablet 500 mg  500 mg Oral Q6H PRN Delano Metz, MD   500 mg at 07/12/15 2335  . antiseptic oral rinse (CPC / CETYLPYRIDINIUM CHLORIDE 0.05%) solution 7 mL  7 mL Mouth Rinse BID Kari Baars, MD   7 mL at 07/13/15 1300  . aspirin tablet 325 mg  325 mg Oral Daily Beryle Beams, MD      . busPIRone (BUSPAR) tablet 2.5 mg  2.5 mg Oral BID Delano Metz, MD   2.5 mg at 07/13/15  1303  . calcium-vitamin D (OSCAL WITH D) 500-200 MG-UNIT per tablet 1 tablet  1 tablet Oral BID Delano Metz, MD   1 tablet at 07/13/15 1304  . cholecalciferol (VITAMIN D) tablet 1,000 Units  1,000 Units Oral Daily Delano Metz, MD   1,000 Units at 07/13/15 1315  . citalopram (CELEXA) tablet 20 mg  20 mg Oral Daily Delano Metz, MD   20 mg at 07/13/15 1304  . diltiazem (CARDIZEM CD) 24 hr capsule 120 mg  120 mg Oral Daily Delano Metz, MD   120 mg at 07/13/15 1308  . donepezil (ARICEPT) tablet 10 mg  10 mg Oral QHS Delano Metz, MD   10 mg at 07/12/15 2345  . haloperidol lactate (HALDOL) injection 1 mg  1 mg  Intravenous Once Leda Gauze, NP      . latanoprost (XALATAN) 0.005 % ophthalmic solution 1 drop  1 drop Right Eye QHS Delano Metz, MD   1 drop at 07/12/15 2346  . levofloxacin (LEVAQUIN) tablet 500 mg  500 mg Oral Q48H Delano Metz, MD      . metoprolol (LOPRESSOR) tablet 50 mg  50 mg Oral BID Delano Metz, MD   50 mg at 07/13/15 1305  . multivitamin with minerals tablet 1 tablet  1 tablet Oral Daily Delano Metz, MD   1 tablet at 07/13/15 1307  . omega-3 acid ethyl esters (LOVAZA) capsule 1 g  1 capsule Oral BID Delano Metz, MD   1 g at 07/13/15 1314  . pantoprazole (PROTONIX) EC tablet 40 mg  40 mg Oral Daily Delano Metz, MD   40 mg at 07/13/15 1306  . senna-docusate (Senokot-S) tablet 1 tablet  1 tablet Oral QHS PRN Delano Metz, MD      . vitamin B-12 (CYANOCOBALAMIN) tablet 1,000 mcg  1,000 mcg Oral Daily Delano Metz, MD   1,000 mcg at 07/13/15 1316     Discharge Medications: Please see discharge summary for a list of discharge medications.  Relevant Imaging Results:  Relevant Lab Results:   Additional Information Pt will have home health PT with [facility]  Dede Query, LCSW

## 2015-07-14 NOTE — Progress Notes (Signed)
Patient's IV removed.  Site WNL.  Patient transported by EMS via stretcher to return to [facility].  Packet given to EMS.  Patient stable at time of discharge.

## 2015-07-14 NOTE — Progress Notes (Signed)
Physical Therapy Treatment Patient Details Name: Sheri Matthews MRN: [id number] DOB: [birth year] Today's Date: 07/14/2015    History of Present Illness 82yo white female resident of [address] in [place], comes to [facility] after 71m decline in mobility, PO intake, and multiple falls. Pt found to have acute cerebellar infarct, and remote cerebellar/basal ganglia infarcts indeterminate in age, but new since 2014.  PMH: is complex and high density, includes dementia, which as family describes is in early stage.     PT Comments    Pt found sleeping in her bed with family members present. She was oriented to person only and lethargic. Family reporting she has increased lethargy from yesterday. Pt unable to assist much with bed mobility, requiring MaxA x2, with pt resisting sitting EOB. Attempted several trials without success. Pt with increased pain sensitivity with touch to BLE. She will continue to benefit from PT while at [facility] to address her limitations.  Follow Up Recommendations  Other (comment);Supervision for mobility/OOB;Supervision/Assistance - 24 hour (return to [facility] ALF c PT services there 3x weekly. )     Equipment Recommendations  Other (comment)    Recommendations for Other Services       Precautions / Restrictions Restrictions Weight Bearing Restrictions: No    Mobility  Bed Mobility Overal bed mobility: +2 for physical assistance Bed Mobility: Supine to Sit;Sit to Supine     Supine to sit: HOB elevated;+2 for physical assistance Sit to supine: HOB elevated;+2 for physical assistance   General bed mobility comments: Pt unwilling to sit EOB with therapist, reporting increased pain with any movement of LE. Therapist attempted to move pt EOB x3 trials with pt resisting transfer  Transfers Overall transfer level: Needs assistance (Unable to attempt this session. )                  Ambulation/Gait                 Stairs            Wheelchair  Mobility    Modified Rankin (Stroke Patients Only)       Balance     Sitting balance-Leahy Scale: Poor       Standing balance-Leahy Scale: Zero                      Cognition Arousal/Alertness: Lethargic Behavior During Therapy: Agitated Overall Cognitive Status: Impaired/Different from baseline Area of Impairment: Orientation Orientation Level: Disoriented to;Place;Time;Situation   Memory: Decreased short-term memory              Exercises      General Comments        Pertinent Vitals/Pain Pain Assessment: 0-10 Pain Score:  (unable to rate pain) Pain Location: "all over" Pain Intervention(s): Monitored during session    Home Living Family/patient expects to be discharged to:: Assisted living [facility] )             Home Equipment:  (unclear) Additional Comments: was using WC or RW for ambulation. Limited due to chronic orthostasis issues.     Prior Function Level of Independence: Needs assistance  Gait / Transfers Assistance Needed: Family reports patient has been mostly in bed for past month.        PT Goals (current goals can now be found in the care plan section) Acute Rehab PT Goals Patient Stated Goal: maintain current indep, improve if possible, remedy FTT.  PT Goal Formulation: With family Time  For Goal Achievement: 07/27/15 Potential to Achieve Goals: Fair Progress towards PT goals: Not progressing toward goals - comment (increased lethargy this session limiting progress towards goals)    Frequency  Min 3X/week    PT Plan Frequency needs to be updated    Co-evaluation             End of Session   Activity Tolerance: Patient limited by fatigue;Treatment limited secondary to agitation;Patient limited by lethargy;Patient limited by pain Patient left: in bed;with bed alarm set;with call bell/phone within reach;with family/visitor present     Time: 1315-1340 PT Time Calculation (min) (ACUTE ONLY): 25  min  Charges:  $Therapeutic Activity: 23-37 mins                    G Codes:      5:09 PM,08-02-2015 Marylyn Ishihara PT, DPT [facility] [phone]

## 2015-07-14 NOTE — Progress Notes (Signed)
Late entry:  Patient lethargic this afternoon but will awaken to voice.  VS stable per flowsheet.  Patient's daughter, Sheri Matthews, stated that she thinks her mother is sleeping more because she was "sundowning when she left last night and was very active."  [name] (patient's daughter) verbalized that she "wonder if she has had another stroke.  If she has, we would not want anymore testing to be done.  We just want her to go back to [facility].  We do not want anymore tests"  RN called Dr. Juanetta Gosling and notified him of daughters thoughts about their mother and the possibility of another stroke.  Notified Dr. Juanetta Gosling that daughters stated that they "do not want any more testing done and that even if she has another stroke they will not do anything different."  Dr. Juanetta Gosling stated that patient is failure to thrive and plans would not change.  Notified of patient's VS and patient will arouse to voice.  Patient to return to [facility] via EMS this afternoon.

## 2015-07-14 NOTE — Progress Notes (Signed)
Patient refusing morning medicine and vital signs.  Heart rate afib 97-118.  Dr. Juanetta Gosling notified and stated that patient has been confused and refusing medicine.  Also notified that she had refused her evening medicine last night.  Continue with discharge back to [facility].

## 2015-07-14 NOTE — Progress Notes (Signed)
Patient lethargic this morning and refused morning medicine.  Daughters came to visit and patient agreed to take some of her morning medications.  Patient laying in the bed and moaning.  Patient moaned when daughters tried to awake her.  Patient's daughters kept tickling her left foot.  Patient awoke and yelled and them raising her arms stating, "Leave my damn foot alone Sheri Matthews."  Dr. Juanetta Gosling paged and notified.  Dr. Juanetta Gosling stated that patient has been declining with failure to thrive.  Patient's daughters report that patient has not been eating over the past few weeks and they have been seeing "more sundowning/confusion" with her over the past few weeks.  Patient to return to assisted living this afternoon with home health to follow.

## 2015-07-15 LAB — HOMOCYSTEINE: HOMOCYSTEINE-NORM: 6.2 umol/L (ref 0.0–15.0)

## 2015-07-21 ENCOUNTER — Other Ambulatory Visit: Payer: Self-pay

## 2015-07-21 LAB — CBC WITH DIFFERENTIAL/PLATELET
Basophils Absolute: 0 10*3/uL (ref 0.0–0.2)
Basos: 0 %
EOS (ABSOLUTE): 0 10*3/uL (ref 0.0–0.4)
EOS: 0 %
HEMATOCRIT: 32.2 % — AB (ref 34.0–46.6)
Hemoglobin: 10.5 g/dL — ABNORMAL LOW (ref 11.1–15.9)
IMMATURE GRANULOCYTES: 1 %
Immature Grans (Abs): 0.1 10*3/uL (ref 0.0–0.1)
Lymphocytes Absolute: 1.1 10*3/uL (ref 0.7–3.1)
Lymphs: 6 %
MCH: 27.8 pg (ref 26.6–33.0)
MCHC: 32.6 g/dL (ref 31.5–35.7)
MCV: 85 fL (ref 79–97)
MONOCYTES: 9 %
MONOS ABS: 1.5 10*3/uL — AB (ref 0.1–0.9)
NEUTROS PCT: 84 %
Neutrophils Absolute: 14.2 10*3/uL — ABNORMAL HIGH (ref 1.4–7.0)
PLATELETS: 514 10*3/uL — AB (ref 150–379)
RBC: 3.78 x10E6/uL (ref 3.77–5.28)
RDW: 13.4 % (ref 12.3–15.4)
WBC: 17 10*3/uL — AB (ref 3.4–10.8)

## 2015-07-22 ENCOUNTER — Inpatient Hospital Stay (HOSPITAL_COMMUNITY)
Admission: EM | Admit: 2015-07-22 | Discharge: 2015-07-26 | DRG: 202 | Disposition: A | Discharge status: Nursing Home (Includes ICF) | Payer: Medicare Other | Attending: Pulmonary Disease | Admitting: Pulmonary Disease

## 2015-07-22 ENCOUNTER — Emergency Department (HOSPITAL_COMMUNITY): Payer: Medicare Other

## 2015-07-22 ENCOUNTER — Encounter (HOSPITAL_COMMUNITY): Payer: Self-pay

## 2015-07-22 DIAGNOSIS — N390 Urinary tract infection, site not specified: Secondary | ICD-10-CM | POA: Insufficient documentation

## 2015-07-22 DIAGNOSIS — J9801 Acute bronchospasm: Secondary | ICD-10-CM | POA: Diagnosis not present

## 2015-07-22 DIAGNOSIS — J40 Bronchitis, not specified as acute or chronic: Secondary | ICD-10-CM

## 2015-07-22 DIAGNOSIS — I482 Chronic atrial fibrillation, unspecified: Secondary | ICD-10-CM | POA: Insufficient documentation

## 2015-07-22 DIAGNOSIS — F0390 Unspecified dementia without behavioral disturbance: Secondary | ICD-10-CM | POA: Diagnosis present

## 2015-07-22 DIAGNOSIS — J209 Acute bronchitis, unspecified: Principal | ICD-10-CM | POA: Diagnosis present

## 2015-07-22 DIAGNOSIS — Z8673 Personal history of transient ischemic attack (TIA), and cerebral infarction without residual deficits: Secondary | ICD-10-CM

## 2015-07-22 DIAGNOSIS — I10 Essential (primary) hypertension: Secondary | ICD-10-CM | POA: Diagnosis present

## 2015-07-22 DIAGNOSIS — K219 Gastro-esophageal reflux disease without esophagitis: Secondary | ICD-10-CM | POA: Diagnosis present

## 2015-07-22 DIAGNOSIS — Z7982 Long term (current) use of aspirin: Secondary | ICD-10-CM

## 2015-07-22 HISTORY — DX: Unspecified fall, initial encounter: W19.XXXA

## 2015-07-22 LAB — CBC WITH DIFFERENTIAL/PLATELET
Basophils Absolute: 0 10*3/uL (ref 0.0–0.1)
Basophils Relative: 0 %
EOS PCT: 0 %
Eosinophils Absolute: 0 10*3/uL (ref 0.0–0.7)
HEMATOCRIT: 30.8 % — AB (ref 36.0–46.0)
HEMOGLOBIN: 9.9 g/dL — AB (ref 12.0–15.0)
LYMPHS ABS: 1.3 10*3/uL (ref 0.7–4.0)
LYMPHS PCT: 8 %
MCH: 27.3 pg (ref 26.0–34.0)
MCHC: 32.1 g/dL (ref 30.0–36.0)
MCV: 84.8 fL (ref 78.0–100.0)
Monocytes Absolute: 1.9 10*3/uL — ABNORMAL HIGH (ref 0.1–1.0)
Monocytes Relative: 11 %
NEUTROS ABS: 13.7 10*3/uL — AB (ref 1.7–7.7)
NEUTROS PCT: 81 %
Platelets: 532 10*3/uL — ABNORMAL HIGH (ref 150–400)
RBC: 3.63 MIL/uL — AB (ref 3.87–5.11)
RDW: 13.4 % (ref 11.5–15.5)
WBC: 16.9 10*3/uL — AB (ref 4.0–10.5)

## 2015-07-22 LAB — COMPREHENSIVE METABOLIC PANEL
ALK PHOS: 97 U/L (ref 38–126)
ALT: 42 U/L (ref 14–54)
AST: 64 U/L — ABNORMAL HIGH (ref 15–41)
Albumin: 2.2 g/dL — ABNORMAL LOW (ref 3.5–5.0)
Anion gap: 10 (ref 5–15)
BUN: 33 mg/dL — AB (ref 6–20)
CALCIUM: 10.1 mg/dL (ref 8.9–10.3)
CO2: 25 mmol/L (ref 22–32)
CREATININE: 1.13 mg/dL — AB (ref 0.44–1.00)
Chloride: 104 mmol/L (ref 101–111)
GFR, EST AFRICAN AMERICAN: 48 mL/min — AB (ref >60)
GFR, EST NON AFRICAN AMERICAN: 42 mL/min — AB (ref >60)
Glucose, Bld: 199 mg/dL — ABNORMAL HIGH (ref 65–99)
Potassium: 3.6 mmol/L (ref 3.5–5.1)
Sodium: 139 mmol/L (ref 135–145)
Total Bilirubin: 0.4 mg/dL (ref 0.3–1.2)
Total Protein: 7 g/dL (ref 6.5–8.1)

## 2015-07-22 LAB — I-STAT TROPONIN, ED: TROPONIN I, POC: 0.01 ng/mL (ref 0.00–0.08)

## 2015-07-22 LAB — URINALYSIS, ROUTINE W REFLEX MICROSCOPIC
BILIRUBIN URINE: NEGATIVE
GLUCOSE, UA: NEGATIVE mg/dL
Hgb urine dipstick: NEGATIVE
KETONES UR: NEGATIVE mg/dL
Nitrite: NEGATIVE
PH: 5.5 (ref 5.0–8.0)
Protein, ur: NEGATIVE mg/dL
SPECIFIC GRAVITY, URINE: 1.015 (ref 1.005–1.030)

## 2015-07-22 LAB — URINE MICROSCOPIC-ADD ON

## 2015-07-22 LAB — BRAIN NATRIURETIC PEPTIDE: B NATRIURETIC PEPTIDE 5: 223 pg/mL — AB (ref 0.0–100.0)

## 2015-07-22 MED ORDER — ACETAMINOPHEN 325 MG PO TABS
650.0000 mg | ORAL_TABLET | Freq: Once | ORAL | Status: AC
  Administered 2015-07-22: 650 mg via ORAL
  Filled 2015-07-22: qty 2

## 2015-07-22 MED ORDER — ASPIRIN 325 MG PO TABS
325.0000 mg | ORAL_TABLET | Freq: Every day | ORAL | Status: DC
  Administered 2015-07-23 – 2015-07-26 (×4): 325 mg via ORAL
  Filled 2015-07-22 (×4): qty 1

## 2015-07-22 MED ORDER — ONDANSETRON HCL 4 MG/2ML IJ SOLN: 4.0000 mg | Freq: Four times a day (QID) | INTRAMUSCULAR | Status: DC | PRN

## 2015-07-22 MED ORDER — ALBUTEROL SULFATE (2.5 MG/3ML) 0.083% IN NEBU: 2.5000 mg | INHALATION_SOLUTION | Freq: Four times a day (QID) | RESPIRATORY_TRACT | Status: DC

## 2015-07-22 MED ORDER — DONEPEZIL HCL 5 MG PO TABS
10.0000 mg | ORAL_TABLET | Freq: Every day | ORAL | Status: DC
  Administered 2015-07-22 – 2015-07-25 (×4): 10 mg via ORAL
  Filled 2015-07-22 (×4): qty 2

## 2015-07-22 MED ORDER — ONDANSETRON HCL 4 MG PO TABS: 4.0000 mg | ORAL_TABLET | Freq: Four times a day (QID) | ORAL | Status: DC | PRN

## 2015-07-22 MED ORDER — ENOXAPARIN SODIUM 30 MG/0.3ML ~~LOC~~ SOLN
30.0000 mg | SUBCUTANEOUS | Status: DC
  Administered 2015-07-22 – 2015-07-25 (×4): 30 mg via SUBCUTANEOUS
  Filled 2015-07-22 (×4): qty 0.3

## 2015-07-22 MED ORDER — CITALOPRAM HYDROBROMIDE 20 MG PO TABS
20.0000 mg | ORAL_TABLET | Freq: Every day | ORAL | Status: DC
  Administered 2015-07-23 – 2015-07-26 (×4): 20 mg via ORAL
  Filled 2015-07-22 (×4): qty 1

## 2015-07-22 MED ORDER — DILTIAZEM HCL ER COATED BEADS 120 MG PO CP24
120.0000 mg | ORAL_CAPSULE | Freq: Every day | ORAL | Status: DC
  Administered 2015-07-23 – 2015-07-26 (×4): 120 mg via ORAL
  Filled 2015-07-22 (×4): qty 1

## 2015-07-22 MED ORDER — SODIUM CHLORIDE 0.9 % IV BOLUS (SEPSIS)
500.0000 mL | Freq: Once | INTRAVENOUS | Status: AC
  Administered 2015-07-22: 500 mL via INTRAVENOUS

## 2015-07-22 MED ORDER — GUAIFENESIN ER 600 MG PO TB12
600.0000 mg | ORAL_TABLET | Freq: Two times a day (BID) | ORAL | Status: DC
  Administered 2015-07-22 – 2015-07-26 (×8): 600 mg via ORAL
  Filled 2015-07-22 (×8): qty 1

## 2015-07-22 MED ORDER — LATANOPROST 0.005 % OP SOLN
OPHTHALMIC | Status: AC
  Filled 2015-07-22: qty 2.5

## 2015-07-22 MED ORDER — ALBUTEROL SULFATE (2.5 MG/3ML) 0.083% IN NEBU
2.5000 mg | INHALATION_SOLUTION | Freq: Once | RESPIRATORY_TRACT | Status: AC
  Administered 2015-07-22: 2.5 mg via RESPIRATORY_TRACT
  Filled 2015-07-22: qty 3

## 2015-07-22 MED ORDER — LEVOFLOXACIN 500 MG PO TABS
500.0000 mg | ORAL_TABLET | ORAL | Status: DC
Start: 2015-07-24 — End: 2015-07-25
  Administered 2015-07-24: 500 mg via ORAL
  Filled 2015-07-22: qty 1

## 2015-07-22 MED ORDER — IPRATROPIUM BROMIDE 0.02 % IN SOLN: 0.5000 mg | Freq: Four times a day (QID) | RESPIRATORY_TRACT | Status: DC

## 2015-07-22 MED ORDER — IPRATROPIUM-ALBUTEROL 0.5-2.5 (3) MG/3ML IN SOLN
3.0000 mL | Freq: Four times a day (QID) | RESPIRATORY_TRACT | Status: DC
  Administered 2015-07-22 – 2015-07-23 (×4): 3 mL via RESPIRATORY_TRACT
  Filled 2015-07-22 (×6): qty 3

## 2015-07-22 MED ORDER — IPRATROPIUM-ALBUTEROL 0.5-2.5 (3) MG/3ML IN SOLN
3.0000 mL | Freq: Once | RESPIRATORY_TRACT | Status: AC
  Administered 2015-07-22: 3 mL via RESPIRATORY_TRACT
  Filled 2015-07-22: qty 3

## 2015-07-22 MED ORDER — LEVOFLOXACIN IN D5W 750 MG/150ML IV SOLN
750.0000 mg | Freq: Once | INTRAVENOUS | Status: AC
  Administered 2015-07-22: 750 mg via INTRAVENOUS
  Filled 2015-07-22: qty 150

## 2015-07-22 MED ORDER — BUSPIRONE HCL 5 MG PO TABS
2.5000 mg | ORAL_TABLET | Freq: Two times a day (BID) | ORAL | Status: DC
  Administered 2015-07-22 – 2015-07-26 (×8): 2.5 mg via ORAL
  Filled 2015-07-22 (×8): qty 1

## 2015-07-22 MED ORDER — SODIUM CHLORIDE 0.9 % IV SOLN
INTRAVENOUS | Status: DC
Start: 2015-07-22 — End: 2015-07-26
  Administered 2015-07-22 – 2015-07-26 (×5): via INTRAVENOUS

## 2015-07-22 MED ORDER — HYDROCODONE-ACETAMINOPHEN 5-325 MG PO TABS: 1.0000 | ORAL_TABLET | Freq: Four times a day (QID) | ORAL | Status: DC | PRN

## 2015-07-22 MED ORDER — LATANOPROST 0.005 % OP SOLN
1.0000 [drp] | Freq: Every day | OPHTHALMIC | Status: DC
  Administered 2015-07-23 – 2015-07-25 (×4): 1 [drp] via OPHTHALMIC
  Filled 2015-07-22: qty 2.5

## 2015-07-22 MED ORDER — LORATADINE 10 MG PO TABS
10.0000 mg | ORAL_TABLET | Freq: Every day | ORAL | Status: DC
  Administered 2015-07-23 – 2015-07-26 (×4): 10 mg via ORAL
  Filled 2015-07-22 (×4): qty 1

## 2015-07-22 NOTE — Progress Notes (Signed)
ANTIBIOTIC CONSULT NOTE-Preliminary  Pharmacy Consult for Levaquin Indication: UTI  No Known Allergies  Patient Measurements: Height: 5\' 4"  (162.6 cm) Weight: 120 lb (54.432 kg) IBW/kg (Calculated) : 54.7  Vital Signs: Temp: 99.7 F (37.6 C) (03/09 1236) Temp Source: Rectal (03/09 1236) BP: 119/70 mmHg (03/09 2151) Pulse Rate: 87 (03/09 2151)  Labs:  Recent Labs  07/22/15 1220  WBC 16.9*  HGB 9.9*  PLT 532*  CREATININE 1.13*    Estimated Creatinine Clearance: 29 mL/min (by C-G formula based on Cr of 1.13).  No results for input(s): VANCOTROUGH, VANCOPEAK, VANCORANDOM, GENTTROUGH, GENTPEAK, GENTRANDOM, TOBRATROUGH, TOBRAPEAK, TOBRARND, AMIKACINPEAK, AMIKACINTROU, AMIKACIN in the last 72 hours.   Microbiology: Recent Results (from the past 720 hour(s))  MRSA PCR Screening     Status: Abnormal   Collection Time: 07/12/15 11:10 PM  Result Value Ref Range Status   MRSA by PCR POSITIVE (A) NEGATIVE Final    Comment:        The GeneXpert MRSA Assay (FDA approved for NASAL specimens only), is one component of a comprehensive MRSA colonization surveillance program. It is not intended to diagnose MRSA infection nor to guide or monitor treatment for MRSA infections. RESULT CALLED TO, READ BACK BY AND VERIFIED WITH: WILSON A AT 0227 ON [id number] BY FORSYTH K     Medical History: Past Medical History  Diagnosis Date  . Hypertension   . Arthritis   . Shortness of breath   . Stroke (HCC)   . GERD (gastroesophageal reflux disease)   . Dysrhythmia   . Pneumonia   . Depression   . Anemia   . Dementia with behavioral disturbance 04/10/2013  . Acute renal failure (HCC) 04/10/2013  . A-fib (HCC)   . Failure to thrive in adult   . Orthostatic hypotension   . Depression   . Fall     Medications:  Prescriptions prior to admission  Medication Sig Dispense Refill Last Dose  . acetaminophen (TYLENOL) 500 MG tablet Take 1,000 mg by mouth every 6 (six) hours as needed  for mild pain or moderate pain.    unknown  . anti-nausea (EMETROL) solution Take 10 mLs by mouth every 15 (fifteen) minutes as needed for nausea or vomiting.   unknown  . aspirin 325 MG tablet Take 1 tablet (325 mg total) by mouth daily. 30 tablet 12 07/22/2015 at Unknown time  . busPIRone (BUSPAR) 5 MG tablet Take 2.5 mg by mouth 2 (two) times daily.    07/22/2015 at Unknown time  . calcium-vitamin D (OSCAL WITH D) 500-200 MG-UNIT per tablet Take 1 tablet by mouth 2 (two) times daily.   07/22/2015 at Unknown time  . cholecalciferol (VITAMIN D) 1000 UNITS tablet Take 1,000 Units by mouth daily.   07/22/2015 at Unknown time  . citalopram (CELEXA) 20 MG tablet Take 20 mg by mouth daily.   07/22/2015 at Unknown time  . diltiazem (CARDIZEM CD) 120 MG 24 hr capsule Take 1 capsule (120 mg total) by mouth daily.   07/22/2015 at Unknown time  . diphenhydrAMINE (BENADRYL) 25 MG tablet Take 25 mg by mouth every 4 (four) hours as needed for allergies.   unknown  . donepezil (ARICEPT) 10 MG tablet Take 10 mg by mouth at bedtime.   07/21/2015 at Unknown time  . fluticasone (FLONASE) 50 MCG/ACT nasal spray Place 2 sprays into both nostrils daily.   07/22/2015 at Unknown time  . HYDROcodone-acetaminophen (NORCO/VICODIN) 5-325 MG tablet Take 1 tablet by mouth every 6 (six) hours as  needed for moderate pain.   unknown  . latanoprost (XALATAN) 0.005 % ophthalmic solution Place 1 drop into the right eye at bedtime.   07/21/2015 at Unknown time  . loratadine (CLARITIN) 10 MG tablet Take 10 mg by mouth daily.   07/22/2015 at Unknown time  . Multiple Vitamin (MULTIVITAMIN WITH MINERALS) TABS tablet Take 1 tablet by mouth daily.   07/22/2015 at Unknown time  . Omega 3 1000 MG CAPS Take 1 capsule by mouth 2 (two) times daily.   07/22/2015 at Unknown time  . omeprazole (PRILOSEC) 20 MG capsule Take 20 mg by mouth daily.   07/22/2015 at Unknown time  . vitamin B-12 (CYANOCOBALAMIN) 1000 MCG tablet Take 1,000 mcg by mouth daily.   07/22/2015 at Unknown  time    Assessment: Okay for Protocol,  Goal of Therapy:  Eradicate infection.   Plan:  Preliminary review of pertinent patient information completed.  Protocol will be initiated with a one-time dose(s) of  IV Levaquin followed by  PO every 48 hours.  Jeani Hawking clinical pharmacist will complete review during morning rounds to assess patient and finalize treatment regimen.  Mady Gemma, [facility] 07/22/2015,10:27 PM

## 2015-07-22 NOTE — ED Notes (Signed)
Ems reports [facility] and was sent here for evaluation of pneumonia.  Pt alert.

## 2015-07-22 NOTE — ED Provider Notes (Signed)
CSN: [id number]     Arrival date & time 07/22/15  1209 History   First MD Initiated Contact with Patient 07/22/15 1300     Chief Complaint  Patient presents with  . Cough     (Consider location/radiation/quality/duration/timing/severity/associated sxs/prior Treatment) Patient is a 82 y.o. female presenting with cough. The history is provided by the patient and a relative (Patient has had a cough for couple weeks and has been calm much more week she can barely stand on her own she states that a assisted living).  Cough Cough characteristics:  Productive Severity:  Moderate Onset quality:  Sudden Timing:  Constant Progression:  Unable to specify Chronicity:  New Context: not animal exposure   Associated symptoms: wheezing   Associated symptoms: no chest pain, no eye discharge, no headaches and no rash     Past Medical History  Diagnosis Date  . Hypertension   . Arthritis   . Shortness of breath   . Stroke (HCC)   . GERD (gastroesophageal reflux disease)   . Dysrhythmia   . Pneumonia   . Depression   . Anemia   . Dementia with behavioral disturbance 04/10/2013  . Acute renal failure (HCC) 04/10/2013  . A-fib (HCC)   . Failure to thrive in adult   . Orthostatic hypotension   . Depression   . Fall    Past Surgical History  Procedure Laterality Date  . Back surgery    . Ovaries  removed   No family history on file. Social History  Substance Use Topics  . Smoking status: Never Smoker   . Smokeless tobacco: Never Used  . Alcohol Use: No   OB History    No data available     Review of Systems  Constitutional: Negative for appetite change and fatigue.  HENT: Negative for congestion, ear discharge and sinus pressure.   Eyes: Negative for discharge.  Respiratory: Positive for cough and wheezing.   Cardiovascular: Negative for chest pain.  Gastrointestinal: Negative for abdominal pain and diarrhea.  Genitourinary: Negative for frequency and hematuria.   Musculoskeletal: Negative for back pain.  Skin: Negative for rash.  Neurological: Negative for seizures and headaches.  Psychiatric/Behavioral: Negative for hallucinations.      Allergies  Review of patient's allergies indicates no known allergies.  Home Medications   Prior to Admission medications   Medication Sig Start Date End Date Taking? Authorizing Provider  acetaminophen (TYLENOL) 500 MG tablet Take 1,000 mg by mouth every 6 (six) hours as needed for mild pain or moderate pain.    Yes Historical Provider, MD  anti-nausea (EMETROL) solution Take 10 mLs by mouth every 15 (fifteen) minutes as needed for nausea or vomiting.   Yes Historical Provider, MD  aspirin 325 MG tablet Take 1 tablet (325 mg total) by mouth daily. 07/14/15  Yes Kari Baars, MD  busPIRone (BUSPAR) 5 MG tablet Take 2.5 mg by mouth 2 (two) times daily.    Yes Historical Provider, MD  calcium-vitamin D (OSCAL WITH D) 500-200 MG-UNIT per tablet Take 1 tablet by mouth 2 (two) times daily.   Yes Historical Provider, MD  cholecalciferol (VITAMIN D) 1000 UNITS tablet Take 1,000 Units by mouth daily.   Yes Historical Provider, MD  citalopram (CELEXA) 20 MG tablet Take 20 mg by mouth daily. 03/04/13  Yes Historical Provider, MD  diltiazem (CARDIZEM CD) 120 MG 24 hr capsule Take 1 capsule (120 mg total) by mouth daily. 04/10/13  Yes Kari Baars, MD  diphenhydrAMINE (BENADRYL) 25 MG  tablet Take 25 mg by mouth every 4 (four) hours as needed for allergies.   Yes Historical Provider, MD  donepezil (ARICEPT) 10 MG tablet Take 10 mg by mouth at bedtime.   Yes Historical Provider, MD  fluticasone (FLONASE) 50 MCG/ACT nasal spray Place 2 sprays into both nostrils daily.   Yes Historical Provider, MD  HYDROcodone-acetaminophen (NORCO/VICODIN) 5-325 MG tablet Take 1 tablet by mouth every 6 (six) hours as needed for moderate pain.   Yes Historical Provider, MD  latanoprost (XALATAN) 0.005 % ophthalmic solution Place 1 drop into  the right eye at bedtime.   Yes Historical Provider, MD  loratadine (CLARITIN) 10 MG tablet Take 10 mg by mouth daily.   Yes Historical Provider, MD  Multiple Vitamin (MULTIVITAMIN WITH MINERALS) TABS tablet Take 1 tablet by mouth daily.   Yes Historical Provider, MD  Omega 3 1000 MG CAPS Take 1 capsule by mouth 2 (two) times daily.   Yes Historical Provider, MD  omeprazole (PRILOSEC) 20 MG capsule Take 20 mg by mouth daily.   Yes Historical Provider, MD  vitamin B-12 (CYANOCOBALAMIN) 1000 MCG tablet Take 1,000 mcg by mouth daily.   Yes Historical Provider, MD   BP 110/68 mmHg  Pulse 102  Temp(Src) 99.7 F (37.6 C) (Rectal)  Resp 16  SpO2 98% Physical Exam  Constitutional: She is oriented to person, place, and time. She appears well-developed.  HENT:  Head: Normocephalic.  Eyes: Conjunctivae and EOM are normal. No scleral icterus.  Neck: Neck supple. No thyromegaly present.  Cardiovascular: Normal rate and regular rhythm.  Exam reveals no gallop and no friction rub.   No murmur heard. Pulmonary/Chest: No stridor. She has wheezes. She has no rales. She exhibits no tenderness.  Abdominal: She exhibits no distension. There is no tenderness. There is no rebound.  Musculoskeletal: Normal range of motion. She exhibits no edema.  Lymphadenopathy:    She has no cervical adenopathy.  Neurological: She is oriented to person, place, and time. She exhibits normal muscle tone. Coordination normal.  Skin: No rash noted. No erythema.  Psychiatric: She has a normal mood and affect. Her behavior is normal.    ED Course  Procedures (including critical care time) Labs Review Labs Reviewed  CBC WITH DIFFERENTIAL/PLATELET - Abnormal; Notable for the following:    WBC 16.9 (*)    RBC 3.63 (*)    Hemoglobin 9.9 (*)    HCT 30.8 (*)    Platelets 532 (*)    Neutro Abs 13.7 (*)    Monocytes Absolute 1.9 (*)    All other components within normal limits  COMPREHENSIVE METABOLIC PANEL - Abnormal;  Notable for the following:    Glucose, Bld 199 (*)    BUN 33 (*)    Creatinine, Ser 1.13 (*)    Albumin 2.2 (*)    AST 64 (*)    GFR calc non Af Amer 42 (*)    GFR calc Af Amer 48 (*)    All other components within normal limits  BRAIN NATRIURETIC PEPTIDE - Abnormal; Notable for the following:    B Natriuretic Peptide 223.0 (*)    All other components within normal limits  URINALYSIS, ROUTINE W REFLEX MICROSCOPIC (NOT AT [facility]) - Abnormal; Notable for the following:    Leukocytes, UA TRACE (*)    All other components within normal limits  URINE MICROSCOPIC-ADD ON - Abnormal; Notable for the following:    Squamous Epithelial / LPF TOO NUMEROUS TO COUNT (*)  Bacteria, UA FEW (*)    All other components within normal limits  URINE CULTURE  I-STAT TROPOININ, ED    Imaging Review Dg Chest 2 View  07/22/2015  CLINICAL DATA:  Shortness of breath, back pain, atrial fibrillation. EXAM: CHEST  2 VIEW COMPARISON:  Chest x-ray dated 07/12/2015. FINDINGS: Mild cardiomegaly is stable. Overall cardiomediastinal silhouette is stable in size and configuration. Atherosclerotic changes again noted at the aortic arch. Lungs are clear.  Lung volumes are normal. Mild degenerative changes again seen throughout the kyphotic thoracolumbar spine. Mild compression deformities within the lower thoracic spine are stable. No acute- appearing osseous abnormality. IMPRESSION: Stable chest x-ray.  No acute findings. Electronically Signed   By: Bary Richard M.D.   On: 07/22/2015 14:10   I have personally reviewed and evaluated these images and lab results as part of my medical decision-making.   EKG Interpretation   Date/Time:  Thursday July 22 2015 12:14:27 EST Ventricular Rate:  102 PR Interval:    QRS Duration: 85 QT Interval:  357 QTC Calculation: 465 R Axis:   65 Text Interpretation:  Atrial fibrillation Borderline T abnormalities,  anterior leads Confirmed by Dalya Maselli  MD, Laqueta Bonaventura [phone]) on 07/22/2015  6:36:14  PM      MDM   Final diagnoses:  Bronchitis  Bronchospasm    Patient with elevated white count and persistent cough. No pneumonia seen on chest x-ray. Patient has bronchospasm and bronchitis with possible pneumonia she'll be admitted to medicine    Bethann Berkshire, MD 07/22/15 1925

## 2015-07-22 NOTE — ED Notes (Signed)
[facility] Lequita HaltMorgan[facility] and was told pt's home health RN assessed pt Tuesday and was told sounded like she had pneumonia. Staff reports pt's chest x ray was negative. TOday home health rn came out and says lungs sounded worse so they sent her here for evaluation. Also reports pt's heart rate was irregular with a rate of 80-120. Also reports pt was diagnosed with mini stroke approx 1 week ago and reports pt has had decreased level of activity since then. ALso reports pt will yell out when moved.

## 2015-07-22 NOTE — H&P (Signed)
PCP:   Fredirick MaudlinHAWKINS,EDWARD L, MD   Chief Complaint:  Cough  HPI: 82 year old female who   has a past medical history of Hypertension; Arthritis; Shortness of breath; Stroke [facility](HCC); GERD (gastroesophageal reflux disease); Dysrhythmia; Pneumonia; Depression; Anemia; Dementia with behavioral disturbance (04/10/2013); Acute renal failure (HCC) (04/10/2013); A-fib (HCC); Failure to thrive in adult; Orthostatic hypotension; Depression; and Fall. Today was brought to the hospital from assisted living for complaints of cough for past 1 week. Patient denies chest pain or shortness of breath. She denies of coughing up phlegm. Denies nausea vomiting or diarrhea. In the ED found to have mildly abnormal UA, with WBC of 16,000.   Allergies:  No Known Allergies    Past Medical History  Diagnosis Date  . Hypertension   . Arthritis   . Shortness of breath   . Stroke (HCC)   . GERD (gastroesophageal reflux disease)   . Dysrhythmia   . Pneumonia   . Depression   . Anemia   . Dementia with behavioral disturbance 04/10/2013  . Acute renal failure (HCC) 04/10/2013  . A-fib (HCC)   . Failure to thrive in adult   . Orthostatic hypotension   . Depression   . Fall     Past Surgical History  Procedure Laterality Date  . Back surgery    . Ovaries  removed    Prior to Admission medications   Medication Sig Start Date End Date Taking? Authorizing Provider  acetaminophen (TYLENOL) 500 MG tablet Take 1,000 mg by mouth every 6 (six) hours as needed for mild pain or moderate pain.    Yes Historical Provider, MD  anti-nausea (EMETROL) solution Take 10 mLs by mouth every 15 (fifteen) minutes as needed for nausea or vomiting.   Yes Historical Provider, MD  aspirin 325 MG tablet Take 1 tablet (325 mg total) by mouth daily. 07/14/15  Yes Kari BaarsEdward Hawkins, MD  busPIRone (BUSPAR) 5 MG tablet Take 2.5 mg by mouth 2 (two) times daily.    Yes Historical Provider, MD  calcium-vitamin D (OSCAL WITH D) 500-200 MG-UNIT  per tablet Take 1 tablet by mouth 2 (two) times daily.   Yes Historical Provider, MD  cholecalciferol (VITAMIN D) 1000 UNITS tablet Take 1,000 Units by mouth daily.   Yes Historical Provider, MD  citalopram (CELEXA) 20 MG tablet Take 20 mg by mouth daily. 03/04/13  Yes Historical Provider, MD  diltiazem (CARDIZEM CD) 120 MG 24 hr capsule Take 1 capsule (120 mg total) by mouth daily. 04/10/13  Yes Kari BaarsEdward Hawkins, MD  diphenhydrAMINE (BENADRYL) 25 MG tablet Take 25 mg by mouth every 4 (four) hours as needed for allergies.   Yes Historical Provider, MD  donepezil (ARICEPT) 10 MG tablet Take 10 mg by mouth at bedtime.   Yes Historical Provider, MD  fluticasone (FLONASE) 50 MCG/ACT nasal spray Place 2 sprays into both nostrils daily.   Yes Historical Provider, MD  HYDROcodone-acetaminophen (NORCO/VICODIN) 5-325 MG tablet Take 1 tablet by mouth every 6 (six) hours as needed for moderate pain.   Yes Historical Provider, MD  latanoprost (XALATAN) 0.005 % ophthalmic solution Place 1 drop into the right eye at bedtime.   Yes Historical Provider, MD  loratadine (CLARITIN) 10 MG tablet Take 10 mg by mouth daily.   Yes Historical Provider, MD  Multiple Vitamin (MULTIVITAMIN WITH MINERALS) TABS tablet Take 1 tablet by mouth daily.   Yes Historical Provider, MD  Omega 3 1000 MG CAPS Take 1 capsule by mouth 2 (two) times daily.  Yes Historical Provider, MD  omeprazole (PRILOSEC) 20 MG capsule Take 20 mg by mouth daily.   Yes Historical Provider, MD  vitamin B-12 (CYANOCOBALAMIN) 1000 MCG tablet Take 1,000 mcg by mouth daily.   Yes Historical Provider, MD    Social History:  reports that she has never smoked. She has never used smokeless tobacco. She reports that she does not drink alcohol or use illicit drugs.   All the positives are listed in BOLD  Review of Systems:  HEENT: Headache, blurred vision, runny nose, sore throat Neck: Hypothyroidism, hyperthyroidism,,lymphadenopathy Chest : Shortness of  breath, history of COPD, Asthma Heart : Chest pain, history of coronary arterey disease GI:  Nausea, vomiting, diarrhea, constipation, GERD GU: Dysuria, urgency, frequency of urination, hematuria Neuro: Stroke, seizures, syncope Psych: Depression, anxiety, hallucinations   Physical Exam: Blood pressure 130/73, pulse 111, temperature 99.7 F (37.6 C), temperature source Rectal, resp. rate 17, SpO2 97 %. Constitutional:   Patient is a well-developed and well-nourished female in no acute distress and cooperative with exam. Head: Normocephalic and atraumatic Mouth: Mucus membranes moist Eyes: PERRL, EOMI, conjunctivae normal Neck: Supple, No Thyromegaly Cardiovascular: RRR, S1 normal, S2 normal Pulmonary/Chest: Scattered wheezing bilaterally Abdominal: Soft. Non-tender, non-distended, bowel sounds are normal, no masses, organomegaly, or guarding present.  Neurological: A&O x3, Strength is normal and symmetric bilaterally, cranial nerve II-XII are grossly intact, no focal motor deficit, sensory intact to light touch bilaterally.  Extremities : No Cyanosis, Clubbing or Edema  Labs on Admission:  Basic Metabolic Panel:  Recent Labs Lab 07/22/15 1220  NA 139  K 3.6  CL 104  CO2 25  GLUCOSE 199*  BUN 33*  CREATININE 1.13*  CALCIUM 10.1   Liver Function Tests: CBC:  Recent Labs Lab 07/22/15 1220  WBC 16.9*  NEUTROABS 13.7*  HGB 9.9*  HCT 30.8*  MCV 84.8  PLT 532*   Cardiac Enzymes: No results for input(s): CKTOTAL, CKMB, CKMBINDEX, TROPONINI in the last 168 hours.  BNP (last 3 results)  Recent Labs  07/22/15 1220  BNP 223.0*     Radiological Exams on Admission: Dg Chest 2 View  07/22/2015  CLINICAL DATA:  Shortness of breath, back pain, atrial fibrillation. EXAM: CHEST  2 VIEW COMPARISON:  Chest x-ray dated 07/12/2015. FINDINGS: Mild cardiomegaly is stable. Overall cardiomediastinal silhouette is stable in size and configuration. Atherosclerotic changes again  noted at the aortic arch. Lungs are clear.  Lung volumes are normal. Mild degenerative changes again seen throughout the kyphotic thoracolumbar spine. Mild compression deformities within the lower thoracic spine are stable. No acute- appearing osseous abnormality. IMPRESSION: Stable chest x-ray.  No acute findings. Electronically Signed   By: Bary Richard M.D.   On: 07/22/2015 14:10    EKG: Independently reviewed. Atrial fibrillation   Assessment/Plan Active Problems:   Acute bronchitis   Bronchospasm    ? UTI    Atrial fibrillation   Acute bronchitis Admit the patient with acute bronchitis, start duo nebs every 6 hours. Levaquin per pharmacy consultation. Mucinex one tablet by mouth twice a day  Questionable UTI Patient has abnormal UA, will start Levaquin per pharmacy consultation. Follow urine culture.  Atrial fibrillation Heart rate is controlled, continue Cardizem, aspirin  DVT prophylaxis Lovenox   Code status: Full code  Family discussion: Admission, patients condition and plan of care including tests being ordered have been discussed with the patient and her daughter at bedside* who indicate understanding and agree with the plan and Code Status.   Time Spent  on Admission: 60 min  Azell Bill S [facility] Pager: [phone] 07/22/2015, 8:28 PM  If 7PM-7AM, please contact night-coverage  [email or web address]  Password [email or web address]

## 2015-07-23 DIAGNOSIS — I482 Chronic atrial fibrillation, unspecified: Secondary | ICD-10-CM | POA: Insufficient documentation

## 2015-07-23 DIAGNOSIS — J209 Acute bronchitis, unspecified: Secondary | ICD-10-CM | POA: Diagnosis not present

## 2015-07-23 DIAGNOSIS — N390 Urinary tract infection, site not specified: Secondary | ICD-10-CM | POA: Diagnosis not present

## 2015-07-23 LAB — COMPREHENSIVE METABOLIC PANEL
ALBUMIN: 2 g/dL — AB (ref 3.5–5.0)
ALK PHOS: 81 U/L (ref 38–126)
ALT: 35 U/L (ref 14–54)
AST: 38 U/L (ref 15–41)
Anion gap: 11 (ref 5–15)
BILIRUBIN TOTAL: 0.7 mg/dL (ref 0.3–1.2)
BUN: 31 mg/dL — AB (ref 6–20)
CO2: 25 mmol/L (ref 22–32)
Calcium: 9.8 mg/dL (ref 8.9–10.3)
Chloride: 106 mmol/L (ref 101–111)
Creatinine, Ser: 1.09 mg/dL — ABNORMAL HIGH (ref 0.44–1.00)
GFR calc Af Amer: 51 mL/min — ABNORMAL LOW (ref >60)
GFR calc non Af Amer: 44 mL/min — ABNORMAL LOW (ref >60)
GLUCOSE: 87 mg/dL (ref 65–99)
Potassium: 4 mmol/L (ref 3.5–5.1)
Sodium: 142 mmol/L (ref 135–145)
Total Protein: 6.2 g/dL — ABNORMAL LOW (ref 6.5–8.1)

## 2015-07-23 LAB — CBC
HEMATOCRIT: 27.3 % — AB (ref 36.0–46.0)
HEMOGLOBIN: 9 g/dL — AB (ref 12.0–15.0)
MCH: 28.4 pg (ref 26.0–34.0)
MCHC: 33 g/dL (ref 30.0–36.0)
MCV: 86.1 fL (ref 78.0–100.0)
Platelets: 454 10*3/uL — ABNORMAL HIGH (ref 150–400)
RBC: 3.17 MIL/uL — ABNORMAL LOW (ref 3.87–5.11)
RDW: 13.5 % (ref 11.5–15.5)
WBC: 12 10*3/uL — AB (ref 4.0–10.5)

## 2015-07-23 LAB — MRSA PCR SCREENING: MRSA by PCR: POSITIVE — AB

## 2015-07-23 MED ORDER — PREDNISONE 20 MG PO TABS
40.0000 mg | ORAL_TABLET | Freq: Every day | ORAL | Status: DC
Start: 2015-07-24 — End: 2015-07-26
  Administered 2015-07-24 – 2015-07-26 (×3): 40 mg via ORAL
  Filled 2015-07-23 (×3): qty 2

## 2015-07-23 NOTE — Progress Notes (Signed)
Patient refused breathing treatment wanted to wait till tomorrow. Told her she could call if needed tonight. Patient has congest cough no wheezes saturation 97 room air.

## 2015-07-23 NOTE — Clinical Social Work Note (Signed)
CS[facility] at discharge. The facility states that they can admit the patient over the weekend as long as she has no medication changes. Facility states that weekend CSW can call the facility med tech over the weekend to determine if the facility can accept back. Instructions left for weekend CSW in handoff report.   Roddie McBryant Jaspal Pultz MSW, [place], [place], [phone]

## 2015-07-23 NOTE — Care Management Obs Status (Signed)
MEDICARE OBSERVATION STATUS NOTIFICATION   Patient Details  Name: Sheri Matthews MRN: [id number] Date of Birth: [birth year]   Medicare Observation Status Notification Given:  Yes    Adonis HugueninBerkhead, Steen Bisig L, RN 07/23/2015, 8:22 AM

## 2015-07-23 NOTE — Progress Notes (Signed)
[facility] PROGRESS NOTE  Sheri CarryLinda M Matthews ZOX:[id number] DOB: [birth year] DOA: 07/22/2015 PCP: Fredirick MaudlinHAWKINS,EDWARD L, MD  Assessment/Plan: 1. Acute bronchitis. The patient continues to cough and wheeze, we'll continue bronchodilators. Continue antibiotics. Will start the patient on a course of prednisone. 2. Urinary tract infection. Started on Levaquin. Follow-up urinary culture 3. Atrial fibrillation. Heart rate is controlled. Continue Cardizem, aspirin  Code Status: full code Family Communication: discussed with patient Disposition Plan: discharge when improved   Consultants:    Procedures:    Antibiotics:  levaquin 3/09>>  HPI/Subjective: Still coughing today, wheezing.  Objective: Filed Vitals:   07/23/15 0927 07/23/15 1359  BP: 136/78 111/66  Pulse:  91  Temp:  98.1 F (36.7 C)  Resp:  17    Intake/Output Summary (Last 24 hours) at 07/23/15 1900 Last data filed at 07/23/15 1700  Gross per 24 hour  Intake    360 ml  Output      0 ml  Net    360 ml   Filed Weights   07/22/15 2200  Weight: 54.432 kg (120 lb)    Exam:   General:  NAd  Cardiovascular: irregular  Respiratory: bilateral wheezing and rhonchi  Abdomen: soft, nt, nd, bs+  Musculoskeletal:  No edema b/l   Data Reviewed: Basic Metabolic Panel:  Recent Labs Lab 07/22/15 1220 07/23/15 0648  NA 139 142  K 3.6 4.0  CL 104 106  CO2 25 25  GLUCOSE 199* 87  BUN 33* 31*  CREATININE 1.13* 1.09*  CALCIUM 10.1 9.8   Liver Function Tests:  Recent Labs Lab 07/22/15 1220 07/23/15 0648  AST 64* 38  ALT 42 35  ALKPHOS 97 81  BILITOT 0.4 0.7  PROT 7.0 6.2*  ALBUMIN 2.2* 2.0*   No results for input(s): LIPASE, AMYLASE in the last 168 hours. No results for input(s): AMMONIA in the last 168 hours. CBC:  Recent Labs Lab 07/22/15 1220 07/23/15 0648  WBC 16.9* 12.0*  NEUTROABS 13.7*  --   HGB 9.9* 9.0*  HCT 30.8* 27.3*  MCV 84.8 86.1  PLT 532* 454*   Cardiac Enzymes: No  results for input(s): CKTOTAL, CKMB, CKMBINDEX, TROPONINI in the last 168 hours. BNP (last 3 results)  Recent Labs  07/22/15 1220  BNP 223.0*    ProBNP (last 3 results) No results for input(s): PROBNP in the last 8760 hours.  CBG: No results for input(s): GLUCAP in the last 168 hours.  Recent Results (from the past 240 hour(s))  MRSA PCR Screening     Status: Abnormal   Collection Time: 07/23/15  1:30 AM  Result Value Ref Range Status   MRSA by PCR POSITIVE (A) NEGATIVE Final    Comment:        The GeneXpert MRSA Assay (FDA approved for NASAL specimens only), is one component of a comprehensive MRSA colonization surveillance program. It is not intended to diagnose MRSA infection nor to guide or monitor treatment for MRSA infections. RESULT CALLED TO, READ BACK BY AND VERIFIED WITH: BULLOCK T AT 0329 ON [id number] BY FORSYTH K      Studies: Dg Chest 2 View  07/22/2015  CLINICAL DATA:  Shortness of breath, back pain, atrial fibrillation. EXAM: CHEST  2 VIEW COMPARISON:  Chest x-ray dated 07/12/2015. FINDINGS: Mild cardiomegaly is stable. Overall cardiomediastinal silhouette is stable in size and configuration. Atherosclerotic changes again noted at the aortic arch. Lungs are clear.  Lung volumes are normal. Mild degenerative changes again seen throughout the kyphotic thoracolumbar spine. Mild  compression deformities within the lower thoracic spine are stable. No acute- appearing osseous abnormality. IMPRESSION: Stable chest x-ray.  No acute findings. Electronically Signed   By: Bary Richard M.D.   On: 07/22/2015 14:10    Scheduled Meds: . aspirin  325 mg Oral Daily  . busPIRone  2.5 mg Oral BID  . citalopram  20 mg Oral Daily  . diltiazem  120 mg Oral Daily  . donepezil  10 mg Oral QHS  . enoxaparin (LOVENOX) injection  30 mg Subcutaneous Q24H  . guaiFENesin  600 mg Oral BID  . ipratropium-albuterol  3 mL Nebulization Q6H  . latanoprost  1 drop Right Eye QHS  . [START ON  07/24/2015] levofloxacin  500 mg Oral Q48H  . loratadine  10 mg Oral Daily   Continuous Infusions: . sodium chloride 50 mL/hr at 07/22/15 2348    Active Problems:   Acute bronchitis   Bronchospasm    Time spent:    Celest Reitz  [facility] Pager [phone]. If 7PM-7AM, please contact night-coverage at [email or web address], password [email or web address] 07/23/2015, 7:00 PM

## 2015-07-23 NOTE — Clinical Social Work Note (Signed)
Clinical Social Work Assessment  Patient Details  Name: Sheri Matthews MRN: [id number] Date of Birth: [birth year]  Date of referral:  07/23/15               Reason for consult:  Discharge Planning, Facility Placement                Permission sought to share information with:  Facility Sport and exercise psychologist, Family Supports Permission granted to share information::  Yes, Verbal Permission Granted  Name::     Sheri Matthews and Sheri Matthews::  [facility]  Relationship::     Contact Information:     Housing/Transportation Living arrangements for the past 2 months:  [place] of Information:  Patient, Other (Comment Required) (Granddaughter at bedside) Patient Interpreter Needed:  None Criminal Activity/Legal Involvement Pertinent to Current Situation/Hospitalization:  No - Comment as needed Significant Relationships:  Adult Children, Other Family Members Lives with:  Facility Resident Do you feel safe going back to the place where you live?  Yes Need for family participation in patient care:  Yes (Comment)  Care giving concerns:  The patient and granddaughter at bedside plan for the patient to return to [facility] at discharge.   Social Worker assessment / plan: CSW met with patient and patient's granddaughter at bedside to complete assessment. The patient and granddaughter confirm that the patient is from [facility] and the plan is for the patient to return to the facility at discharge. CSW explained role and process of getting the patient back to the facility at time of DC. CSW will assist.   Employment status:  Retired Forensic scientist:  Medicaid In [place], [place] PT Recommendations:  Not assessed at this time Information / Referral to community resources:  Other (Comment Required) (Information will be sent to [facility])   Patient/Family's Response to care:  The patient and granddaughter appear happy with the care the patient is  receiving.  Patient/Family's Understanding of and Emotional Response to Diagnosis, Current Treatment, and Prognosis:  The patient and granddaughter appear to have limited understanding of the reason for her admission, both state that they would like to speak with the nurse/MD regarding what is being treated. Both appear to have realistic expectations of the patient's post DC needs.  Emotional Assessment Appearance:  Appears stated age Attitude/Demeanor/Rapport:  Other (Patient is appropriate and welcoming of CSW.) Affect (typically observed):  Accepting, Calm, Pleasant, Appropriate Orientation:  Oriented to Self, Oriented to Place Alcohol / Substance use:  Not Applicable Psych involvement (Current and /or in the community):  No (Comment)  Discharge Needs  Concerns to be addressed:  Discharge Planning Concerns Readmission within the last 30 days:  Yes Current discharge risk:  Cognitively Impaired, Physical Impairment Barriers to Discharge:  Continued Medical Work up   Rigoberto Noel, LCSW 07/23/2015, 2:12 PM

## 2015-07-23 NOTE — NC FL2 (Signed)
[place] MEDICAID FL2 LEVEL OF CARE SCREENING TOOL     IDENTIFICATION  Patient Name: Sheri CarryLinda M Khamis Birthdate: 09-15-1923 Sex: female Admission Date (Current Location): 07/22/2015  [facility] [place]   Facility and Address:  [facility],  [address], [place] [id number]      Provider Number: [id number]  Attending Physician Name and Address:  Kari BaarsEdward Hawkins, MD  Relative Name and Phone Number:       Current Level of Care: Hospital Recommended Level of Care: Assisted Living Facility Prior Approval Number:    Date Approved/Denied:   PASRR Number:    Discharge Plan: Domiciliary (Rest home)    Current Diagnoses: Patient Active Problem List   Diagnosis Date Noted  . Bronchospasm 07/22/2015  . Cerebellar infarct (HCC) 07/12/2015  . Gait abnormality 07/12/2015  . General weakness 07/12/2015  . Acute bronchitis 07/12/2015  . Falls 07/12/2015  . CVA (cerebral infarction) 07/12/2015  . Dementia with behavioral disturbance 04/10/2013  . Failure to thrive in adult 04/10/2013  . Distal radial fracture 04/10/2013  . Atrial fibrillation (HCC) 04/10/2013  . Dehydration 04/10/2013  . Acute renal failure (HCC) 04/10/2013  . Multiple falls 04/10/2013  . Orthostatic hypotension 04/10/2013  . Atrial fibrillation with rapid ventricular response (HCC) 07/05/2012  . Essential hypertension, benign 07/05/2012  . Left-sided chest wall pain 07/05/2012  . ANEMIA 01/19/2010  . PUD 01/19/2010  . DIARRHEA 01/19/2010    Orientation RESPIRATION BLADDER Height & Weight     Self  Normal Incontinent Weight: 54.432 kg (120 lb) Height:  5\' 4"  (162.6 cm)  BEHAVIORAL SYMPTOMS/MOOD NEUROLOGICAL BOWEL NUTRITION STATUS   (NONE)  (NONE) Continent Diet (Heart Healthy)  AMBULATORY STATUS COMMUNICATION OF NEEDS Skin   Limited Assist Verbally Normal                       Personal Care Assistance Level of Assistance  Bathing, Feeding, Dressing Bathing  Assistance: Limited assistance Feeding assistance: Independent Dressing Assistance: Limited assistance     Functional Limitations Info  Sight, Hearing, Speech Sight Info: Adequate Hearing Info: Adequate Speech Info: Adequate    SPECIAL CARE FACTORS FREQUENCY  PT (By licensed PT)     PT Frequency: 3/week              Contractures Contractures Info: Not present    Additional Factors Info  Code Status, Allergies, Isolation Precautions Code Status Info: Full Allergies Info: NKDA     Isolation Precautions Info: Contact isolation for MRSA by pcr.     Current Medications (07/23/2015):  This is the current hospital active medication list Current Facility-Administered Medications  Medication Dose Route Frequency Provider Last Rate Last Dose  . 0.9 %  sodium chloride infusion   Intravenous Continuous Meredeth IdeGagan S Lama, MD 50 mL/hr at 07/22/15 2348    . aspirin tablet 325 mg  325 mg Oral Daily Meredeth IdeGagan S Lama, MD   325 mg at 07/23/15 [id number]  . busPIRone (BUSPAR) tablet 2.5 mg  2.5 mg Oral BID Meredeth IdeGagan S Lama, MD   2.5 mg at 07/23/15 0928  . citalopram (CELEXA) tablet 20 mg  20 mg Oral Daily Meredeth IdeGagan S Lama, MD   20 mg at 07/23/15 0928  . diltiazem (CARDIZEM CD) 24 hr capsule 120 mg  120 mg Oral Daily Meredeth IdeGagan S Lama, MD   120 mg at 07/23/15 0927  . donepezil (ARICEPT) tablet 10 mg  10 mg Oral QHS Meredeth IdeGagan S Lama, MD  10 mg at 07/22/15 2349  . enoxaparin (LOVENOX) injection 30 mg  30 mg Subcutaneous Q24H Meredeth Ide, MD   30 mg at 07/22/15 2352  . guaiFENesin (MUCINEX) 12 hr tablet 600 mg  600 mg Oral BID Meredeth Ide, MD   600 mg at 07/23/15 4098  . HYDROcodone-acetaminophen (NORCO/VICODIN) 5-325 MG per tablet 1 tablet  1 tablet Oral Q6H PRN Meredeth Ide, MD      . ipratropium-albuterol (DUONEB) 0.5-2.5 (3) MG/3ML nebulizer solution 3 mL  3 mL Nebulization Q6H Meredeth Ide, MD   3 mL at 07/23/15 1400  . latanoprost (XALATAN) 0.005 % ophthalmic solution 1 drop  1 drop Right Eye QHS Meredeth Ide, MD   1  drop at 07/23/15 0009  . [START ON 07/24/2015] levofloxacin (LEVAQUIN) tablet 500 mg  500 mg Oral Q48H Meredeth Ide, MD      . loratadine (CLARITIN) tablet 10 mg  10 mg Oral Daily Meredeth Ide, MD   10 mg at 07/23/15 0928  . ondansetron (ZOFRAN) tablet 4 mg  4 mg Oral Q6H PRN Meredeth Ide, MD       Or  . ondansetron (ZOFRAN) injection 4 mg  4 mg Intravenous Q6H PRN Meredeth Ide, MD         Discharge Medications: Please see discharge summary for a list of discharge medications.  Relevant Imaging Results:  Relevant Lab Results:   Additional Information Resume any PT/OT services. Patient and family also want to know if [facility]F can get the patient a hospital bed.   Venita Lick, LCSW

## 2015-07-24 MED ORDER — IPRATROPIUM-ALBUTEROL 0.5-2.5 (3) MG/3ML IN SOLN: 3.0000 mL | Freq: Four times a day (QID) | RESPIRATORY_TRACT | Status: DC | PRN

## 2015-07-24 NOTE — Progress Notes (Signed)
Subjective: She was admitted with bronchospasm. She has dementia and is confused. She has no other new complaints. I think she has probably aspirated.  Objective: Vital signs in last 24 hours: Temp:  [98.1 F (36.7 C)-99 F (37.2 C)] 99 F (37.2 C) (03/11 1146) Pulse Rate:  [78-108] 107 (03/11 0823) Resp:  [16-20] 20 (03/11 0823) BP: (111-121)/(66-77) 121/77 mmHg (03/11 0632) SpO2:  [96 %-97 %] 96 % (03/11 0823) Weight change:  Last BM Date: 07/23/15  Intake/Output from previous day: 03/10 0701 - 03/11 0700 In: 360 [P.O.:360] Out: -   PHYSICAL EXAM General appearance: alert and Confused Resp: wheezes bilaterally Cardio: irregularly irregular rhythm GI: soft, non-tender; bowel sounds normal; no masses,  no organomegaly Extremities: extremities normal, atraumatic, no cyanosis or edema  Lab Results:  Results for orders placed or performed during the hospital encounter of 07/22/15 (from the past 48 hour(s))  CBC with Differential/Platelet     Status: Abnormal   Collection Time: 07/22/15 12:20 PM  Result Value Ref Range   WBC 16.9 (H) 4.0 - 10.5 K/uL   RBC 3.63 (L) 3.87 - 5.11 MIL/uL   Hemoglobin 9.9 (L) 12.0 - 15.0 g/dL   HCT 43.1 (L) 42.7 - 67.0 %   MCV 84.8 78.0 - 100.0 fL   MCH 27.3 26.0 - 34.0 pg   MCHC 32.1 30.0 - 36.0 g/dL   RDW 11.0 03.4 - 96.1 %   Platelets 532 (H) 150 - 400 K/uL   Neutrophils Relative % 81 %   Neutro Abs 13.7 (H) 1.7 - 7.7 K/uL   Lymphocytes Relative 8 %   Lymphs Abs 1.3 0.7 - 4.0 K/uL   Monocytes Relative 11 %   Monocytes Absolute 1.9 (H) 0.1 - 1.0 K/uL   Eosinophils Relative 0 %   Eosinophils Absolute 0.0 0.0 - 0.7 K/uL   Basophils Relative 0 %   Basophils Absolute 0.0 0.0 - 0.1 K/uL  Comprehensive metabolic panel     Status: Abnormal   Collection Time: 07/22/15 12:20 PM  Result Value Ref Range   Sodium 139 135 - 145 mmol/L   Potassium 3.6 3.5 - 5.1 mmol/L   Chloride 104 101 - 111 mmol/L   CO2 25 22 - 32 mmol/L   Glucose, Bld 199 (H)  65 - 99 mg/dL   BUN 33 (H) 6 - 20 mg/dL   Creatinine, Ser 1.64 (H) 0.44 - 1.00 mg/dL   Calcium 35.3 8.9 - 91.2 mg/dL   Total Protein 7.0 6.5 - 8.1 g/dL   Albumin 2.2 (L) 3.5 - 5.0 g/dL   AST 64 (H) 15 - 41 U/L   ALT 42 14 - 54 U/L   Alkaline Phosphatase 97 38 - 126 U/L   Total Bilirubin 0.4 0.3 - 1.2 mg/dL   GFR calc non Af Amer 42 (L) >60 mL/min   GFR calc Af Amer 48 (L) >60 mL/min    Comment: (NOTE) The eGFR has been calculated using the CKD EPI equation. This calculation has not been validated in all clinical situations. eGFR's persistently <60 mL/min signify possible Chronic Kidney Disease.    Anion gap 10 5 - 15  Brain natriuretic peptide     Status: Abnormal   Collection Time: 07/22/15 12:20 PM  Result Value Ref Range   B Natriuretic Peptide 223.0 (H) 0.0 - 100.0 pg/mL  I-stat troponin, ED     Status: None   Collection Time: 07/22/15  1:29 PM  Result Value Ref Range   Troponin i, poc  0.01 0.00 - 0.08 ng/mL   Comment 3            Comment: Due to the release kinetics of cTnI, a negative result within the first hours of the onset of symptoms does not rule out myocardial infarction with certainty. If myocardial infarction is still suspected, repeat the test at appropriate intervals.   Urinalysis, Routine w reflex microscopic (not at [facility])     Status: Abnormal   Collection Time: 07/22/15  3:58 PM  Result Value Ref Range   Color, Urine YELLOW YELLOW   APPearance CLEAR CLEAR   Specific Gravity, Urine 1.015 1.005 - 1.030   pH 5.5 5.0 - 8.0   Glucose, UA NEGATIVE NEGATIVE mg/dL   Hgb urine dipstick NEGATIVE NEGATIVE   Bilirubin Urine NEGATIVE NEGATIVE   Ketones, ur NEGATIVE NEGATIVE mg/dL   Protein, ur NEGATIVE NEGATIVE mg/dL   Nitrite NEGATIVE NEGATIVE   Leukocytes, UA TRACE (A) NEGATIVE  Urine microscopic-add on     Status: Abnormal   Collection Time: 07/22/15  3:58 PM  Result Value Ref Range   Squamous Epithelial / LPF TOO NUMEROUS TO COUNT (A) NONE SEEN   WBC, UA  6-30 0 - 5 WBC/hpf   RBC / HPF 0-5 0 - 5 RBC/hpf   Bacteria, UA FEW (A) NONE SEEN  MRSA PCR Screening     Status: Abnormal   Collection Time: 07/23/15  1:30 AM  Result Value Ref Range   MRSA by PCR POSITIVE (A) NEGATIVE    Comment:        The GeneXpert MRSA Assay (FDA approved for NASAL specimens only), is one component of a comprehensive MRSA colonization surveillance program. It is not intended to diagnose MRSA infection nor to guide or monitor treatment for MRSA infections. RESULT CALLED TO, READ BACK BY AND VERIFIED WITH: BULLOCK T AT 0329 ON [id number] BY FORSYTH K   CBC     Status: Abnormal   Collection Time: 07/23/15  6:48 AM  Result Value Ref Range   WBC 12.0 (H) 4.0 - 10.5 K/uL   RBC 3.17 (L) 3.87 - 5.11 MIL/uL   Hemoglobin 9.0 (L) 12.0 - 15.0 g/dL   HCT 27.3 (L) 36.0 - 46.0 %   MCV 86.1 78.0 - 100.0 fL   MCH 28.4 26.0 - 34.0 pg   MCHC 33.0 30.0 - 36.0 g/dL   RDW 13.5 11.5 - 15.5 %   Platelets 454 (H) 150 - 400 K/uL  Comprehensive metabolic panel     Status: Abnormal   Collection Time: 07/23/15  6:48 AM  Result Value Ref Range   Sodium 142 135 - 145 mmol/L   Potassium 4.0 3.5 - 5.1 mmol/L   Chloride 106 101 - 111 mmol/L   CO2 25 22 - 32 mmol/L   Glucose, Bld 87 65 - 99 mg/dL   BUN 31 (H) 6 - 20 mg/dL   Creatinine, Ser 1.09 (H) 0.44 - 1.00 mg/dL   Calcium 9.8 8.9 - 10.3 mg/dL   Total Protein 6.2 (L) 6.5 - 8.1 g/dL   Albumin 2.0 (L) 3.5 - 5.0 g/dL   AST 38 15 - 41 U/L   ALT 35 14 - 54 U/L   Alkaline Phosphatase 81 38 - 126 U/L   Total Bilirubin 0.7 0.3 - 1.2 mg/dL   GFR calc non Af Amer 44 (L) >60 mL/min   GFR calc Af Amer 51 (L) >60 mL/min    Comment: (NOTE) The eGFR has been calculated using the CKD  EPI equation. This calculation has not been validated in all clinical situations. eGFR's persistently <60 mL/min signify possible Chronic Kidney Disease.    Anion gap 11 5 - 15    ABGS No results for input(s): PHART, PO2ART, TCO2, HCO3 in the last 72  hours.  Invalid input(s): PCO2 CULTURES Recent Results (from the past 240 hour(s))  MRSA PCR Screening     Status: Abnormal   Collection Time: 07/23/15  1:30 AM  Result Value Ref Range Status   MRSA by PCR POSITIVE (A) NEGATIVE Final    Comment:        The GeneXpert MRSA Assay (FDA approved for NASAL specimens only), is one component of a comprehensive MRSA colonization surveillance program. It is not intended to diagnose MRSA infection nor to guide or monitor treatment for MRSA infections. RESULT CALLED TO, READ BACK BY AND VERIFIED WITH: BULLOCK T AT 0329 ON [id number] BY FORSYTH K    Studies/Results: Dg Chest 2 View  07/22/2015  CLINICAL DATA:  Shortness of breath, back pain, atrial fibrillation. EXAM: CHEST  2 VIEW COMPARISON:  Chest x-ray dated 07/12/2015. FINDINGS: Mild cardiomegaly is stable. Overall cardiomediastinal silhouette is stable in size and configuration. Atherosclerotic changes again noted at the aortic arch. Lungs are clear.  Lung volumes are normal. Mild degenerative changes again seen throughout the kyphotic thoracolumbar spine. Mild compression deformities within the lower thoracic spine are stable. No acute- appearing osseous abnormality. IMPRESSION: Stable chest x-ray.  No acute findings. Electronically Signed   By: Franki Cabot M.D.   On: 07/22/2015 14:10    Medications:  Prior to Admission:  Prescriptions prior to admission  Medication Sig Dispense Refill Last Dose  . acetaminophen (TYLENOL) 500 MG tablet Take 1,000 mg by mouth every 6 (six) hours as needed for mild pain or moderate pain.    unknown  . anti-nausea (EMETROL) solution Take 10 mLs by mouth every 15 (fifteen) minutes as needed for nausea or vomiting.   unknown  . aspirin 325 MG tablet Take 1 tablet (325 mg total) by mouth daily. 30 tablet 12 07/22/2015 at Unknown time  . busPIRone (BUSPAR) 5 MG tablet Take 2.5 mg by mouth 2 (two) times daily.    07/22/2015 at Unknown time  . calcium-vitamin D (OSCAL  WITH D) 500-200 MG-UNIT per tablet Take 1 tablet by mouth 2 (two) times daily.   07/22/2015 at Unknown time  . cholecalciferol (VITAMIN D) 1000 UNITS tablet Take 1,000 Units by mouth daily.   07/22/2015 at Unknown time  . citalopram (CELEXA) 20 MG tablet Take 20 mg by mouth daily.   07/22/2015 at Unknown time  . diltiazem (CARDIZEM CD) 120 MG 24 hr capsule Take 1 capsule (120 mg total) by mouth daily.   07/22/2015 at Unknown time  . diphenhydrAMINE (BENADRYL) 25 MG tablet Take 25 mg by mouth every 4 (four) hours as needed for allergies.   unknown  . donepezil (ARICEPT) 10 MG tablet Take 10 mg by mouth at bedtime.   07/21/2015 at Unknown time  . fluticasone (FLONASE) 50 MCG/ACT nasal spray Place 2 sprays into both nostrils daily.   07/22/2015 at Unknown time  . HYDROcodone-acetaminophen (NORCO/VICODIN) 5-325 MG tablet Take 1 tablet by mouth every 6 (six) hours as needed for moderate pain.   unknown  . latanoprost (XALATAN) 0.005 % ophthalmic solution Place 1 drop into the right eye at bedtime.   07/21/2015 at Unknown time  . loratadine (CLARITIN) 10 MG tablet Take 10 mg by mouth daily.  07/22/2015 at Unknown time  . Multiple Vitamin (MULTIVITAMIN WITH MINERALS) TABS tablet Take 1 tablet by mouth daily.   07/22/2015 at Unknown time  . Omega 3 1000 MG CAPS Take 1 capsule by mouth 2 (two) times daily.   07/22/2015 at Unknown time  . omeprazole (PRILOSEC) 20 MG capsule Take 20 mg by mouth daily.   07/22/2015 at Unknown time  . vitamin B-12 (CYANOCOBALAMIN) 1000 MCG tablet Take 1,000 mcg by mouth daily.   07/22/2015 at Unknown time   Scheduled: . aspirin  325 mg Oral Daily  . busPIRone  2.5 mg Oral BID  . citalopram  20 mg Oral Daily  . diltiazem  120 mg Oral Daily  . donepezil  10 mg Oral QHS  . enoxaparin (LOVENOX) injection  30 mg Subcutaneous Q24H  . guaiFENesin  600 mg Oral BID  . latanoprost  1 drop Right Eye QHS  . levofloxacin  500 mg Oral Q48H  . loratadine  10 mg Oral Daily  . predniSONE  40 mg Oral Q  breakfast   Continuous: . sodium chloride 50 mL/hr at 07/23/15 2155   KDP:[id number], ipratropium-albuterol, ondansetron **OR** ondansetron (ZOFRAN) IV  Assesment: She was admitted with acute bronchitis with bronchospasm and I think she may have aspirated. She does not show pneumonia on chest x-ray. She has a urinary tract infection and all this is being treated. She is now on prednisone and I think she is probably a little bit better. She has chronic atrial fibrillation and she's not felt to be a candidate for anticoagulation. She has dementia. There is no family available either here or by telephone. I would like to discuss CODE STATUS with them and make sure that they do want full measures. Active Problems:   Acute bronchitis   Bronchospasm   Urinary tract infectious disease   Chronic atrial fibrillation ([place])    Plan: Continue current treatments      Suezette Lafave L 07/24/2015, 9:31 AM

## 2015-07-25 DIAGNOSIS — F0390 Unspecified dementia without behavioral disturbance: Secondary | ICD-10-CM | POA: Diagnosis present

## 2015-07-25 DIAGNOSIS — N390 Urinary tract infection, site not specified: Secondary | ICD-10-CM | POA: Diagnosis present

## 2015-07-25 DIAGNOSIS — Z7982 Long term (current) use of aspirin: Secondary | ICD-10-CM | POA: Diagnosis not present

## 2015-07-25 DIAGNOSIS — K219 Gastro-esophageal reflux disease without esophagitis: Secondary | ICD-10-CM | POA: Diagnosis present

## 2015-07-25 DIAGNOSIS — J209 Acute bronchitis, unspecified: Secondary | ICD-10-CM | POA: Diagnosis present

## 2015-07-25 DIAGNOSIS — Z8673 Personal history of transient ischemic attack (TIA), and cerebral infarction without residual deficits: Secondary | ICD-10-CM | POA: Diagnosis not present

## 2015-07-25 DIAGNOSIS — I10 Essential (primary) hypertension: Secondary | ICD-10-CM | POA: Diagnosis present

## 2015-07-25 DIAGNOSIS — I482 Chronic atrial fibrillation: Secondary | ICD-10-CM | POA: Diagnosis present

## 2015-07-25 DIAGNOSIS — J9801 Acute bronchospasm: Secondary | ICD-10-CM | POA: Diagnosis present

## 2015-07-25 LAB — URINE CULTURE: SPECIAL REQUESTS: NORMAL

## 2015-07-25 MED ORDER — DEXTROSE 5 % IV SOLN
1.0000 g | INTRAVENOUS | Status: DC
  Administered 2015-07-25 – 2015-07-26 (×2): 1 g via INTRAVENOUS
  Filled 2015-07-25 (×3): qty 10

## 2015-07-25 NOTE — Progress Notes (Signed)
Patient mostly refusing meals.  Did eat pudding with lunch, states she has a "sweet tooth".  Patient enjoys ensure and drinks them well, have been giving them with meals when not eating.

## 2015-07-25 NOTE — Progress Notes (Signed)
Pharmacy Antibiotic Note  Sheri Matthews is a 82 y.o. female admitted on 07/22/2015 with UTI.  Pharmacy has been consulted for levaquin dosing. Urine Culture results E.Coli- Resistant to cipro.s- ampicillin, rocephin, nitrofurantoin Pt still very weak, coughing , and congested. With acute bronchitis as well. D/W Dr. Felecia ShellingFanta and will change antibiotics to Rocephin. Plan: Rocephin 1gm IV q24h Transition to po abx as warranted.  Height: 5\' 4"  (162.6 cm) Weight: 120 lb (54.432 kg) IBW/kg (Calculated) : 54.7  Temp (24hrs), Avg:97.5 F (36.4 C), Min:97.4 F (36.3 C), Max:97.6 F (36.4 C)   Recent Labs Lab 07/22/15 1220 07/23/15 0648  WBC 16.9* 12.0*  CREATININE 1.13* 1.09*    Estimated Creatinine Clearance: 30 mL/min (by C-G formula based on Cr of 1.09).    No Known Allergies  Antimicrobials this admission: levaquin 3/9 >>3/12 Ceftriaxone 3/12 >>   Microbiology results: 3/9 UCx: E. Coli S- amp, rocephin, nitrofurantoin. R-cipro 3/9 MRSA PCR: positive  Thank you for allowing pharmacy to be a part of this patient's care.  Elder CyphersLorie [name], BS Pharm D, [place] Clinical Pharmacist Pager [phone]  07/25/2015 11:13 AM

## 2015-07-25 NOTE — Progress Notes (Signed)
Subjective: Patient is resting. She feels weak. She is still coughing and congested.  Objective: Vital signs in last 24 hours: Temp:  [97.4 F (36.3 C)-97.6 F (36.4 C)] 97.4 F (36.3 C) (03/12 0527) Pulse Rate:  [71-90] 79 (03/12 0527) Resp:  [20] 20 (03/12 0527) BP: (137-143)/(75-90) 142/77 mmHg (03/12 0527) SpO2:  [96 %-98 %] 97 % (03/12 0527) Weight change:  Last BM Date: 07/23/15  Intake/Output from previous day: 03/11 0701 - 03/12 0700 In: 600 [I.V.:600] Out: -   PHYSICAL EXAM General appearance: alert and slowed mentation Resp: diminished breath sounds bilaterally and rhonchi bilaterally Cardio: S1, S2 normal GI: soft, non-tender; bowel sounds normal; no masses,  no organomegaly Extremities: extremities normal, atraumatic, no cyanosis or edema  Lab Results:  No results found for this or any previous visit (from the past 48 hour(s)).  ABGS No results for input(s): PHART, PO2ART, TCO2, HCO3 in the last 72 hours.  Invalid input(s): PCO2 CULTURES Recent Results (from the past 240 hour(s))  Urine culture     Status: None (Preliminary result)   Collection Time: 07/22/15  3:58 PM  Result Value Ref Range Status   Specimen Description URINE, CLEAN CATCH  Final   Special Requests Normal  Final   Culture   Final    >=100,000 COLONIES/mL ESCHERICHIA COLI [facility]    Report Status PENDING  Incomplete  MRSA PCR Screening     Status: Abnormal   Collection Time: 07/23/15  1:30 AM  Result Value Ref Range Status   MRSA by PCR POSITIVE (A) NEGATIVE Final    Comment:        The GeneXpert MRSA Assay (FDA approved for NASAL specimens only), is one component of a comprehensive MRSA colonization surveillance program. It is not intended to diagnose MRSA infection nor to guide or monitor treatment for MRSA infections. RESULT CALLED TO, READ BACK BY AND VERIFIED WITH: BULLOCK T AT 0329 ON [id number] BY FORSYTH K    Studies/Results: No results  found.  Medications: I have reviewed the patient's current medications.  Assesment:   Active Problems:   Acute bronchitis   Bronchospasm   Urinary tract infectious disease   Chronic atrial fibrillation (HCC)    Plan: Medications reviewed Continue current treatment Supportive care      Mills Mitton 07/25/2015, 10:40 AM

## 2015-07-26 MED ORDER — CEFUROXIME AXETIL 250 MG PO TABS: 250.0000 mg | ORAL_TABLET | Freq: Two times a day (BID) | ORAL | Status: AC

## 2015-07-26 MED ORDER — PREDNISONE 10 MG PO TABS: 40.0000 mg | ORAL_TABLET | Freq: Every day | ORAL | Status: AC

## 2015-07-26 NOTE — Discharge Summary (Signed)
Physician Discharge Summary  Patient ID: Sheri Matthews MRN: [id number] DOB/AGE: 02/11/1924 82 y.o. Primary Care Physician:Zada Haser L, MD Admit date: 07/22/2015 Discharge date: 07/26/2015    Discharge Diagnoses:   Active Problems:   Acute bronchitis   Bronchospasm   Urinary tract infectious disease   Chronic atrial fibrillation (HCC)  Dementia   Medication List    TAKE these medications        acetaminophen 500 MG tablet  Commonly known as:  TYLENOL  Take 1,000 mg by mouth every 6 (six) hours as needed for mild pain or moderate pain.     anti-nausea solution  Take 10 mLs by mouth every 15 (fifteen) minutes as needed for nausea or vomiting.     aspirin 325 MG tablet  Take 1 tablet (325 mg total) by mouth daily.     busPIRone 5 MG tablet  Commonly known as:  BUSPAR  Take 2.5 mg by mouth 2 (two) times daily.     calcium-vitamin D 500-200 MG-UNIT tablet  Commonly known as:  OSCAL WITH D  Take 1 tablet by mouth 2 (two) times daily.     cefUROXime 250 MG tablet  Commonly known as:  CEFTIN  Take 1 tablet (250 mg total) by mouth 2 (two) times daily with a meal.     cholecalciferol 1000 units tablet  Commonly known as:  VITAMIN D  Take 1,000 Units by mouth daily.     citalopram 20 MG tablet  Commonly known as:  CELEXA  Take 20 mg by mouth daily.     diltiazem 120 MG 24 hr capsule  Commonly known as:  CARDIZEM CD  Take 1 capsule (120 mg total) by mouth daily.     diphenhydrAMINE 25 MG tablet  Commonly known as:  BENADRYL  Take 25 mg by mouth every 4 (four) hours as needed for allergies.     donepezil 10 MG tablet  Commonly known as:  ARICEPT  Take 10 mg by mouth at bedtime.     fluticasone 50 MCG/ACT nasal spray  Commonly known as:  FLONASE  Place 2 sprays into both nostrils daily.     HYDROcodone-acetaminophen 5-325 MG tablet  Commonly known as:  NORCO/VICODIN  Take 1 tablet by mouth every 6 (six) hours as needed for moderate pain.     latanoprost  0.005 % ophthalmic solution  Commonly known as:  XALATAN  Place 1 drop into the right eye at bedtime.     loratadine 10 MG tablet  Commonly known as:  CLARITIN  Take 10 mg by mouth daily.     multivitamin with minerals Tabs tablet  Take 1 tablet by mouth daily.     Omega 3 1000 MG Caps  Take 1 capsule by mouth 2 (two) times daily.     omeprazole 20 MG capsule  Commonly known as:  PRILOSEC  Take 20 mg by mouth daily.     predniSONE 10 MG tablet  Commonly known as:  DELTASONE  Take 4 tablets (40 mg total) by mouth daily with breakfast. Take 4 tablets for 3 days, 3 for 3 days, 2 for 3 days, 1 for 3 days and stop     vitamin B-12 1000 MCG tablet  Commonly known as:  CYANOCOBALAMIN  Take 1,000 mcg by mouth daily.        Discharged Condition: Improved    Consults: None  Significant Diagnostic Studies: Dg Chest 2 View  07/22/2015  CLINICAL DATA:  Shortness of breath, back pain, atrial  fibrillation. EXAM: CHEST  2 VIEW COMPARISON:  Chest x-ray dated 07/12/2015. FINDINGS: Mild cardiomegaly is stable. Overall cardiomediastinal silhouette is stable in size and configuration. Atherosclerotic changes again noted at the aortic arch. Lungs are clear.  Lung volumes are normal. Mild degenerative changes again seen throughout the kyphotic thoracolumbar spine. Mild compression deformities within the lower thoracic spine are stable. No acute- appearing osseous abnormality. IMPRESSION: Stable chest x-ray.  No acute findings. Electronically Signed   By: Bary Richard M.D.   On: 07/22/2015 14:10   Dg Chest 2 View  07/12/2015  CLINICAL DATA:  Cough.  Fell at nursing home. EXAM: CHEST  2 VIEW COMPARISON:  09/22/2014. FINDINGS: The cardiac silhouette, mediastinal and hilar contours are within normal limits and stable. There is tortuosity and calcification of the thoracic aorta. The lungs are clear. No pleural effusion. The bony thorax is intact. Stable T12 compression deformity. IMPRESSION: No acute  cardiopulmonary findings. Electronically Signed   By: Rudie Meyer M.D.   On: 07/12/2015 14:07   Ct Head Wo Contrast  07/12/2015  CLINICAL DATA:  Pt not eating well over the last 2 weeks; Pt has fallen several times recently and had an xray 2 weeks ago of left leg but was "inconclusive." EXAM: CT HEAD WITHOUT CONTRAST TECHNIQUE: Contiguous axial images were obtained from the base of the skull through the vertex without intravenous contrast. COMPARISON:  CT 04/07/2013 FINDINGS: No acute intracranial hemorrhage. No focal mass lesion. No CT evidence of acute infarction. No midline shift or mass effect. No hydrocephalus. Basilar cisterns are patent. There are periventricular and subcortical white matter hypodensities. Generalized cortical atrophy. Dense calcification in the LEFT parietal lobe associated with the dura is likely a benign meningioma and not changed from prior. Low-density lesions in the LEFT cerebellum consistent with remote infarctions. IMPRESSION: 1. Low-density lesions in the LEFT cerebellum are new from prior but have the appearance of remote infarctions. 2. No acute intracranial findings. 3. Atrophy and white matter microvascular disease. Electronically Signed   By: Genevive Bi M.D.   On: 07/12/2015 13:49   Mr Angiogram Head Wo Contrast  07/12/2015  CLINICAL DATA:  Altered mental status.  Dementia.  Acute infarct. EXAM: MRA HEAD WITHOUT CONTRAST TECHNIQUE: Angiographic images of the Circle of Willis were obtained using MRA technique without intravenous contrast. COMPARISON:  MRI brain from the same day. FINDINGS: The study is severely degraded by patient motion. The most superior slab is not diagnostic due to the motion. The internal carotid arteries are intact bilaterally through the ICA termini without significant focal stenosis. The vessels are poorly visualized beyond the proximal A1 or M1 segments. MCA and ACA branch vessels are not visualized. The vertebral arteries appear  codominant. The basilar artery is intact. The proximal PCAs are not well seen. IMPRESSION: 1. The proximal internal carotid arteries and vertebrobasilar system demonstrate no significant focal stenosis. 2. The more distal vessels cannot be assessed due to extreme patient motion. Electronically Signed   By: Marin Roberts M.D.   On: 07/12/2015 17:10   Mr Brain Wo Contrast  07/12/2015  CLINICAL DATA:  Difficulty walking.  Abnormal CT. EXAM: MRI HEAD WITHOUT CONTRAST TECHNIQUE: Multiplanar, multiecho pulse sequences of the brain and surrounding structures were obtained without intravenous contrast. COMPARISON:  CT head without contrast from the same day. MRI brain 08/27/2009. FINDINGS: Acute nonhemorrhagic infarct is present within the medial superior left cerebellum. The more lateral and inferior left cerebellar infarcts are remote. These were not present in 2011. An  additional right remote cerebellar lacunar infarct is present as well. Progressive diffuse atrophy and white matter disease is present. There multiple remote lacunar infarcts of the basal ganglia bilaterally. Most of these were present on the prior study. White matter changes extend into the brainstem with some progression since the prior exam. Flow is present in the major intracranial arteries. Bilateral lens replacements are noted. The globes and orbits are otherwise intact. Mild mucosal thickening is present in the anterior paranasal sinuses. Fluid levels are present in the sphenoid sinuses bilaterally. The mastoid air cells are clear. The skullbase is within normal limits. Midline sagittal images demonstrate atrophy but no focal lesion. IMPRESSION: 1. Acute 9 mm infarct in the posterior superior left cerebellum without associated hemorrhage. 2. Remote lacunar infarcts of the left cerebellum are otherwise remote. These are new since 2011 and likely new since the previous CT of 2014. 3. Progressive diffuse atrophy and white matter disease. 4.  Multiple bilateral remote lacunar infarcts of the basal ganglia. Electronically Signed   By: Marin Robertshristopher  Mattern M.D.   On: 07/12/2015 17:08   Dg Knee Complete 4 Views Left  07/12/2015  CLINICAL DATA:  Pain following fall EXAM: LEFT KNEE - COMPLETE 4+ VIEW COMPARISON:  April 02, 2011 FINDINGS: Frontal, lateral, and bilateral oblique views were obtained. There is no fracture or dislocation. There is a small joint effusion. There is mild generalized joint space narrowing. No erosive change. There is extensive chondrocalcinosis. IMPRESSION: Small joint effusion. Mild generalized osteoarthritis. No fracture or dislocation. Extensive chondrocalcinosis. Chondrocalcinosis may be seen with osteoarthritis but also may be seen with calcium pyrophosphate deposition disease which may present clinically as pseudogout. Electronically Signed   By: Bretta BangWilliam  Woodruff III M.D.   On: 07/12/2015 14:04   Dg Foot Complete Left  07/12/2015  CLINICAL DATA:  Left foot pain after fall. EXAM: LEFT FOOT - COMPLETE 3+ VIEW COMPARISON:  None. FINDINGS: There is no evidence of fracture or dislocation. Mild hallux valgus deformity of the first metatarsophalangeal joint is noted. Other joint spaces appear intact. Soft tissues are unremarkable. IMPRESSION: Mild hallux valgus deformity of the first metatarsophalangeal joint. No fracture or dislocation is noted. Electronically Signed   By: Lupita RaiderJames  Green Jr, M.D.   On: 07/12/2015 14:08   Dg Hip Unilat With Pelvis 2-3 Views Left  07/12/2015  CLINICAL DATA:  Initial encounter for complaining of a fall onset PTA. cough, and fallen several times-----previous x-ray performed of the left leg with "inconclusive results." She complains of left toes and left knee pain; she said she fell a year ago and has been having pain ever since. EXAM: DG HIP (WITH OR WITHOUT PELVIS) 2-3V LEFT COMPARISON:  None. FINDINGS: AP view of the pelvis and AP/frog leg views of the left hip. AP view of the pelvis is  degraded secondary to overlying soft tissues, obscuring the iliac crests. Mild osteopenia. Vascular calcifications. No acute fracture. Joint spaces are maintained for age. IMPRESSION: No acute osseous abnormality. Electronically Signed   By: Jeronimo GreavesKyle  Talbot M.D.   On: 07/12/2015 14:05    Lab Results: Basic Metabolic Panel: No results for input(s): NA, K, CL, CO2, GLUCOSE, BUN, CREATININE, CALCIUM, MG, PHOS in the last 72 hours. Liver Function Tests: No results for input(s): AST, ALT, ALKPHOS, BILITOT, PROT, ALBUMIN in the last 72 hours.   CBC: No results for input(s): WBC, NEUTROABS, HGB, HCT, MCV, PLT in the last 72 hours.  Recent Results (from the past 240 hour(s))  Urine culture  Status: None   Collection Time: 07/22/15  3:58 PM  Result Value Ref Range Status   Specimen Description URINE, CLEAN CATCH  Final   Special Requests Normal  Final   Culture   Final    >=100,000 COLONIES/mL ESCHERICHIA COLI Performed at [facility]    Report Status 07/25/2015 FINAL  Final   Organism ID, Bacteria ESCHERICHIA COLI  Final      Susceptibility   Escherichia coli - MIC*    AMPICILLIN 8 SENSITIVE Sensitive     CEFAZOLIN <=4 SENSITIVE Sensitive     CEFTRIAXONE <=1 SENSITIVE Sensitive     CIPROFLOXACIN >=4 RESISTANT Resistant     GENTAMICIN <=1 SENSITIVE Sensitive     IMIPENEM <=0.25 SENSITIVE Sensitive     NITROFURANTOIN <=16 SENSITIVE Sensitive     TRIMETH/SULFA >=320 RESISTANT Resistant     AMPICILLIN/SULBACTAM 4 SENSITIVE Sensitive     PIP/TAZO <=4 SENSITIVE Sensitive     * >=100,000 COLONIES/mL ESCHERICHIA COLI  MRSA PCR Screening     Status: Abnormal   Collection Time: 07/23/15  1:30 AM  Result Value Ref Range Status   MRSA by PCR POSITIVE (A) NEGATIVE Final    Comment:        The GeneXpert MRSA Assay (FDA approved for NASAL specimens only), is one component of a comprehensive MRSA colonization surveillance program. It is not intended to diagnose MRSA infection nor to  guide or monitor treatment for MRSA infections. RESULT CALLED TO, READ BACK BY AND VERIFIED WITH: BULLOCK T AT [id number] ON [id number] BY [facility] Course: This is an 82 year old who was sent from her nursing home for bronchospasm. She started having wheezing and cough. On exam she was found to be confused and wheezing with cough and congestion. She was started on antibiotics and inhaled bronchodilators and steroids and improved. She was back at baseline at the time of discharge. Her chest was clear.  Discharge Exam: Blood pressure 100/76, pulse 86, temperature 97.5 F (36.4 C), temperature source Oral, resp. rate 21, height  (1.626 m), weight 54.432 kg (120 lb), SpO2 99 %. She is confused. Her chest is clear. Her heart is irregular as always.  Disposition: Back to skilled care facility. She will be on a prednisone taper and she will take Ceftin 250 mg twice a day 5 days. She will continue all of her other medications.      Signed: Jennavie Martinek L   07/26/2015, 8:53 AM

## 2015-07-26 NOTE — Progress Notes (Signed)
She is awake and alert. She is confused as always. She is eating breakfast. Her chest is clear now. She looks comfortable. No respiratory distress. I think she is ready to go back to her facility and will plan to discharge today

## 2015-07-26 NOTE — Progress Notes (Signed)
Report called to [facility], given to MurphyMorgan.  Questions answered at this time.

## 2015-07-26 NOTE — Progress Notes (Addendum)
07/26/2015  Agreeable for patient to return to ALF per [facility]: Lequita HaltMorgan. Call placed to family and RN aware of DC.  Dorothea Ogleonna Attwood who is aware and agreeable with plan Patient to transport by EMS, confirmed with RN and family.  LCSW arranged for transport. RN aware of DC and agreeable.   Patient is from ALF: [facility]. Call placed to ALF, agreeable for return and all information faxed to facility through [facility]. Awaiting review of clinical documentation and will follow up with DC.  Family to be notified once all clinical information reviewed. Will set up transport as well.  Deretha EmoryHannah Conna Terada LCSW, MSW

## 2015-07-26 NOTE — Progress Notes (Signed)
Provided Dr Juanetta GoslingHawkins office with fax number to facility to send prescriptions to.  [phone]

## 2015-07-26 NOTE — Care Management Important Message (Signed)
Important Message  Patient Details  Name: Sheri CarryLinda M Matthews MRN: [id number] Date of Birth: 1923/10/03   Medicare Important Message Given:  Yes    Adonis HugueninBerkhead, Salvator Seppala L, RN 07/26/2015, 10:44 AM

## 2015-12-14 DEATH — deceased

## 2018-02-01 IMAGING — MR MR HEAD W/O CM
7 of 10 series · 29 of 48 positions shown · non-contrast
Comparison: CT head without contrast from the same day. MRI brain
08/27/2009.

CLINICAL DATA: Difficulty walking.  Abnormal CT.

EXAM:
MRI HEAD WITHOUT CONTRAST
TECHNIQUE: Multiplanar, multiecho pulse sequences of the brain and surrounding
structures were obtained without intravenous contrast.

[Series 3: [id number] · sagittal · 5.0mm · 0.44mm/px · 3 of 20 slices shown]
[im 1/20]
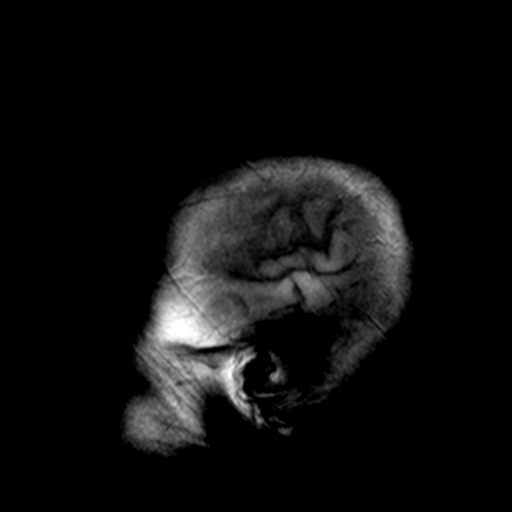
[im 10/20]
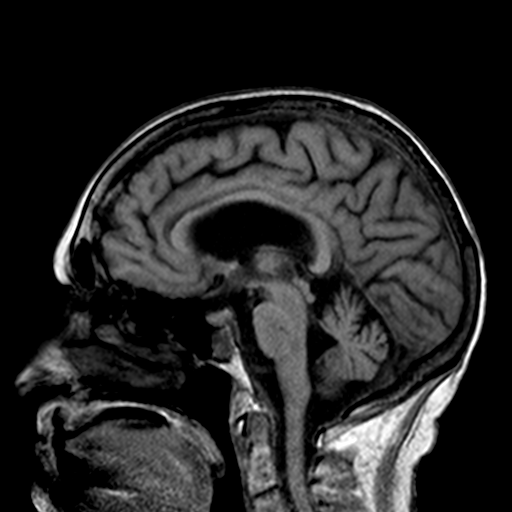
[im 20/20]
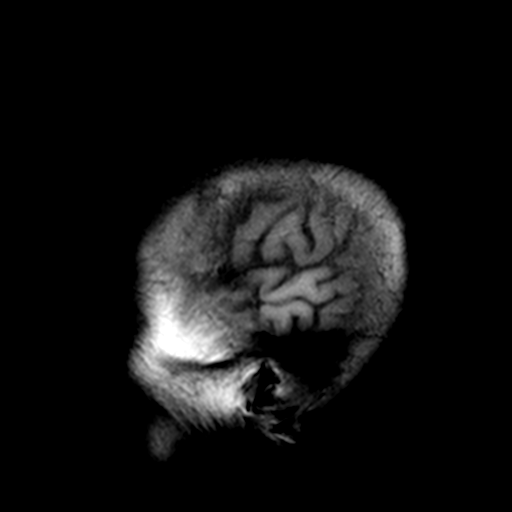

[Series 6: T2 · axial · 5.0mm · 0.75mm/px · z∈[-129,+12]mm · 3 of 23 slices shown (1 of 2)]
[im 1/23]
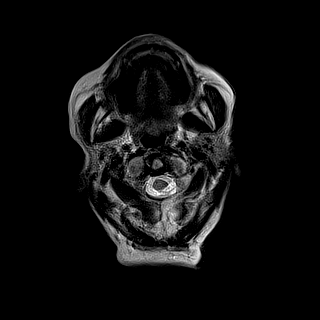
[im 12/23]
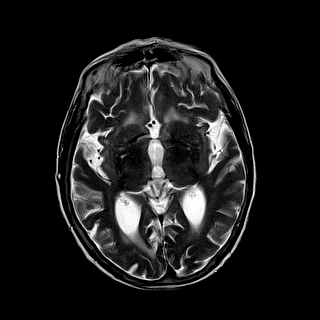
[im 23/23]
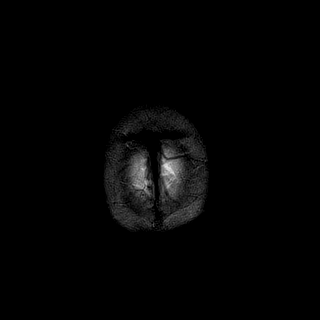

[Series 7: FLAIR · axial · 5.0mm · 0.94mm/px · z∈[-129,+12]mm · 3 of 23 slices shown]
[im 1/23]
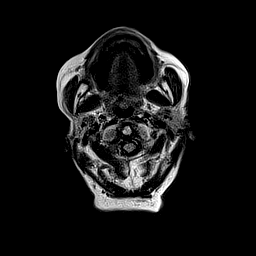
[im 12/23]
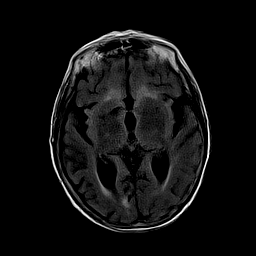
[im 23/23]
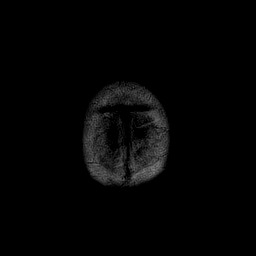

[Series 8: T1 · axial · 2.0mm · 0.43mm/px · z∈[-139,+46]mm · 11 of 95 slices shown]
[im 1/95]
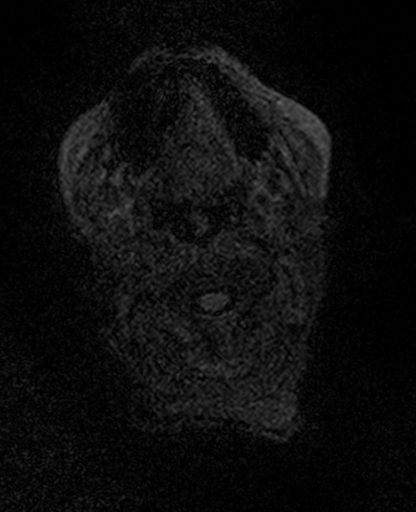
[im 10/95]
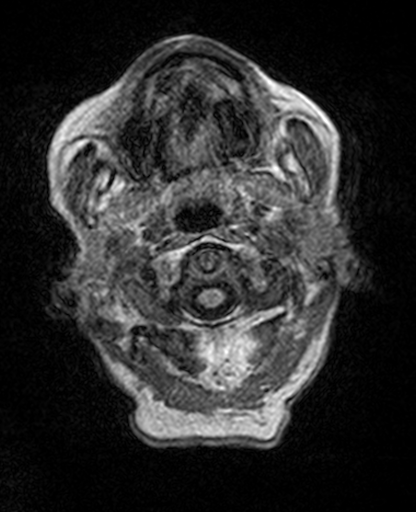
[im 19/95]
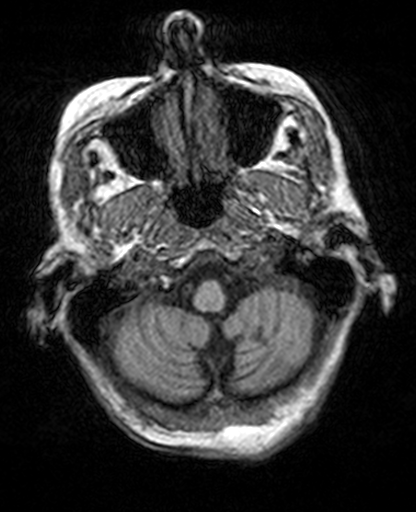
[im 29/95]
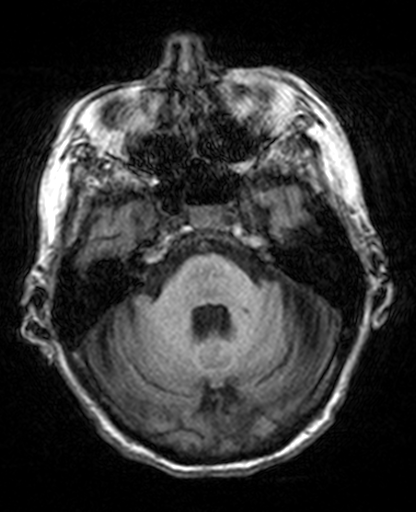
[im 38/95]
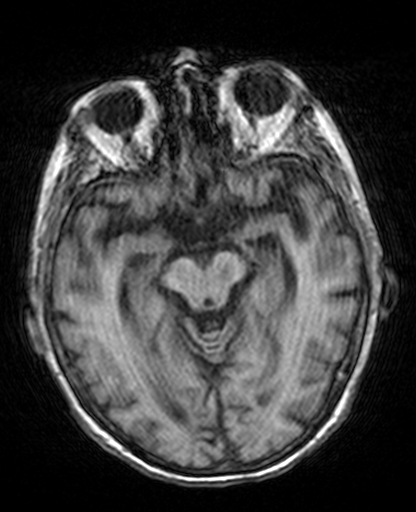
[im 48/95]
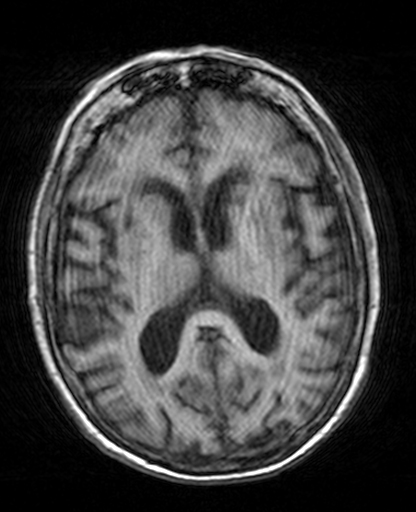
[im 57/95]
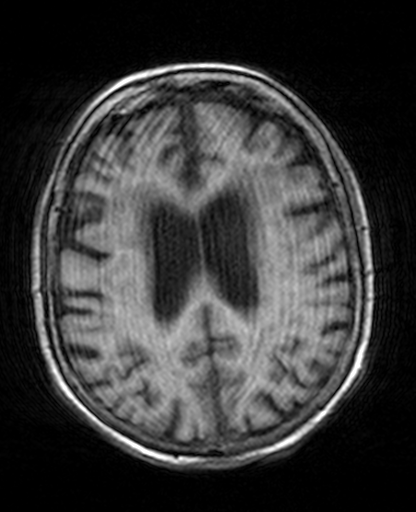
[im 66/95]
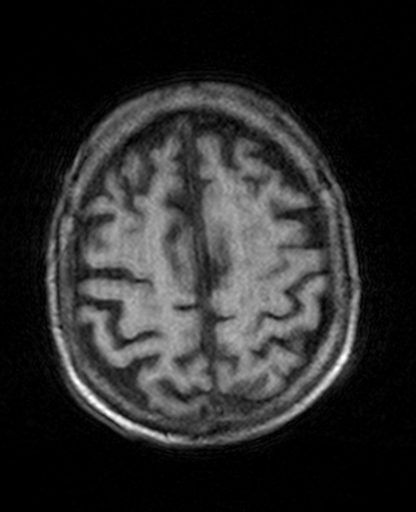
[im 76/95]
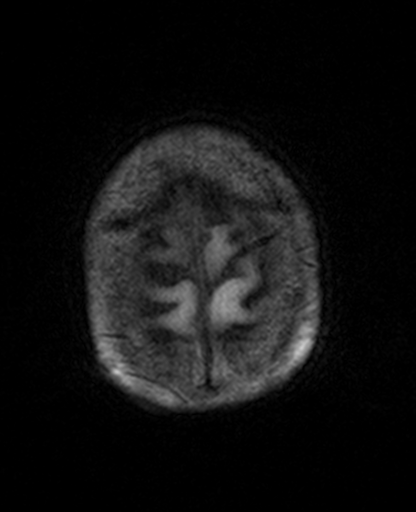
[im 85/95]
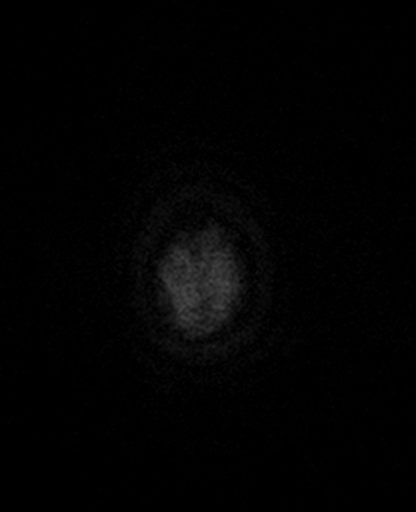
[im 95/95]
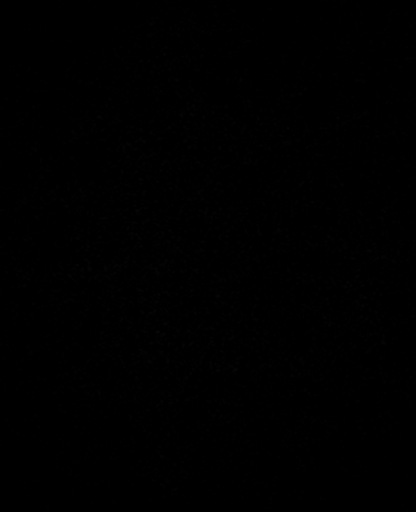

[Series 9: trauma axial · axial · 5.0mm · 0.41mm/px · z∈[-122,+6]mm · 3 of 21 slices shown]
[im 1/21]
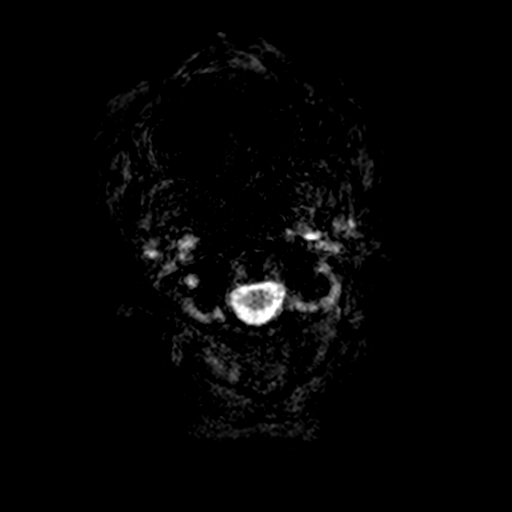
[im 11/21]
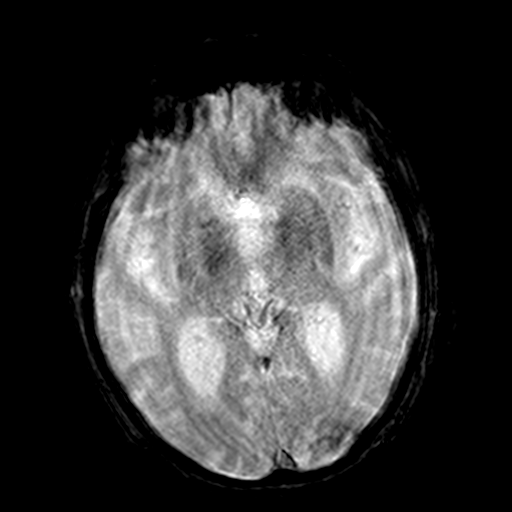
[im 21/21]
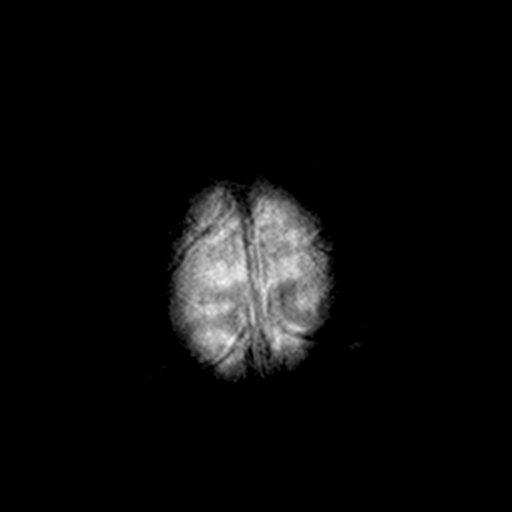

[Series 10: T2 · coronal · 5.0mm · 0.62mm/px · 3 of 28 slices shown (2 of 2)]
[im 1/28]
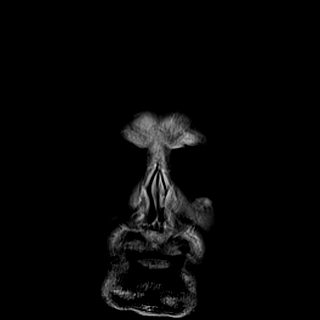
[im 14/28]
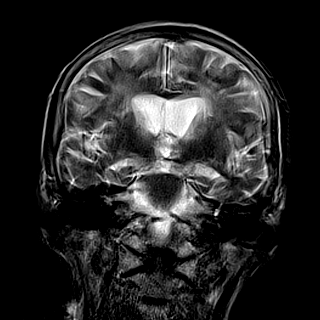
[im 28/28]
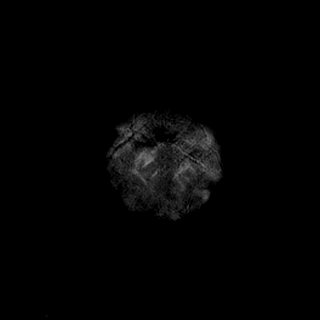

[Series 100: <mpr thick range> · axial · 3.0mm · 0.82mm/px · z∈[-122,-56]mm · 3 of 41 slices shown]
[im 1/41]
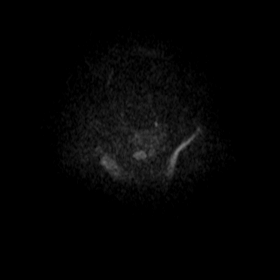
[im 11/41]
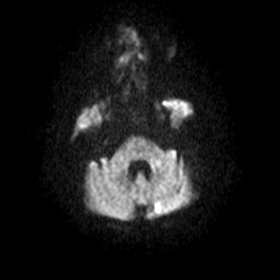
[im 21/41]
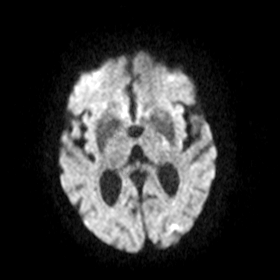

[29 of 48 positions shown; findings below may reference images not displayed]

FINDINGS: Acute nonhemorrhagic infarct is present within the medial superior
left cerebellum. The more lateral and inferior left cerebellar
infarcts are remote. These were not present in 3100. An additional
right remote cerebellar lacunar infarct is present as well.

Progressive diffuse atrophy and white matter disease is present.
There multiple remote lacunar infarcts of the basal ganglia
bilaterally. Most of these were present on the prior study.

White matter changes extend into the brainstem with some progression
since the prior exam.

Flow is present in the major intracranial arteries. Bilateral lens
replacements are noted. The globes and orbits are otherwise intact.
Mild mucosal thickening is present in the anterior paranasal
sinuses. Fluid levels are present in the sphenoid sinuses
bilaterally. The mastoid air cells are clear. The skullbase is
within normal limits. Midline sagittal images demonstrate atrophy
but no focal lesion.
IMPRESSION: 1. Acute 9 mm infarct in the posterior superior left cerebellum
without associated hemorrhage.
2. Remote lacunar infarcts of the left cerebellum are otherwise
remote. These are new since 3100 and likely new since the previous
CT of [DATE]. Progressive diffuse atrophy and white matter disease.
4. Multiple bilateral remote lacunar infarcts of the basal ganglia.
